# Patient Record
Sex: Female | Born: 1940
Health system: Southern US, Community
[De-identification: ages and names within clinical notes are randomized; demographics above are authoritative.]

## PROBLEM LIST (undated history)

## (undated) DIAGNOSIS — R0602 Shortness of breath: Secondary | ICD-10-CM

## (undated) DIAGNOSIS — I10 Essential (primary) hypertension: Secondary | ICD-10-CM

## (undated) DIAGNOSIS — K5900 Constipation, unspecified: Secondary | ICD-10-CM

## (undated) DIAGNOSIS — M1990 Unspecified osteoarthritis, unspecified site: Secondary | ICD-10-CM

## (undated) DIAGNOSIS — I499 Cardiac arrhythmia, unspecified: Secondary | ICD-10-CM

## (undated) DIAGNOSIS — Z96659 Presence of unspecified artificial knee joint: Secondary | ICD-10-CM

## (undated) HISTORY — PX: EYE SURGERY: SHX253

## (undated) HISTORY — PX: CHOLECYSTECTOMY: SHX55

## (undated) HISTORY — DX: Essential (primary) hypertension: I10

## (undated) HISTORY — PX: APPENDECTOMY: SHX54

## (undated) HISTORY — PX: TONSILLECTOMY: SUR1361

## (undated) HISTORY — PX: COLONOSCOPY: SHX174

## (undated) HISTORY — PX: CARPAL TUNNEL RELEASE: SHX101

## (undated) HISTORY — PX: OTHER SURGICAL HISTORY: SHX169

## (undated) HISTORY — DX: Presence of unspecified artificial knee joint: Z96.659

## (undated) HISTORY — DX: Cardiac arrhythmia, unspecified: I49.9

## (undated) HISTORY — DX: Unspecified osteoarthritis, unspecified site: M19.90

---

## 2011-07-19 DIAGNOSIS — M899 Disorder of bone, unspecified: Secondary | ICD-10-CM | POA: Diagnosis not present

## 2011-07-22 DIAGNOSIS — H43819 Vitreous degeneration, unspecified eye: Secondary | ICD-10-CM | POA: Diagnosis not present

## 2011-07-22 DIAGNOSIS — H2510 Age-related nuclear cataract, unspecified eye: Secondary | ICD-10-CM | POA: Diagnosis not present

## 2011-07-22 DIAGNOSIS — H25019 Cortical age-related cataract, unspecified eye: Secondary | ICD-10-CM | POA: Diagnosis not present

## 2011-07-22 DIAGNOSIS — H1045 Other chronic allergic conjunctivitis: Secondary | ICD-10-CM | POA: Diagnosis not present

## 2011-08-03 DIAGNOSIS — H25019 Cortical age-related cataract, unspecified eye: Secondary | ICD-10-CM | POA: Diagnosis not present

## 2011-08-03 DIAGNOSIS — H2510 Age-related nuclear cataract, unspecified eye: Secondary | ICD-10-CM | POA: Diagnosis not present

## 2011-08-03 DIAGNOSIS — H269 Unspecified cataract: Secondary | ICD-10-CM | POA: Diagnosis not present

## 2011-08-19 DIAGNOSIS — I10 Essential (primary) hypertension: Secondary | ICD-10-CM | POA: Diagnosis not present

## 2011-08-19 DIAGNOSIS — Z0000 Encounter for general adult medical examination without abnormal findings: Secondary | ICD-10-CM | POA: Diagnosis not present

## 2011-08-26 DIAGNOSIS — I10 Essential (primary) hypertension: Secondary | ICD-10-CM | POA: Diagnosis not present

## 2011-09-15 DIAGNOSIS — H524 Presbyopia: Secondary | ICD-10-CM | POA: Diagnosis not present

## 2011-12-10 DIAGNOSIS — Z1231 Encounter for screening mammogram for malignant neoplasm of breast: Secondary | ICD-10-CM | POA: Diagnosis not present

## 2012-01-06 DIAGNOSIS — H6980 Other specified disorders of Eustachian tube, unspecified ear: Secondary | ICD-10-CM | POA: Diagnosis not present

## 2012-01-06 DIAGNOSIS — R002 Palpitations: Secondary | ICD-10-CM | POA: Diagnosis not present

## 2012-01-11 DIAGNOSIS — R002 Palpitations: Secondary | ICD-10-CM | POA: Diagnosis not present

## 2012-01-14 DIAGNOSIS — R002 Palpitations: Secondary | ICD-10-CM | POA: Diagnosis not present

## 2012-01-21 DIAGNOSIS — R002 Palpitations: Secondary | ICD-10-CM | POA: Diagnosis not present

## 2012-01-21 DIAGNOSIS — I10 Essential (primary) hypertension: Secondary | ICD-10-CM | POA: Diagnosis not present

## 2012-02-17 DIAGNOSIS — Z23 Encounter for immunization: Secondary | ICD-10-CM | POA: Diagnosis not present

## 2012-02-17 DIAGNOSIS — R002 Palpitations: Secondary | ICD-10-CM | POA: Diagnosis not present

## 2012-02-17 DIAGNOSIS — I10 Essential (primary) hypertension: Secondary | ICD-10-CM | POA: Diagnosis not present

## 2012-02-29 DIAGNOSIS — M1610 Unilateral primary osteoarthritis, unspecified hip: Secondary | ICD-10-CM | POA: Diagnosis not present

## 2012-02-29 DIAGNOSIS — Z96649 Presence of unspecified artificial hip joint: Secondary | ICD-10-CM | POA: Diagnosis not present

## 2012-03-09 DIAGNOSIS — H2510 Age-related nuclear cataract, unspecified eye: Secondary | ICD-10-CM | POA: Diagnosis not present

## 2012-03-09 DIAGNOSIS — H25019 Cortical age-related cataract, unspecified eye: Secondary | ICD-10-CM | POA: Diagnosis not present

## 2012-03-15 DIAGNOSIS — R002 Palpitations: Secondary | ICD-10-CM | POA: Diagnosis not present

## 2012-03-15 DIAGNOSIS — R0602 Shortness of breath: Secondary | ICD-10-CM | POA: Diagnosis not present

## 2012-03-15 DIAGNOSIS — I4949 Other premature depolarization: Secondary | ICD-10-CM | POA: Diagnosis not present

## 2012-04-06 DIAGNOSIS — R0602 Shortness of breath: Secondary | ICD-10-CM | POA: Diagnosis not present

## 2012-05-06 DIAGNOSIS — M129 Arthropathy, unspecified: Secondary | ICD-10-CM | POA: Diagnosis not present

## 2012-05-06 DIAGNOSIS — R42 Dizziness and giddiness: Secondary | ICD-10-CM | POA: Diagnosis not present

## 2012-05-06 DIAGNOSIS — D649 Anemia, unspecified: Secondary | ICD-10-CM | POA: Diagnosis not present

## 2012-05-06 DIAGNOSIS — I10 Essential (primary) hypertension: Secondary | ICD-10-CM | POA: Diagnosis not present

## 2012-05-06 DIAGNOSIS — Z79899 Other long term (current) drug therapy: Secondary | ICD-10-CM | POA: Diagnosis not present

## 2012-11-23 DIAGNOSIS — I10 Essential (primary) hypertension: Secondary | ICD-10-CM | POA: Diagnosis not present

## 2013-02-27 DIAGNOSIS — Z23 Encounter for immunization: Secondary | ICD-10-CM | POA: Diagnosis not present

## 2013-02-27 DIAGNOSIS — IMO0002 Reserved for concepts with insufficient information to code with codable children: Secondary | ICD-10-CM | POA: Diagnosis not present

## 2013-02-27 DIAGNOSIS — I10 Essential (primary) hypertension: Secondary | ICD-10-CM | POA: Diagnosis not present

## 2013-02-27 DIAGNOSIS — Z0000 Encounter for general adult medical examination without abnormal findings: Secondary | ICD-10-CM | POA: Diagnosis not present

## 2013-02-27 DIAGNOSIS — I4949 Other premature depolarization: Secondary | ICD-10-CM | POA: Diagnosis not present

## 2013-02-27 DIAGNOSIS — M959 Acquired deformity of musculoskeletal system, unspecified: Secondary | ICD-10-CM | POA: Diagnosis not present

## 2013-02-27 DIAGNOSIS — M1710 Unilateral primary osteoarthritis, unspecified knee: Secondary | ICD-10-CM | POA: Diagnosis not present

## 2013-02-27 DIAGNOSIS — Z1239 Encounter for other screening for malignant neoplasm of breast: Secondary | ICD-10-CM | POA: Diagnosis not present

## 2013-03-06 DIAGNOSIS — M1710 Unilateral primary osteoarthritis, unspecified knee: Secondary | ICD-10-CM | POA: Diagnosis not present

## 2013-03-06 DIAGNOSIS — IMO0002 Reserved for concepts with insufficient information to code with codable children: Secondary | ICD-10-CM | POA: Diagnosis not present

## 2013-03-06 DIAGNOSIS — M25569 Pain in unspecified knee: Secondary | ICD-10-CM | POA: Diagnosis not present

## 2013-03-10 ENCOUNTER — Encounter: Payer: Self-pay | Admitting: *Deleted

## 2013-03-10 ENCOUNTER — Encounter: Payer: Self-pay | Admitting: Interventional Cardiology

## 2013-03-13 ENCOUNTER — Ambulatory Visit (INDEPENDENT_AMBULATORY_CARE_PROVIDER_SITE_OTHER): Payer: Medicare Other | Admitting: Interventional Cardiology

## 2013-03-13 ENCOUNTER — Encounter: Payer: Self-pay | Admitting: Interventional Cardiology

## 2013-03-13 VITALS — BP 142/80 | HR 64 | Ht 63.5 in | Wt 155.1 lb

## 2013-03-13 DIAGNOSIS — I10 Essential (primary) hypertension: Secondary | ICD-10-CM | POA: Diagnosis not present

## 2013-03-13 DIAGNOSIS — I493 Ventricular premature depolarization: Secondary | ICD-10-CM | POA: Insufficient documentation

## 2013-03-13 DIAGNOSIS — I498 Other specified cardiac arrhythmias: Secondary | ICD-10-CM

## 2013-03-13 DIAGNOSIS — I4949 Other premature depolarization: Secondary | ICD-10-CM | POA: Diagnosis not present

## 2013-03-13 DIAGNOSIS — Z01810 Encounter for preprocedural cardiovascular examination: Secondary | ICD-10-CM | POA: Diagnosis not present

## 2013-03-13 NOTE — Progress Notes (Signed)
Patient ID: Maria Rowland, female   DOB: 09/17/1940, 72 y.o.   MRN: [id number]    [address] [place], [place]  [address] Phone: [phone] Fax:  [phone]  Date:  03/13/2013   ID:  Maria Rowland, DOB [birth year], MRN [id number]  PCP:  No primary provider on file.      History of Present Illness: Maria Rowland is a 72 y.o. female who has had PVCs over the past many years. SHe has had lightheadedness intermittently. In August 2013, she had an episode while standing in line. She had been drinking a lot of coffee at the time. She has decreased caffeine intake since that time. She has less PVCs as well with less caffeine. She has had some SHOB as well. That comes on with heat and sudden exercise. Better if the weather is cool. No chest pain.  No sx of PVCs anymore , since stopping caffeine.  She walks a little without CP or SHOB.  She walks up stairs without any cardiac sx, but is limited most by joints.      Wt Readings from Last 3 Encounters:  03/13/13 155 lb 1.9 oz (70.362 kg)     Past Medical History  Diagnosis Date  . HTN (hypertension)   . Irregular heart beat   . Osteoarthritis     Current Outpatient Prescriptions  Medication Sig Dispense Refill  . Acetaminophen (TYLENOL ARTHRITIS EXT RELIEF PO) Take by mouth as needed.      . calcium-vitamin D 250-100 MG-UNIT per tablet Take 1 tablet by mouth 2 (two) times daily.      . metoprolol succinate (TOPROL-XL) 50 MG 24 hr tablet       . Multiple Vitamins-Minerals (CENTRUM SILVER ADULT 50+ PO) Take by mouth daily.      . psyllium (METAMUCIL) 58.6 % powder Take 1 packet by mouth 2 (two) times daily.       No current facility-administered medications for this visit.    Allergies:    Allergies  Allergen Reactions  . Mobic [Meloxicam]      transaminitis     Social History:  The patient  reports that she has never smoked. She does not have any smokeless tobacco history on file.   Family History:  The patient's  family history includes Arthritis in her brother; COPD in her brother; Cancer - Prostate in her father; Hypertension in her mother.   ROS:  Please see the history of present illness.  No nausea, vomiting.  No fevers, chills.  No focal weakness.  No dysuria.    All other systems reviewed and negative.   PHYSICAL EXAM: VS:  BP 142/80  Pulse 64  Ht 5' 3.5" (1.613 m)  Wt 155 lb 1.9 oz (70.362 kg)  BMI 27.04 kg/m2 Well nourished, well developed, in no acute distress HEENT: normal Neck: no JVD, no carotid bruits Cardiac:  normal S1, S2; RRR;  Lungs:  clear to auscultation bilaterally, no wheezing, rhonchi or rales Abd: soft, nontender, no hepatomegaly Ext: no edema Skin: warm and dry Neuro:   no focal abnormalities noted  EKG: Normal     ASSESSMENT AND PLAN:  1.  Preop evaluation:  Needs knee replacement.  ECHO from 11/13 was reviewed and was essentially normal.  No sx with walking up the stairs.  Form filled out.  No further cardiac testing needed prior to surgery. 2. PVCs : Reduced with less caffeine. 3.  PAT: documented on monitor.  No sustained sx.  COntinue  metoprolol during the perioperative period.    Signed, Fredric Mare, MD, [facility] 03/13/2013 10:27 AM

## 2013-03-13 NOTE — Patient Instructions (Signed)
Your physician wants you to follow-up in: 1 year follow up with Dr. Varanasi. You will receive a reminder letter in the mail two months in advance. If you don't receive a letter, please call our office to schedule the follow-up appointment.  Your physician recommends that you continue on your current medications as directed. Please refer to the Current Medication list given to you today.  

## 2013-03-22 DIAGNOSIS — H2510 Age-related nuclear cataract, unspecified eye: Secondary | ICD-10-CM | POA: Diagnosis not present

## 2013-03-22 DIAGNOSIS — H25019 Cortical age-related cataract, unspecified eye: Secondary | ICD-10-CM | POA: Diagnosis not present

## 2013-03-22 DIAGNOSIS — H43819 Vitreous degeneration, unspecified eye: Secondary | ICD-10-CM | POA: Diagnosis not present

## 2013-03-26 ENCOUNTER — Other Ambulatory Visit: Payer: Self-pay | Admitting: Orthopedic Surgery

## 2013-03-27 ENCOUNTER — Encounter (HOSPITAL_COMMUNITY): Payer: Self-pay | Admitting: Pharmacy Technician

## 2013-03-30 DIAGNOSIS — M1710 Unilateral primary osteoarthritis, unspecified knee: Secondary | ICD-10-CM | POA: Diagnosis not present

## 2013-03-30 DIAGNOSIS — IMO0002 Reserved for concepts with insufficient information to code with codable children: Secondary | ICD-10-CM | POA: Diagnosis not present

## 2013-04-03 ENCOUNTER — Encounter (HOSPITAL_COMMUNITY)
Admission: RE | Admit: 2013-04-03 | Discharge: 2013-04-03 | Disposition: A | Discharge status: Home / Self Care | Payer: Medicare Other | Source: Ambulatory Visit | Attending: Orthopedic Surgery | Admitting: Orthopedic Surgery

## 2013-04-03 ENCOUNTER — Other Ambulatory Visit (HOSPITAL_COMMUNITY): Payer: Self-pay | Admitting: *Deleted

## 2013-04-03 ENCOUNTER — Encounter (HOSPITAL_COMMUNITY): Payer: Self-pay

## 2013-04-03 DIAGNOSIS — Z01812 Encounter for preprocedural laboratory examination: Secondary | ICD-10-CM | POA: Diagnosis not present

## 2013-04-03 DIAGNOSIS — Z01818 Encounter for other preprocedural examination: Secondary | ICD-10-CM | POA: Diagnosis not present

## 2013-04-03 HISTORY — DX: Shortness of breath: R06.02

## 2013-04-03 HISTORY — DX: Constipation, unspecified: K59.00

## 2013-04-03 LAB — CBC WITH DIFFERENTIAL/PLATELET
Basophils Absolute: 0 K/uL (ref 0.0–0.1)
Basophils Relative: 1 % (ref 0–1)
Eosinophils Absolute: 0.2 K/uL (ref 0.0–0.7)
Eosinophils Relative: 3 % (ref 0–5)
HCT: 36.1 % (ref 36.0–46.0)
Hemoglobin: 12.4 g/dL (ref 12.0–15.0)
Lymphocytes Relative: 39 % (ref 12–46)
Lymphs Abs: 2.9 K/uL (ref 0.7–4.0)
MCH: 31.7 pg (ref 26.0–34.0)
MCHC: 34.3 g/dL (ref 30.0–36.0)
MCV: 92.3 fL (ref 78.0–100.0)
Monocytes Absolute: 0.6 K/uL (ref 0.1–1.0)
Monocytes Relative: 8 % (ref 3–12)
Neutro Abs: 3.7 K/uL (ref 1.7–7.7)
Neutrophils Relative %: 50 % (ref 43–77)
Platelets: 288 K/uL (ref 150–400)
RBC: 3.91 MIL/uL (ref 3.87–5.11)
RDW: 13.5 % (ref 11.5–15.5)
WBC: 7.5 K/uL (ref 4.0–10.5)

## 2013-04-03 LAB — URINALYSIS, ROUTINE W REFLEX MICROSCOPIC
Bilirubin Urine: NEGATIVE
Glucose, UA: NEGATIVE mg/dL
Hgb urine dipstick: NEGATIVE
Ketones, ur: NEGATIVE mg/dL
Leukocytes, UA: NEGATIVE
Nitrite: NEGATIVE
Protein, ur: NEGATIVE mg/dL
Specific Gravity, Urine: 1.006 (ref 1.005–1.030)
Urobilinogen, UA: 0.2 mg/dL (ref 0.0–1.0)
pH: 6 (ref 5.0–8.0)

## 2013-04-03 LAB — COMPREHENSIVE METABOLIC PANEL WITH GFR
ALT: 36 U/L — ABNORMAL HIGH (ref 0–35)
AST: 31 U/L (ref 0–37)
Albumin: 4 g/dL (ref 3.5–5.2)
Alkaline Phosphatase: 83 U/L (ref 39–117)
BUN: 17 mg/dL (ref 6–23)
CO2: 27 meq/L (ref 19–32)
Calcium: 9.5 mg/dL (ref 8.4–10.5)
Chloride: 100 meq/L (ref 96–112)
Creatinine, Ser: 0.8 mg/dL (ref 0.50–1.10)
GFR calc Af Amer: 84 mL/min — ABNORMAL LOW (ref >90)
GFR calc non Af Amer: 72 mL/min — ABNORMAL LOW (ref >90)
Glucose, Bld: 90 mg/dL (ref 70–99)
Potassium: 3.8 meq/L (ref 3.5–5.1)
Sodium: 137 meq/L (ref 135–145)
Total Bilirubin: 0.3 mg/dL (ref 0.3–1.2)
Total Protein: 7.2 g/dL (ref 6.0–8.3)

## 2013-04-03 LAB — TYPE AND SCREEN
ABO/RH(D): O POS
Antibody Screen: NEGATIVE

## 2013-04-03 LAB — PROTIME-INR
INR: 0.97 (ref 0.00–1.49)
Prothrombin Time: 12.7 seconds (ref 11.6–15.2)

## 2013-04-03 LAB — APTT: aPTT: 30 seconds (ref 24–37)

## 2013-04-03 LAB — SURGICAL PCR SCREEN
MRSA, PCR: NEGATIVE
Staphylococcus aureus: NEGATIVE

## 2013-04-03 LAB — ABO/RH: ABO/RH(D): O POS

## 2013-04-03 NOTE — Pre-Procedure Instructions (Signed)
Maria Rowland  04/03/2013   Your procedure is scheduled on:  Monday, April 09, 2013 at 8:45 AM.   Report to [facility] Entrance "A" at 6:45 AM.   Call this number if you have problems the morning of surgery: [phone]   Remember:   Do not eat food or drink liquids after midnight Sunday, 04/08/13.  Take these medicines the morning of surgery with A SIP OF WATER: metoprolol succinate (TOPROL-XL), acetaminophen (TYLENOL)  Stop all Vitamins as of today, 04/03/13.     Do not wear jewelry, make-up or nail polish.  Do not wear lotions, powders, or perfumes. You may wear deodorant.  Do not shave 48 hours prior to surgery.   Do not bring valuables to the hospital.  [facility] is not responsible                  for any belongings or valuables.               Contacts, dentures or bridgework may not be worn into surgery.  Leave suitcase in the car. After surgery it may be brought to your room.  For patients admitted to the hospital, discharge time is determined by your                treatment team.              Special Instructions: Shower using CHG 2 nights before surgery and the night before surgery.  If you shower the day of surgery use CHG.  Use special wash - you have one bottle of CHG for all showers.  You should use approximately 1/3 of the bottle for each shower.   Please read over the following fact sheets that you were given: Pain Booklet, Coughing and Deep Breathing, Blood Transfusion Information, MRSA Information and Surgical Site Infection Prevention

## 2013-04-08 MED ORDER — CEFAZOLIN SODIUM-DEXTROSE 2-3 GM-% IV SOLR
2.0000 g | INTRAVENOUS | Status: AC
  Administered 2013-04-09: 2 g via INTRAVENOUS
  Filled 2013-04-08: qty 50

## 2013-04-09 ENCOUNTER — Inpatient Hospital Stay (HOSPITAL_COMMUNITY)
Admission: RE | Admit: 2013-04-09 | Discharge: 2013-04-12 | DRG: 470 | Disposition: A | Discharge status: Skilled Nursing Facility | Payer: Medicare Other | Source: Ambulatory Visit | Attending: Orthopedic Surgery | Admitting: Orthopedic Surgery

## 2013-04-09 ENCOUNTER — Encounter (HOSPITAL_COMMUNITY)
Admission: RE | Disposition: A | Discharge status: Skilled Nursing Facility | Payer: Self-pay | Source: Ambulatory Visit | Attending: Orthopedic Surgery

## 2013-04-09 ENCOUNTER — Encounter (HOSPITAL_COMMUNITY): Payer: Medicare Other | Admitting: Anesthesiology

## 2013-04-09 ENCOUNTER — Encounter (HOSPITAL_COMMUNITY): Payer: Self-pay | Admitting: Surgery

## 2013-04-09 ENCOUNTER — Inpatient Hospital Stay (HOSPITAL_COMMUNITY): Payer: Medicare Other | Admitting: Anesthesiology

## 2013-04-09 DIAGNOSIS — Z8261 Family history of arthritis: Secondary | ICD-10-CM

## 2013-04-09 DIAGNOSIS — I10 Essential (primary) hypertension: Secondary | ICD-10-CM | POA: Diagnosis not present

## 2013-04-09 DIAGNOSIS — M1990 Unspecified osteoarthritis, unspecified site: Secondary | ICD-10-CM | POA: Diagnosis not present

## 2013-04-09 DIAGNOSIS — M6281 Muscle weakness (generalized): Secondary | ICD-10-CM | POA: Diagnosis not present

## 2013-04-09 DIAGNOSIS — M1710 Unilateral primary osteoarthritis, unspecified knee: Secondary | ICD-10-CM | POA: Diagnosis not present

## 2013-04-09 DIAGNOSIS — Z471 Aftercare following joint replacement surgery: Secondary | ICD-10-CM | POA: Diagnosis not present

## 2013-04-09 DIAGNOSIS — G8918 Other acute postprocedural pain: Secondary | ICD-10-CM | POA: Diagnosis not present

## 2013-04-09 DIAGNOSIS — I4891 Unspecified atrial fibrillation: Secondary | ICD-10-CM | POA: Diagnosis not present

## 2013-04-09 DIAGNOSIS — R269 Unspecified abnormalities of gait and mobility: Secondary | ICD-10-CM | POA: Diagnosis not present

## 2013-04-09 DIAGNOSIS — Z7901 Long term (current) use of anticoagulants: Secondary | ICD-10-CM

## 2013-04-09 DIAGNOSIS — Z96659 Presence of unspecified artificial knee joint: Secondary | ICD-10-CM | POA: Diagnosis not present

## 2013-04-09 DIAGNOSIS — Z8249 Family history of ischemic heart disease and other diseases of the circulatory system: Secondary | ICD-10-CM

## 2013-04-09 DIAGNOSIS — Z96649 Presence of unspecified artificial hip joint: Secondary | ICD-10-CM

## 2013-04-09 DIAGNOSIS — Z9089 Acquired absence of other organs: Secondary | ICD-10-CM

## 2013-04-09 DIAGNOSIS — IMO0002 Reserved for concepts with insufficient information to code with codable children: Secondary | ICD-10-CM | POA: Diagnosis not present

## 2013-04-09 DIAGNOSIS — Z8042 Family history of malignant neoplasm of prostate: Secondary | ICD-10-CM | POA: Diagnosis not present

## 2013-04-09 DIAGNOSIS — D62 Acute posthemorrhagic anemia: Secondary | ICD-10-CM | POA: Diagnosis not present

## 2013-04-09 DIAGNOSIS — Z79899 Other long term (current) drug therapy: Secondary | ICD-10-CM

## 2013-04-09 DIAGNOSIS — Z836 Family history of other diseases of the respiratory system: Secondary | ICD-10-CM | POA: Diagnosis not present

## 2013-04-09 DIAGNOSIS — Z96652 Presence of left artificial knee joint: Secondary | ICD-10-CM

## 2013-04-09 DIAGNOSIS — Z5189 Encounter for other specified aftercare: Secondary | ICD-10-CM | POA: Diagnosis not present

## 2013-04-09 DIAGNOSIS — M25569 Pain in unspecified knee: Secondary | ICD-10-CM | POA: Diagnosis not present

## 2013-04-09 HISTORY — PX: TOTAL KNEE ARTHROPLASTY: SHX125

## 2013-04-09 LAB — CBC
HCT: 30.3 % — ABNORMAL LOW (ref 36.0–46.0)
Hemoglobin: 10.9 g/dL — ABNORMAL LOW (ref 12.0–15.0)
MCH: 32.2 pg (ref 26.0–34.0)
MCHC: 36 g/dL (ref 30.0–36.0)
MCV: 89.6 fL (ref 78.0–100.0)
Platelets: 236 10*3/uL (ref 150–400)
RBC: 3.38 MIL/uL — ABNORMAL LOW (ref 3.87–5.11)
RDW: 13.2 % (ref 11.5–15.5)
WBC: 8.5 10*3/uL (ref 4.0–10.5)

## 2013-04-09 LAB — CREATININE, SERUM
Creatinine, Ser: 0.63 mg/dL (ref 0.50–1.10)
GFR calc Af Amer: >90 mL/min (ref >90)
GFR calc non Af Amer: 88 mL/min — ABNORMAL LOW (ref >90)

## 2013-04-09 SURGERY — ARTHROPLASTY, KNEE, TOTAL
Anesthesia: General | Site: Knee | Laterality: Right | Wound class: Clean

## 2013-04-09 MED ORDER — TRANEXAMIC ACID 100 MG/ML IV SOLN
1000.0000 mg | INTRAVENOUS | Status: AC
  Administered 2013-04-09: 1000 mg via INTRAVENOUS
  Filled 2013-04-09: qty 10

## 2013-04-09 MED ORDER — DOCUSATE SODIUM 100 MG PO CAPS
100.0000 mg | ORAL_CAPSULE | Freq: Two times a day (BID) | ORAL | Status: DC
  Administered 2013-04-09 – 2013-04-12 (×7): 100 mg via ORAL
  Filled 2013-04-09 (×8): qty 1

## 2013-04-09 MED ORDER — ALUM & MAG HYDROXIDE-SIMETH 200-200-20 MG/5ML PO SUSP: 30.0000 mL | ORAL | Status: DC | PRN

## 2013-04-09 MED ORDER — OXYCODONE HCL 5 MG PO TABS
ORAL_TABLET | ORAL | Status: AC
  Filled 2013-04-09: qty 1

## 2013-04-09 MED ORDER — METHOCARBAMOL 100 MG/ML IJ SOLN
500.0000 mg | Freq: Four times a day (QID) | INTRAVENOUS | Status: DC | PRN
  Filled 2013-04-09: qty 5

## 2013-04-09 MED ORDER — OXYCODONE HCL 5 MG PO TABS
5.0000 mg | ORAL_TABLET | ORAL | Status: DC | PRN
  Administered 2013-04-09 – 2013-04-12 (×14): 10 mg via ORAL
  Filled 2013-04-09 (×14): qty 2

## 2013-04-09 MED ORDER — DIPHENHYDRAMINE HCL 12.5 MG/5ML PO ELIX: 12.5000 mg | ORAL_SOLUTION | ORAL | Status: DC | PRN

## 2013-04-09 MED ORDER — METOPROLOL SUCCINATE ER 50 MG PO TB24
50.0000 mg | ORAL_TABLET | Freq: Every day | ORAL | Status: DC
  Administered 2013-04-10 – 2013-04-12 (×3): 50 mg via ORAL
  Filled 2013-04-09 (×3): qty 1

## 2013-04-09 MED ORDER — SODIUM CHLORIDE 0.9 % IV SOLN
INTRAVENOUS | Status: DC
  Administered 2013-04-09: 1 mL via INTRAVENOUS
  Administered 2013-04-10 – 2013-04-11 (×2): via INTRAVENOUS

## 2013-04-09 MED ORDER — ENOXAPARIN SODIUM 30 MG/0.3ML ~~LOC~~ SOLN
30.0000 mg | Freq: Two times a day (BID) | SUBCUTANEOUS | Status: DC
  Administered 2013-04-10 – 2013-04-12 (×5): 30 mg via SUBCUTANEOUS
  Filled 2013-04-09 (×7): qty 0.3

## 2013-04-09 MED ORDER — HYDROMORPHONE HCL PF 1 MG/ML IJ SOLN
1.0000 mg | INTRAMUSCULAR | Status: DC | PRN
  Administered 2013-04-10: 1 mg via INTRAVENOUS
  Filled 2013-04-09: qty 1

## 2013-04-09 MED ORDER — SENNOSIDES-DOCUSATE SODIUM 8.6-50 MG PO TABS: 1.0000 | ORAL_TABLET | Freq: Every evening | ORAL | Status: DC | PRN

## 2013-04-09 MED ORDER — MIDAZOLAM HCL 2 MG/2ML IJ SOLN: 2.0000 mg | Freq: Once | INTRAMUSCULAR | Status: DC

## 2013-04-09 MED ORDER — ACETAMINOPHEN 650 MG RE SUPP: 650.0000 mg | Freq: Four times a day (QID) | RECTAL | Status: DC | PRN

## 2013-04-09 MED ORDER — FENTANYL CITRATE 0.05 MG/ML IJ SOLN
50.0000 ug | Freq: Once | INTRAMUSCULAR | Status: AC
  Administered 2013-04-09: 50 ug via INTRAVENOUS

## 2013-04-09 MED ORDER — ACETAMINOPHEN 325 MG PO TABS
650.0000 mg | ORAL_TABLET | Freq: Four times a day (QID) | ORAL | Status: DC | PRN
  Administered 2013-04-11 – 2013-04-12 (×3): 650 mg via ORAL
  Filled 2013-04-09 (×3): qty 2

## 2013-04-09 MED ORDER — PROPOFOL 10 MG/ML IV BOLUS
INTRAVENOUS | Status: DC | PRN
  Administered 2013-04-09: 170 mg via INTRAVENOUS

## 2013-04-09 MED ORDER — CHLORHEXIDINE GLUCONATE 4 % EX LIQD: 60.0000 mL | Freq: Once | CUTANEOUS | Status: DC

## 2013-04-09 MED ORDER — MIDAZOLAM HCL 2 MG/2ML IJ SOLN
INTRAMUSCULAR | Status: AC
  Administered 2013-04-09: 2 mg
  Filled 2013-04-09: qty 2

## 2013-04-09 MED ORDER — ACETAMINOPHEN 500 MG PO TABS
1000.0000 mg | ORAL_TABLET | Freq: Four times a day (QID) | ORAL | Status: AC
  Administered 2013-04-09 – 2013-04-10 (×4): 1000 mg via ORAL
  Filled 2013-04-09 (×4): qty 2

## 2013-04-09 MED ORDER — PHENOL 1.4 % MT LIQD: 1.0000 | OROMUCOSAL | Status: DC | PRN

## 2013-04-09 MED ORDER — BISACODYL 5 MG PO TBEC: 5.0000 mg | DELAYED_RELEASE_TABLET | Freq: Every day | ORAL | Status: DC | PRN

## 2013-04-09 MED ORDER — FENTANYL CITRATE 0.05 MG/ML IJ SOLN
INTRAMUSCULAR | Status: AC
  Administered 2013-04-09: 50 ug via INTRAVENOUS
  Filled 2013-04-09: qty 2

## 2013-04-09 MED ORDER — BUPIVACAINE-EPINEPHRINE PF 0.5-1:200000 % IJ SOLN
INTRAMUSCULAR | Status: DC | PRN
  Administered 2013-04-09: 30 mL

## 2013-04-09 MED ORDER — BUPIVACAINE LIPOSOME 1.3 % IJ SUSP
20.0000 mL | Freq: Once | INTRAMUSCULAR | Status: DC
  Filled 2013-04-09: qty 20

## 2013-04-09 MED ORDER — LIDOCAINE HCL (CARDIAC) 20 MG/ML IV SOLN
INTRAVENOUS | Status: DC | PRN
  Administered 2013-04-09: 40 mg via INTRAVENOUS

## 2013-04-09 MED ORDER — ONDANSETRON HCL 4 MG PO TABS: 4.0000 mg | ORAL_TABLET | Freq: Four times a day (QID) | ORAL | Status: DC | PRN

## 2013-04-09 MED ORDER — SODIUM CHLORIDE 0.9 % IV SOLN: INTRAVENOUS | Status: DC

## 2013-04-09 MED ORDER — ONDANSETRON HCL 4 MG/2ML IJ SOLN
4.0000 mg | Freq: Four times a day (QID) | INTRAMUSCULAR | Status: DC | PRN
  Administered 2013-04-09 – 2013-04-10 (×2): 4 mg via INTRAVENOUS
  Filled 2013-04-09 (×2): qty 2

## 2013-04-09 MED ORDER — OXYCODONE HCL 5 MG PO TABS
5.0000 mg | ORAL_TABLET | Freq: Once | ORAL | Status: AC | PRN
  Administered 2013-04-09: 5 mg via ORAL

## 2013-04-09 MED ORDER — ZOLPIDEM TARTRATE 5 MG PO TABS: 5.0000 mg | ORAL_TABLET | Freq: Every evening | ORAL | Status: DC | PRN

## 2013-04-09 MED ORDER — OXYCODONE HCL ER 10 MG PO T12A
10.0000 mg | EXTENDED_RELEASE_TABLET | Freq: Two times a day (BID) | ORAL | Status: DC
  Administered 2013-04-09 – 2013-04-11 (×5): 10 mg via ORAL
  Filled 2013-04-09 (×5): qty 1

## 2013-04-09 MED ORDER — METOCLOPRAMIDE HCL 10 MG PO TABS: 5.0000 mg | ORAL_TABLET | Freq: Three times a day (TID) | ORAL | Status: DC | PRN

## 2013-04-09 MED ORDER — FLEET ENEMA 7-19 GM/118ML RE ENEM: 1.0000 | ENEMA | Freq: Once | RECTAL | Status: AC | PRN

## 2013-04-09 MED ORDER — MIDAZOLAM HCL 5 MG/5ML IJ SOLN
INTRAMUSCULAR | Status: DC | PRN
  Administered 2013-04-09: 1 mg via INTRAVENOUS

## 2013-04-09 MED ORDER — MENTHOL 3 MG MT LOZG: 1.0000 | LOZENGE | OROMUCOSAL | Status: DC | PRN

## 2013-04-09 MED ORDER — PHENYLEPHRINE HCL 10 MG/ML IJ SOLN
INTRAMUSCULAR | Status: DC | PRN
  Administered 2013-04-09: 80 ug via INTRAVENOUS
  Administered 2013-04-09 (×2): 40 ug via INTRAVENOUS
  Administered 2013-04-09 (×3): 80 ug via INTRAVENOUS

## 2013-04-09 MED ORDER — BUPIVACAINE LIPOSOME 1.3 % IJ SUSP
INTRAMUSCULAR | Status: DC | PRN
  Administered 2013-04-09: 20 mL

## 2013-04-09 MED ORDER — OXYCODONE HCL 5 MG/5ML PO SOLN
5.0000 mg | Freq: Once | ORAL | Status: AC | PRN
Start: 2013-04-09 — End: 2013-04-09

## 2013-04-09 MED ORDER — HYDROMORPHONE HCL PF 1 MG/ML IJ SOLN
INTRAMUSCULAR | Status: AC
  Filled 2013-04-09: qty 1

## 2013-04-09 MED ORDER — CEFAZOLIN SODIUM 1-5 GM-% IV SOLN
1.0000 g | Freq: Four times a day (QID) | INTRAVENOUS | Status: AC
  Administered 2013-04-09 (×2): 1 g via INTRAVENOUS
  Filled 2013-04-09 (×3): qty 50

## 2013-04-09 MED ORDER — FENTANYL CITRATE 0.05 MG/ML IJ SOLN
INTRAMUSCULAR | Status: DC | PRN
  Administered 2013-04-09: 50 ug via INTRAVENOUS
  Administered 2013-04-09: 100 ug via INTRAVENOUS

## 2013-04-09 MED ORDER — METHOCARBAMOL 500 MG PO TABS
500.0000 mg | ORAL_TABLET | Freq: Four times a day (QID) | ORAL | Status: DC | PRN
  Administered 2013-04-09 – 2013-04-11 (×5): 500 mg via ORAL
  Filled 2013-04-09 (×5): qty 1

## 2013-04-09 MED ORDER — ONDANSETRON HCL 4 MG/2ML IJ SOLN: 4.0000 mg | Freq: Once | INTRAMUSCULAR | Status: DC | PRN

## 2013-04-09 MED ORDER — MEPERIDINE HCL 25 MG/ML IJ SOLN
INTRAMUSCULAR | Status: AC
  Filled 2013-04-09: qty 1

## 2013-04-09 MED ORDER — LACTATED RINGERS IV SOLN
INTRAVENOUS | Status: DC
  Administered 2013-04-09 (×2): via INTRAVENOUS

## 2013-04-09 MED ORDER — HYDROMORPHONE HCL PF 1 MG/ML IJ SOLN
0.2500 mg | INTRAMUSCULAR | Status: DC | PRN
  Administered 2013-04-09 (×4): 0.5 mg via INTRAVENOUS

## 2013-04-09 MED ORDER — SODIUM CHLORIDE 0.9 % IR SOLN
Status: DC | PRN
  Administered 2013-04-09: 3000 mL

## 2013-04-09 MED ORDER — BUPIVACAINE-EPINEPHRINE (PF) 0.5% -1:200000 IJ SOLN
INTRAMUSCULAR | Status: AC
  Filled 2013-04-09: qty 10

## 2013-04-09 MED ORDER — ONDANSETRON HCL 4 MG/2ML IJ SOLN
INTRAMUSCULAR | Status: DC | PRN
  Administered 2013-04-09: 4 mg via INTRAVENOUS

## 2013-04-09 MED ORDER — METOCLOPRAMIDE HCL 5 MG/ML IJ SOLN
5.0000 mg | Freq: Three times a day (TID) | INTRAMUSCULAR | Status: DC | PRN
  Administered 2013-04-10: 10 mg via INTRAVENOUS
  Filled 2013-04-09: qty 2

## 2013-04-09 MED ORDER — MEPERIDINE HCL 25 MG/ML IJ SOLN
6.2500 mg | INTRAMUSCULAR | Status: DC | PRN
  Administered 2013-04-09 (×2): 6.25 mg via INTRAVENOUS

## 2013-04-09 SURGICAL SUPPLY — 54 items
BANDAGE ESMARK 6X9 LF (GAUZE/BANDAGES/DRESSINGS) ×1 IMPLANT
BLADE SAGITTAL 13X1.27X60 (BLADE) ×2 IMPLANT
BLADE SAW SGTL 83.5X18.5 (BLADE) ×2 IMPLANT
BNDG ESMARK 6X9 LF (GAUZE/BANDAGES/DRESSINGS) ×2
BOWL SMART MIX CTS (DISPOSABLE) ×2 IMPLANT
CAP POR TM CP VIT E LN CER HD ×2 IMPLANT
CEMENT BONE SIMPLEX SPEEDSET (Cement) ×4 IMPLANT
CLOTH BEACON ORANGE TIMEOUT ST (SAFETY) ×2 IMPLANT
COVER SURGICAL LIGHT HANDLE (MISCELLANEOUS) ×2 IMPLANT
CUFF TOURNIQUET SINGLE 34IN LL (TOURNIQUET CUFF) ×2 IMPLANT
DRAPE EXTREMITY T 121X128X90 (DRAPE) ×2 IMPLANT
DRAPE INCISE IOBAN 66X45 STRL (DRAPES) ×4 IMPLANT
DRAPE PROXIMA HALF (DRAPES) ×2 IMPLANT
DRAPE U-SHAPE 47X51 STRL (DRAPES) ×2 IMPLANT
DRSG ADAPTIC 3X8 NADH LF (GAUZE/BANDAGES/DRESSINGS) ×2 IMPLANT
DRSG PAD ABDOMINAL 8X10 ST (GAUZE/BANDAGES/DRESSINGS) ×2 IMPLANT
DURAPREP 26ML APPLICATOR (WOUND CARE) ×4 IMPLANT
ELECT REM PT RETURN 9FT ADLT (ELECTROSURGICAL) ×2
ELECTRODE REM PT RTRN 9FT ADLT (ELECTROSURGICAL) ×1 IMPLANT
EVACUATOR 1/8 PVC DRAIN (DRAIN) ×2 IMPLANT
GLOVE BIOGEL M 7.0 STRL (GLOVE) IMPLANT
GLOVE BIOGEL PI IND STRL 7.5 (GLOVE) IMPLANT
GLOVE BIOGEL PI IND STRL 8.5 (GLOVE) ×2 IMPLANT
GLOVE BIOGEL PI INDICATOR 7.5 (GLOVE)
GLOVE BIOGEL PI INDICATOR 8.5 (GLOVE) ×2
GLOVE SURG ORTHO 8.0 STRL STRW (GLOVE) ×4 IMPLANT
GOWN PREVENTION PLUS XLARGE (GOWN DISPOSABLE) ×4 IMPLANT
GOWN STRL NON-REIN LRG LVL3 (GOWN DISPOSABLE) ×4 IMPLANT
HANDPIECE INTERPULSE COAX TIP (DISPOSABLE) ×1
HOOD PEEL AWAY FACE SHEILD DIS (HOOD) ×8 IMPLANT
KIT BASIN OR (CUSTOM PROCEDURE TRAY) ×2 IMPLANT
KIT ROOM TURNOVER OR (KITS) ×2 IMPLANT
MANIFOLD NEPTUNE II (INSTRUMENTS) ×2 IMPLANT
NEEDLE 22X1 1/2 (OR ONLY) (NEEDLE) ×2 IMPLANT
NEEDLE HYPO 21X1.5 SAFETY (NEEDLE) ×2 IMPLANT
NS IRRIG 1000ML POUR BTL (IV SOLUTION) ×2 IMPLANT
PACK TOTAL JOINT (CUSTOM PROCEDURE TRAY) ×2 IMPLANT
PAD ARMBOARD 7.5X6 YLW CONV (MISCELLANEOUS) ×4 IMPLANT
PADDING CAST COTTON 6X4 STRL (CAST SUPPLIES) ×2 IMPLANT
SET HNDPC FAN SPRY TIP SCT (DISPOSABLE) ×1 IMPLANT
SPONGE GAUZE 4X4 12PLY (GAUZE/BANDAGES/DRESSINGS) ×2 IMPLANT
STAPLER VISISTAT 35W (STAPLE) ×2 IMPLANT
SUCTION FRAZIER TIP 10 FR DISP (SUCTIONS) ×2 IMPLANT
SUT BONE WAX W31G (SUTURE) ×2 IMPLANT
SUT VIC AB 0 CTB1 27 (SUTURE) ×4 IMPLANT
SUT VIC AB 1 CT1 27 (SUTURE) ×2
SUT VIC AB 1 CT1 27XBRD ANBCTR (SUTURE) ×2 IMPLANT
SUT VIC AB 2-0 CT1 27 (SUTURE) ×2
SUT VIC AB 2-0 CT1 TAPERPNT 27 (SUTURE) ×2 IMPLANT
SYR CONTROL 10ML LL (SYRINGE) ×2 IMPLANT
TOWEL OR 17X24 6PK STRL BLUE (TOWEL DISPOSABLE) ×2 IMPLANT
TOWEL OR 17X26 10 PK STRL BLUE (TOWEL DISPOSABLE) ×2 IMPLANT
TRAY FOLEY CATH 16FRSI W/METER (SET/KITS/TRAYS/PACK) ×2 IMPLANT
WATER STERILE IRR 1000ML POUR (IV SOLUTION) ×4 IMPLANT

## 2013-04-09 NOTE — Progress Notes (Signed)
Orthopedic Tech Progress Note Patient Details:  Maria Rowland February 04, 1941 [id number] CPM applied to Right LE with appropriate settings. OHF applied to bed. Footsie roll provided. CPM Right Knee CPM Right Knee: On Right Knee Flexion (Degrees): 90 Right Knee Extension (Degrees): 0   Asia R Thompson 04/09/2013, 11:55 AM

## 2013-04-09 NOTE — Anesthesia Postprocedure Evaluation (Signed)
  Anesthesia Post-op Note  Patient: Anthony Sar  Procedure(s) Performed: Procedure(s): TOTAL KNEE ARTHROPLASTY (Right)  Patient Location: PACU  Anesthesia Type:General and GA combined with regional for post-op pain  Level of Consciousness: awake, alert  and oriented  Airway and Oxygen Therapy: Patient Spontanous Breathing and Patient connected to nasal cannula oxygen  Post-op Pain: mild  Post-op Assessment: Post-op Vital signs reviewed, Patient's Cardiovascular Status Stable, Respiratory Function Stable, Patent Airway and Pain level controlled  Post-op Vital Signs: stable  Complications: No apparent anesthesia complications

## 2013-04-09 NOTE — H&P (Signed)
Maria Rowland MRN:  [id number] DOB/SEX:  1940-10-16/female  CHIEF COMPLAINT:  Painful right Knee  HISTORY: Patient is a 72 y.o. female presented with a history of pain in the right knee. Onset of symptoms was gradual starting several years ago with gradually worsening course since that time. Prior procedures on the knee include none. Patient has been treated conservatively with over-the-counter NSAIDs and activity modification. Patient currently rates pain in the knee at 9 out of 10 with activity. There is pain at night.  PAST MEDICAL HISTORY: Patient Active Problem List   Diagnosis Date Noted  . PVC (premature ventricular contraction) 03/13/2013  . Other specified cardiac dysrhythmias(427.89) 03/13/2013   Past Medical History  Diagnosis Date  . HTN (hypertension)   . Irregular heart beat   . Osteoarthritis   . Shortness of breath     when she has palpitations  . Constipation    Past Surgical History  Procedure Laterality Date  . Cholecystectomy    . Carpal tunnel release    . Hip replacement 2011    . Surgical repair of left hand    . Appendectomy    . Tonsillectomy    . Colonoscopy    . Eye surgery Right     cataract with lens implant     MEDICATIONS:   Prescriptions prior to admission  Medication Sig Dispense Refill  . acetaminophen (TYLENOL) 650 MG CR tablet Take 650 mg by mouth 2 (two) times daily.      . metoprolol succinate (TOPROL-XL) 50 MG 24 hr tablet Take 50 mg by mouth daily.       . Multiple Minerals-Vitamins (CALCIUM & VIT D3 BONE HEALTH PO) Take 1 tablet by mouth 2 (two) times daily.      . Multiple Vitamins-Minerals (CENTRUM SILVER ADULT 50+ PO) Take by mouth daily.      . Psyllium (METAMUCIL PO) Take 1 application by mouth 2 (two) times daily.        ALLERGIES:   Allergies  Allergen Reactions  . Mobic [Meloxicam] Other (See Comments)    Transaminitis   . Caffeine Palpitations    Rapid heartrate    REVIEW OF SYSTEMS:  Pertinent items are noted  in HPI.   FAMILY HISTORY:   Family History  Problem Relation Age of Onset  . Cancer - Prostate Father   . Hypertension Mother   . Arthritis Brother   . COPD Brother     SOCIAL HISTORY:   History  Substance Use Topics  . Smoking status: Never Smoker   . Smokeless tobacco: Never Used  . Alcohol Use: No     EXAMINATION:  Vital signs in last 24 hours:    General appearance: alert, cooperative and no distress Lungs: clear to auscultation bilaterally Heart: regular rate and rhythm, S1, S2 normal, no murmur, click, rub or gallop Abdomen: soft, non-tender; bowel sounds normal; no masses,  no organomegaly Extremities: extremities normal, atraumatic, no cyanosis or edema and Homans sign is negative, no sign of DVT Pulses: 2+ and symmetric Skin: Skin color, texture, turgor normal. No rashes or lesions Neurologic: Alert and oriented X 3, normal strength and tone. Normal symmetric reflexes. Normal coordination and gait  Musculoskeletal:  ROM 0-115, Ligaments intact,  Imaging Review Plain radiographs demonstrate severe degenerative joint disease of the right knee. The overall alignment is mild valgus. The bone quality appears to be good for age and reported activity level.  Assessment/Plan: End stage arthritis, right knee   The patient history, physical examination  and imaging studies are consistent with advanced degenerative joint disease of the right knee. The patient has failed conservative treatment.  The clearance notes were reviewed.  After discussion with the patient it was felt that Total Knee Replacement was indicated. The procedure,  risks, and benefits of total knee arthroplasty were presented and reviewed. The risks including but not limited to aseptic loosening, infection, blood clots, vascular injury, stiffness, patella tracking problems complications among others were discussed. The patient acknowledged the explanation, agreed to proceed with the  plan.  Maria Rowland 04/09/2013, 6:56 AM

## 2013-04-09 NOTE — Progress Notes (Signed)
UR COMPLETED  

## 2013-04-09 NOTE — Anesthesia Procedure Notes (Addendum)
Anesthesia Regional Block:  Adductor canal block  Pre-Anesthetic Checklist: ,, timeout performed, Correct Patient, Correct Site, Correct Laterality, Correct Procedure, Correct Position, site marked, Risks and benefits discussed,  Surgical consent,  Pre-op evaluation,  At surgeon's request and post-op pain management  Laterality: Right  Prep: chloraprep       Needles:   Needle Type: Echogenic Stimulator Needle      Needle Gauge: 22 and 22 G    Additional Needles:  Procedures: ultrasound guided (picture in chart) Adductor canal block Narrative:  Start time: 04/09/2013 7:30 AM End time: 04/09/2013 7:50 AM Injection made incrementally with aspirations every 5 mL.  Performed by: Personally   Additional Notes: 25 cc 0.5% marcaine 1:200 Epi  Adductor canal block Procedure Name: LMA Insertion Date/Time: 04/09/2013 9:12 AM Performed by: Marena Chancy Pre-anesthesia Checklist: Patient identified, Patient being monitored, Emergency Drugs available, Timeout performed and Suction available Patient Re-evaluated:Patient Re-evaluated prior to inductionOxygen Delivery Method: Circle system utilized Preoxygenation: Pre-oxygenation with 100% oxygen Intubation Type: IV induction LMA: LMA inserted LMA Size: 4.0 Number of attempts: 1 Placement Confirmation: breath sounds checked- equal and bilateral and positive ETCO2 Tube secured with: Tape Dental Injury: Teeth and Oropharynx as per pre-operative assessment

## 2013-04-09 NOTE — Transfer of Care (Signed)
Immediate Anesthesia Transfer of Care Note  Patient: Maria Rowland  Procedure(s) Performed: Procedure(s): TOTAL KNEE ARTHROPLASTY (Right)  Patient Location: PACU  Anesthesia Type:General  Level of Consciousness: awake, alert  and oriented  Airway & Oxygen Therapy: Patient Spontanous Breathing and Patient connected to nasal cannula oxygen  Post-op Assessment: Report given to PACU RN and Post -op Vital signs reviewed and stable  Post vital signs: Reviewed and stable  Complications: No apparent anesthesia complications

## 2013-04-09 NOTE — Anesthesia Preprocedure Evaluation (Signed)
Anesthesia Evaluation  Patient identified by MRN, date of birth, ID band Patient awake    Reviewed: Allergy & Precautions, H&P , NPO status , Patient's Chart, lab work & pertinent test results, reviewed documented beta blocker date and time   Airway Mallampati: II TM Distance: >3 FB Neck ROM: Full    Dental  (+) Teeth Intact and Dental Advisory Given   Pulmonary  breath sounds clear to auscultation        Cardiovascular hypertension, Rhythm:Regular Rate:Normal     Neuro/Psych    GI/Hepatic   Endo/Other    Renal/GU      Musculoskeletal   Abdominal   Peds  Hematology   Anesthesia Other Findings   Reproductive/Obstetrics                           Anesthesia Physical Anesthesia Plan  ASA: II  Anesthesia Plan: General   Post-op Pain Management:    Induction: Intravenous  Airway Management Planned: LMA  Additional Equipment:   Intra-op Plan:   Post-operative Plan: Extubation in OR  Informed Consent: I have reviewed the patients History and Physical, chart, labs and discussed the procedure including the risks, benefits and alternatives for the proposed anesthesia with the patient or authorized representative who has indicated his/her understanding and acceptance.   Dental advisory given  Plan Discussed with: CRNA and Anesthesiologist  Anesthesia Plan Comments: (DJD R. Knee Htn H/O PVCs normal ECHO good exercise tolerance  Plan GA with oral ETT  Kipp Brood, MD)        Anesthesia Quick Evaluation

## 2013-04-09 NOTE — Preoperative (Signed)
Beta Blockers   Reason not to administer Beta Blockers:received toprol today 

## 2013-04-09 NOTE — Evaluation (Addendum)
Physical Therapy Evaluation Patient Details Name: Maria Rowland MRN: [id number] DOB: Aug 25, 1940 Today's Date: 04/09/2013 Time: 4098-1191 PT Time Calculation (min): 48 min  PT Assessment / Plan / Recommendation History of Present Illness  s/p RTKA  Clinical Impression  Pt is s/p TKA resulting in the deficits listed below (see PT Problem List).  Pt will benefit from skilled PT to increase their independence and safety with mobility to allow discharge to the venue listed below.   Lengthy discussion with pt's daughter, Maria Rowland re: what to expect with acute PT, and projected recovery; While we were unable to get OOB today due to dizziness, I expect good progress, and dc tomorrow afternoon is not unreasonable.     PT Assessment  Patient needs continued PT services    Follow Up Recommendations  Home health PT;Supervision/Assistance - 24 hour    Does the patient have the potential to tolerate intense rehabilitation      Barriers to Discharge        Equipment Recommendations  Rolling walker with 5" wheels (?pt's RW may be quite old -- daughter is bringing it in)    Recommendations for Other Services     Frequency 7X/week    Precautions / Restrictions Precautions Precautions: Knee Restrictions Weight Bearing Restrictions: Yes RLE Weight Bearing: Weight bearing as tolerated   Pertinent Vitals/Pain 6/10 R knee; patient repositioned for comfort in CPM      Mobility  Bed Mobility Bed Mobility: Supine to Sit;Sitting - Scoot to [place] of Bed;Sit to Supine Supine to Sit: 4: Min assist;With rails Sitting - Scoot to [place] of Bed: 4: Min assist;With rail Sit to Supine: 4: Min assist;With rail Details for Bed Mobility Assistance: Cues for technique; Assist for RLE, but pt did show good knee control Transfers Transfers: Not assessed (reported incr dizziness sitting EOB)    Exercises Total Joint Exercises Quad Sets: AROM;Right;5 reps Straight Leg Raises: AROM;Right;5 reps   PT Diagnosis:  Difficulty walking;Acute pain  PT Problem List: Decreased strength;Decreased range of motion;Decreased activity tolerance;Decreased mobility;Decreased knowledge of use of DME;Decreased knowledge of precautions;Pain PT Treatment Interventions: DME instruction;Gait training;Stair training;Functional mobility training;Therapeutic activities;Therapeutic exercise;Patient/family education     PT Goals(Current goals can be found in the care plan section) Acute Rehab PT Goals Patient Stated Goal: walk PT Goal Formulation: With patient Time For Goal Achievement: 04/16/13 Potential to Achieve Goals: Good  Visit Information  Last PT Received On: 04/09/13 Assistance Needed: +1 History of Present Illness: s/p RTKA       Prior Functioning  Home Living Family/patient expects to be discharged to:: Private residence Living Arrangements: Children Available Help at Discharge: Family;Available 24 hours/day (for first 2 days) Type of Home: House Home Access: Stairs to enter [employer] of Steps: 2 Entrance Stairs-Rails: None Home Layout: Multi-level;Able to live on main level with bedroom/bathroom Alternate Level Stairs-Number of Steps: flight Home Equipment: Walker - 2 wheels;Bedside commode (RW may be quite old) Prior Function Level of Independence: Independent Communication Communication: No difficulties    Cognition  Cognition Arousal/Alertness: Awake/alert Behavior During Therapy: WFL for tasks assessed/performed Overall Cognitive Status: Within Functional Limits for tasks assessed    Extremity/Trunk Assessment Upper Extremity Assessment Upper Extremity Assessment: Overall WFL for tasks assessed Lower Extremity Assessment Lower Extremity Assessment: RLE deficits/detail RLE Deficits / Details: Decr AROM and strength, limited by pain postop; good quad activation; Able to perform straight leg raise actively, with some, though minimal quad lag   Balance Balance Balance  Assessed: Yes Static Sitting Balance Static  Sitting - Balance Support: Right upper extremity supported;Left upper extremity supported;Feet supported Static Sitting - Level of Assistance: 5: Stand by assistance Static Sitting - Comment/# of Minutes: Sat EOB at least 10 minutes, then reported dizziness which worsened; Assisted pt back to supine  End of Session PT - End of Session Activity Tolerance: Other (comment) (Limited by dizziness with upright sitting) Patient left: in bed;in CPM;with call bell/phone within reach;with family/visitor present;with nursing/sitter in room Nurse Communication: Mobility status CPM Right Knee CPM Right Knee: On Right Knee Flexion (Degrees): 90 Right Knee Extension (Degrees): 0  GP     Van Clines [place], [place] [phone]  04/09/2013, 4:01 PM

## 2013-04-10 LAB — BASIC METABOLIC PANEL
BUN: 7 mg/dL (ref 6–23)
CO2: 26 mEq/L (ref 19–32)
Calcium: 8.3 mg/dL — ABNORMAL LOW (ref 8.4–10.5)
Chloride: 103 mEq/L (ref 96–112)
Creatinine, Ser: 0.62 mg/dL (ref 0.50–1.10)
GFR calc Af Amer: >90 mL/min (ref >90)
GFR calc non Af Amer: 89 mL/min — ABNORMAL LOW (ref >90)
Glucose, Bld: 106 mg/dL — ABNORMAL HIGH (ref 70–99)
Potassium: 3.7 mEq/L (ref 3.5–5.1)
Sodium: 136 mEq/L (ref 135–145)

## 2013-04-10 LAB — CBC
HCT: 28.2 % — ABNORMAL LOW (ref 36.0–46.0)
Hemoglobin: 10 g/dL — ABNORMAL LOW (ref 12.0–15.0)
MCH: 32.5 pg (ref 26.0–34.0)
MCHC: 35.5 g/dL (ref 30.0–36.0)
MCV: 91.6 fL (ref 78.0–100.0)
Platelets: 203 10*3/uL (ref 150–400)
RBC: 3.08 MIL/uL — ABNORMAL LOW (ref 3.87–5.11)
RDW: 13.4 % (ref 11.5–15.5)
WBC: 6.5 10*3/uL (ref 4.0–10.5)

## 2013-04-10 MED ORDER — MORPHINE SULFATE 2 MG/ML IJ SOLN: 2.0000 mg | INTRAMUSCULAR | Status: DC | PRN

## 2013-04-10 NOTE — Progress Notes (Signed)
SPORTS MEDICINE AND JOINT REPLACEMENT  Georgena Spurling, MD   Altamese Cabal, PA-C [address] [place], [place], [place]  [address]                             [phone]   PROGRESS NOTE  Subjective:  negative for Chest Pain  negative for Shortness of Breath  negative for Nausea/Vomiting   negative for Calf Pain  negative for Bowel Movement   Tolerating Diet: yes         Patient reports pain as 6 on 0-10 scale.    Objective: Vital signs in last 24 hours:   Patient Vitals for the past 24 hrs:  BP Temp Temp src Pulse Resp SpO2 Height Weight  04/10/13 0600 134/59 mmHg 97.3 F (36.3 C) - 69 18 100 % - -  04/10/13 0200 116/55 mmHg 98.6 F (37 C) - 66 16 97 % - -  04/09/13 2042 147/56 mmHg 98.4 F (36.9 C) - 70 16 96 % - -  04/09/13 1812 138/55 mmHg 98.9 F (37.2 C) - 56 16 99 % - -  04/09/13 1556 - - - - 15 99 % 5' 3.5" (1.613 m) 71.215 kg (157 lb)  04/09/13 1400 147/53 mmHg 99.1 F (37.3 C) Oral 62 - 99 % - -  04/09/13 1344 133/63 mmHg 98.6 F (37 C) - 59 15 97 % - -  04/09/13 1330 152/61 mmHg - - 28 11 98 % - -  04/09/13 1315 140/60 mmHg - - 72 10 100 % - -  04/09/13 1307 140/60 mmHg - - - - - - -  04/09/13 1300 - - - 71 13 100 % - -  04/09/13 1252 148/74 mmHg - - - - - - -  04/09/13 1245 - - - 124 11 100 % - -  04/09/13 1237 148/59 mmHg - - - - - - -  04/09/13 1230 - - - 64 11 100 % - -  04/09/13 1222 127/89 mmHg - - - - - - -  04/09/13 1215 - - - 70 10 100 % - -  04/09/13 1208 150/80 mmHg - - - - - - -  04/09/13 1200 153/80 mmHg - - 70 9 100 % - -  04/09/13 1152 153/80 mmHg - - - - - - -  04/09/13 1145 162/83 mmHg - - - - - - -  04/09/13 1115 188/171 mmHg - - - - - - -  04/09/13 1106 159/103 mmHg 97.7 F (36.5 C) - 78 20 100 % - -  04/09/13 0809 147/69 mmHg - - 75 25 100 % - -  04/09/13 0805 157/65 mmHg - - 81 20 100 % - -  04/09/13 0800 160/51 mmHg - - 80 19 100 % - -  04/09/13 0754 150/103 mmHg - - 88 20 100 % - -  04/09/13 0750 - - - 81 19 100 % - -   04/09/13 0749 160/70 mmHg - - 89 20 100 % - -  04/09/13 0746 - - - 84 20 93 % - -  04/09/13 0745 123/68 mmHg - - 145 15 97 % - -  04/09/13 0740 158/87 mmHg - - 66 10 100 % - -    @flow {1959:LAST@   Intake/Output from previous day:   11/17 0701 - 11/18 0700 In: 2517.5 [P.O.:240; I.V.:2277.5] Out: 3225 [Urine:3000; Drains:225]   Intake/Output this shift:       Intake/Output  11/17 0701 - 11/18 0700 11/18 0701 - 11/19 0700   P.O. 240    I.V. (mL/kg) 2277.5 (32)    Total Intake(mL/kg) 2517.5 (35.4)    Urine (mL/kg/hr) 3000 (1.8)    Drains 225 (0.1)    Total Output 3225     Net -707.5             LABORATORY DATA:  Recent Labs  04/03/13 1442 04/09/13 1515  WBC 7.5 8.5  HGB 12.4 10.9*  HCT 36.1 30.3*  PLT 288 236    Recent Labs  04/03/13 1442 04/09/13 1515  NA 137  --   K 3.8  --   CL 100  --   CO2 27  --   BUN 17  --   CREATININE 0.80 0.63  GLUCOSE 90  --   CALCIUM 9.5  --    Lab Results  Component Value Date   INR 0.97 04/03/2013    Examination:  General appearance: alert, cooperative and no distress Extremities: Homans sign is negative, no sign of DVT  Wound Exam: clean, dry, intact   Drainage:  None: wound tissue dry  Motor Exam: EHL and FHL Intact  Sensory Exam: Deep Peroneal normal   Assessment:    1 Day Post-Op  Procedure(s) (LRB): TOTAL KNEE ARTHROPLASTY (Right)  ADDITIONAL DIAGNOSIS:  Active Problems:   * No active hospital problems. *  Acute Blood Loss Anemia   Plan: Physical Therapy as ordered Weight Bearing as Tolerated (WBAT)  DVT Prophylaxis:  Lovenox  DISCHARGE PLAN: Home  DISCHARGE NEEDS: HHPT, CPM, Walker and 3-in-1 comode seat         Mireille Lacombe 04/10/2013, 7:30 AM

## 2013-04-10 NOTE — Progress Notes (Signed)
04/10/13 Set up with HHPT with [facility] by MD office. [employer] providing CPM, rolling walker and 3N1. Patient having  a lot of pain, groggy, did not work well with PT/OT today. Will follow for d/c needs. Jacquelynn Cree RN, BSN, CCM

## 2013-04-10 NOTE — Progress Notes (Signed)
Physical Therapy Treatment Patient Details Name: Maria Rowland MRN: [id number] DOB: April 14, 1941 Today's Date: 04/10/2013 Time: 4098-1191 PT Time Calculation (min): 25 min  PT Assessment / Plan / Recommendation  History of Present Illness s/p RTKA   PT Comments   Slower progress than expected, with pt limited by pain and nausea; Not sure that pt will be able to dc safely today -- she'll need to make a lot of progress with activity tolerance; will continue to assess for readiness for dc; if unable to dc today, will likely be able to dc tomorrow   Follow Up Recommendations  Home health PT;Supervision/Assistance - 24 hour     Does the patient have the potential to tolerate intense rehabilitation     Barriers to Discharge        Equipment Recommendations  Rolling walker with 5" wheels    Recommendations for Other Services    Frequency 7X/week   Progress towards PT Goals Progress towards PT goals: Progressing toward goals  Plan Current plan remains appropriate    Precautions / Restrictions Precautions Precautions: Knee Restrictions Weight Bearing Restrictions: Yes RLE Weight Bearing: Weight bearing as tolerated   Pertinent Vitals/Pain 9/10 R knee especially with knee flexion; RN notified, and pt had been given pain meds prior    Mobility  Bed Mobility Bed Mobility: Not assessed (Pt up in chair) Supine to Sit: 4: Min assist;With rails Sitting - Scoot to Edge of Bed: 4: Min assist;With rail Details for Bed Mobility Assistance: Cues for technique; Assist for RLE, but pt did show good knee control Transfers Transfers: Sit to Stand;Stand to Sit Sit to Stand: 4: Min assist;With upper extremity assist;With armrests;From chair/3-in-1 Stand to Sit: 4: Min assist;To chair/3-in-1;Without upper extremity assist;With armrests Details for Transfer Assistance: VC's for safety, sequencing w/ RW and hand placement. Pt c/o groggy & pain, requires increased time for all tasks. Continue to  assess. Ambulation/Gait Ambulation/Gait Assistance: 4: Min assist Ambulation Distance (Feet): 3 Feet Assistive device: Rolling walker Ambulation/Gait Assistance Details: Pivot steps bed to chair with R knee blocked; R Knee buckle present, but minimal; Amb distance limited by nausea    Exercises Total Joint Exercises Quad Sets: AROM;Right;10 reps Short Arc QuadBarbaraann Boys;Right;10 reps Heel Slides: AAROM;Right;5 reps Straight Leg Raises: AAROM;Right;5 reps   PT Diagnosis:    PT Problem List:   PT Treatment Interventions:     PT Goals (current goals can now be found in the care plan section) Acute Rehab PT Goals Patient Stated Goal: walk PT Goal Formulation: With patient Time For Goal Achievement: 04/16/13 Potential to Achieve Goals: Good  Visit Information  Last PT Received On: 04/10/13 Assistance Needed: +1 History of Present Illness: s/p RTKA    Subjective Data  Subjective: Nauseated Patient Stated Goal: walk   Cognition  Cognition Arousal/Alertness: Awake/alert Behavior During Therapy: WFL for tasks assessed/performed;Flat affect Overall Cognitive Status: Within Functional Limits for tasks assessed    Balance     End of Session PT - End of Session Equipment Utilized During Treatment: Gait belt Activity Tolerance: Other (comment) (Limited by nausea) Patient left: in chair;with call bell/phone within reach;with nursing/sitter in room Nurse Communication: Other (comment) (requesting nausea meds)   GP     [name], [name] [phone]  04/10/2013, 12:43 PM

## 2013-04-10 NOTE — Progress Notes (Signed)
Physical Therapy Note  Informed pt's daughter, [name], that it may be necessary for Ms. Ostrosky to have further Rehab at [facility] prior to dc home; she voiced understanding;   Will continue to update DC plan in accordance with pt progress;  Thanks,  [place],  [phone]

## 2013-04-10 NOTE — Progress Notes (Signed)
Physical Therapy Treatment Patient Details Name: Maria Rowland MRN: [id number] DOB: 07-02-1940 Today's Date: 04/10/2013 Time: 4098-1191 PT Time Calculation (min): 20 min  PT Assessment / Plan / Recommendation  History of Present Illness s/p RTKA   PT Comments   Making slow progress with walking; still requires mod assist for RW advancement, and max encouragement to increase amb distance; Definitely recommend pt stay in hospital tonight  Re: dc planning, hopefully pt will progress well next session -- still, discussed pt with OT, and with the knowledge that her son cannot provide physical assist once pt is home, I agree that it is worth considering SNF Rehab to maximize independence and safety prior to dc'ing home, especially if she does not turn a corner and progress better within 24 hours; SW briefed on situation  I intended to discussed the possibility of SNF with pt, but she was dozing almost immediately upon sitting back down in recliner   Follow Up Recommendations  Home health PT;Supervision/Assistance - 24 hour (SNF may be necessary unless pt makes considerable progress next session)     Does the patient have the potential to tolerate intense rehabilitation     Barriers to Discharge        Equipment Recommendations  Rolling walker with 5" wheels    Recommendations for Other Services    Frequency 7X/week   Progress towards PT Goals Progress towards PT goals: Progressing toward goals  Plan Current plan remains appropriate    Precautions / Restrictions Precautions Precautions: Knee Restrictions Weight Bearing Restrictions: Yes RLE Weight Bearing: Weight bearing as tolerated   Pertinent Vitals/Pain Grimace with motion, but pt did not rate pain    Mobility  Bed Mobility Bed Mobility: Not assessed Transfers Transfers: Sit to Stand;Stand to Sit Sit to Stand: 4: Min assist;With upper extremity assist;With armrests;From chair/3-in-1 Stand to Sit: 4: Min assist;To  chair/3-in-1;Without upper extremity assist;With armrests Details for Transfer Assistance: VC's for safety, sequencing w/ RW and hand placement. Pt c/o groggy & pain, requires increased time for all tasks. Continue to assess. Ambulation/Gait Ambulation/Gait Assistance: 4: Min assist;3: Mod assist Ambulation Distance (Feet): 60 Feet Assistive device: Rolling walker Ambulation/Gait Assistance Details: Step-by-step cues for gait sequence; chair pushed behind for safety; required RW to be managed for her, but once the RW was advanced, she stepped pretty well; no R knee buckling noted; Required max encouragement to progressivley walk Gait Pattern: Step-to pattern    Exercises     PT Diagnosis:    PT Problem List:   PT Treatment Interventions:     PT Goals (current goals can now be found in the care plan section) Acute Rehab PT Goals Patient Stated Goal: Decrease pain, feeling sick  Visit Information  Last PT Received On: 04/10/13 Assistance Needed: +1 History of Present Illness: s/p RTKA    Subjective Data  Subjective: "I'm out in Left field" Patient Stated Goal: Decrease pain, feeling sick   Cognition  Cognition Arousal/Alertness: Lethargic;Suspect due to medications Behavior During Therapy: [facility] for tasks assessed/performed Overall Cognitive Status: Impaired/Different from baseline Area of Impairment: Attention Current Attention Level: Sustained General Comments: Noted some difficulty following commads related to gait sequencing; also at one point pt asked, "where are we?"; with cues she remembered we are at [facility] and that she had a knee replacement    Balance     End of Session PT - End of Session Equipment Utilized During Treatment: Gait belt Activity Tolerance: Patient limited by lethargy Patient left: in chair;with call bell/phone  within reach   GP     Van Clines [facility] [place], [place] [phone]  04/10/2013, 3:13 PM

## 2013-04-10 NOTE — Evaluation (Signed)
Occupational Therapy Evaluation Patient Details Name: Maria Rowland MRN: [id number] DOB: 09-20-1940 Today's Date: 04/10/2013 Time: 0272-5366 OT Time Calculation (min): 28 min  OT Assessment / Plan / Recommendation History of present illness s/p RTKA   Clinical Impression   Pt admitted w/ dx as above currently impacting her ability to perform ADL's and self care tasks (see OT problem list below). Pt w/ decreased ability to participate in acute OT initially secondary to c/o knee pain (RN notified/aware/pt w/ pain meds 1 hour prior) & also "feeling groggy & too loopy from all this pain medicine they keep giving me" to participate. Pt will need 24/assist @ d/c & if not, need to consider SNF Rehab.    OT Assessment  Patient needs continued OT Services    Follow Up Recommendations  SNF;Other (comment) (SNF vs 24 hr assist. Pt/daughter report she does not have 24/assist) Pt states adult son w/ MR lives w/ her & is not able to assist other than "to fetch something for me sometimes."    Barriers to Discharge      Equipment Recommendations  None recommended by OT    Recommendations for Other Services    Frequency  Min 2X/week    Precautions / Restrictions Precautions Precautions: Knee Restrictions Weight Bearing Restrictions: Yes RLE Weight Bearing: Weight bearing as tolerated   Pertinent Vitals/Pain 8/10 R Knee surgical pain, RN made aware. Pt then stated she was "Too groggy & loopy from all the pain medicine that they keep giving me." RN made aware of this. Pt repositioned in chair, ice applied, rest after activity.    ADL  Eating/Feeding: Performed;Independent Where Assessed - Eating/Feeding: Chair Grooming: Performed;Wash/dry hands;Wash/dry face;Set up Upper Body Bathing: Simulated;Set up Where Assessed - Upper Body Bathing: Supported sitting;Unsupported sitting Lower Body Bathing: Simulated;Moderate assistance Where Assessed - Lower Body Bathing: Supported sit to stand Upper  Body Dressing: Simulated;Set up Where Assessed - Upper Body Dressing: Unsupported sitting;Supported sitting Lower Body Dressing: Performed;Moderate assistance Where Assessed - Lower Body Dressing: Supported sit to stand Toilet Transfer: Simulated;Minimal assistance (Pt stood from chair, ambulated 4-5 steps & back. Declined into bathroom 2* pain) Toilet Transfer Method: Sit to stand Toilet Transfer Equipment: Bedside commode Toileting - Clothing Manipulation and Hygiene: Simulated;Moderate assistance Where Assessed - Toileting Clothing Manipulation and Hygiene: Standing;Sit to stand from 3-in-1 or toilet Tub/Shower Transfer Method: Not assessed Equipment Used: Gait belt;Rolling walker Transfers/Ambulation Related to ADLs: Pt very groggy & naseous despite medication. Pt also reporting 8/10 pain R knee, RN made aware, then pt states "They're giving me this pain medication & I'm out of it" When transferring, pt is Min A w/ significant amount of time for tasks, after completing, then states "I can't do this" secondary to pain. ADL Comments: Pt & pt's daughter werer educated in role of OT. Pt currently requires Min-mod assist for LB ADL's, Min A w/ increased time for transfers. Extensive discussion w/ pt's daughter Re: goals of OT, DME & recommendations as daughter is very concerned about pt pain level (see "Transfers/Ambulation" notes above) & ability to care for herself. Pt's daughter reports that her adult brother w/ MR lives w/ her mother and that he is not able to assist her other than "fetch something for her". Pt may need SNF vs 24/assist at d/c.    OT Diagnosis: Generalized weakness;Acute pain  OT Problem List: Decreased activity tolerance;Decreased knowledge of precautions;Decreased knowledge of use of DME or AE;Pain;Decreased strength OT Treatment Interventions: Self-care/ADL training;DME and/or AE instruction;Patient/family education;Therapeutic activities;Therapeutic exercise  OT  Goals(Current goals can be found in the care plan section) Acute Rehab OT Goals Patient Stated Goal: Decrease pain, feeling sick OT Goal Formulation: Patient unable to participate in goal setting Time For Goal Achievement: 04/24/13 Potential to Achieve Goals: Good  Visit Information  Last OT Received On: 04/10/13 Assistance Needed: +1 History of Present Illness: s/p RTKA       Prior Functioning     Home Living Family/patient expects to be discharged to:: Private residence Living Arrangements: Other (Comment) (Pt's adult son w/ MR is at home, not able to assist per pt/daughter) Available Help at Discharge: Family;Available PRN/intermittently Type of Home: House Home Access: Stairs to enter [employer] of Steps: 2 Entrance Stairs-Rails: None Home Layout: Multi-level;Able to live on main level with bedroom/bathroom Alternate Level Stairs-Number of Steps: flight Home Equipment: Walker - 2 wheels;Bedside commode;Tub bench Prior Function Level of Independence: Independent Communication Communication: No difficulties Dominant Hand: Right    Vision/Perception Vision - History Baseline Vision: Wears glasses all the time Patient Visual Report: No change from baseline   Cognition  Cognition Arousal/Alertness: Awake/alert Behavior During Therapy: WFL for tasks assessed/performed;Flat affect Overall Cognitive Status: Within Functional Limits for tasks assessed    Extremity/Trunk Assessment Upper Extremity Assessment Upper Extremity Assessment: Overall WFL for tasks assessed Lower Extremity Assessment Lower Extremity Assessment: Defer to PT evaluation    Mobility Bed Mobility Bed Mobility: Not assessed (Pt up in chair) Supine to Sit: 4: Min assist;With rails Sitting - Scoot to Edge of Bed: 4: Min assist;With rail Details for Bed Mobility Assistance: Cues for technique; Assist for RLE, but pt did show good knee control Transfers Transfers: Sit to Stand;Stand to  Sit Sit to Stand: 4: Min assist;With upper extremity assist;With armrests;From chair/3-in-1 Stand to Sit: 4: Min assist;To chair/3-in-1;Without upper extremity assist;With armrests Details for Transfer Assistance: VC's for safety, sequencing w/ RW and hand placement. Pt c/o groggy & pain, requires increased time for all tasks. Continue to assess.          End of Session OT - End of Session Equipment Utilized During Treatment: Gait belt;Rolling walker Activity Tolerance: Patient limited by pain Patient left: in chair;with call bell/phone within reach;with family/visitor present;with nursing/sitter in room Nurse Communication: Mobility status;Patient requests pain meds;Other (comment) (Pt reports of groggy & loopy from pain meds & asking for pain meds)  GO     Alm Bustard 04/10/2013, 12:45 PM

## 2013-04-10 NOTE — Op Note (Signed)
TOTAL KNEE REPLACEMENT OPERATIVE NOTE:  04/09/2013  12:34 PM  PATIENT:  Maria Rowland  72 y.o. female  PRE-OPERATIVE DIAGNOSIS:  osteoarthritis right knee  POST-OPERATIVE DIAGNOSIS:  osteoarthritis right knee  PROCEDURE:  Procedure(s): TOTAL KNEE ARTHROPLASTY  SURGEON:  Surgeon(s): Dannielle Huh, MD  PHYSICIAN ASSISTANT: Altamese Cabal, [facility]  ANESTHESIA:   general  DRAINS: Hemovac  SPECIMEN: None  COUNTS:  Correct  TOURNIQUET:   Total Tourniquet Time Documented: Thigh (Right) - 46 minutes Total: Thigh (Right) - 46 minutes   DICTATION:  Indication for procedure:    The patient is a 72 y.o. female who has failed conservative treatment for osteoarthritis right knee.  Informed consent was obtained prior to anesthesia. The risks versus benefits of the operation were explain and in a way the patient can, and did, understand.   On the implant demand matching protocol, this patient scored 10.  Therefore, this patient was not receive a polyethylene insert with vitamin E which is a high demand implant.  Description of procedure:     The patient was taken to the operating room and placed under anesthesia.  The patient was positioned in the usual fashion taking care that all body parts were adequately padded and/or protected.  I foley catheter was placed.  A tourniquet was applied and the leg prepped and draped in the usual sterile fashion.  The extremity was exsanguinated with the esmarch and tourniquet inflated to 350 mmHg.  Pre-operative range of motion was normal.  The knee was in 5 degree of mild varus.  A midline incision approximately 6-7 inches long was made with a #10 blade.  A new blade was used to make a parapatellar arthrotomy going 2-3 cm into the quadriceps tendon, over the patella, and alongside the medial aspect of the patellar tendon.  A synovectomy was then performed with the #10 blade and forceps. I then elevated the deep MCL off the medial tibial metaphysis  subperiosteally around to the semimembranosus attachment.    I everted the patella and used calipers to measure patellar thickness.  I used the reamer to ream down to appropriate thickness to recreate the native thickness.  I then removed excess bone with the rongeur and sagittal saw.  I used the appropriately sized template and drilled the three lug holes.  I then put the trial in place and measured the thickness with the calipers to ensure recreation of the native thickness.  The trial was then removed and the patella subluxed and the knee brought into flexion.  A homan retractor was place to retract and protect the patella and lateral structures.  A Z-retractor was place medially to protect the medial structures.  The extra-medullary alignment system was used to make cut the tibial articular surface perpendicular to the anamotic axis of the tibia and in 3 degrees of posterior slope.  The cut surface and alignment jig was removed.  I then used the intramedullary alignment guide to make a 4 valgus cut on the distal femur.  I then marked out the epicondylar axis on the distal femur.  The posterior condylar axis measured 3 degrees.  I then used the anterior referencing sizer and measured the femur to be a size 4.  The 4-In-1 cutting block was screwed into place in external rotation matching the posterior condylar angle, making our cuts perpendicular to the epicondylar axis.  Anterior, posterior and chamfer cuts were made with the sagittal saw.  The cutting block and cut pieces were removed.  A lamina spreader  was placed in 90 degrees of flexion.  The ACL, PCL, menisci, and posterior condylar osteophytes were removed.  A 12 mm spacer blocked was found to offer good flexion and extension gap balance after minimal in degree releasing.   The scoop retractor was then placed and the femoral finishing block was pinned in place.  The small sagittal saw was used as well as the lug drill to finish the femur.  The block  and cut surfaces were removed and the medullary canal hole filled with autograft bone from the cut pieces.  The tibia was delivered forward in deep flexion and external rotation.  A size C tray was selected and pinned into place centered on the medial 1/3 of the tibial tubercle.  The reamer and keel was used to prepare the tibia through the tray.    I then trialed with the size 4 femur, size C tibia, a 12 mm insert and the 32 patella.  I had excellent flexion/extension gap balance, excellent patella tracking.  Flexion was full and beyond 120 degrees; extension was zero.  These components were chosen and the staff opened them to me on the back table while the knee was lavaged copiously and the cement mixed.  The soft tissue was infiltrated with 60cc of exparel 1.3% through a 21 gauge needle.  I cemented in the components and removed all excess cement.  The polyethylene tibial component was snapped into place and the knee placed in extension while cement was hardening.  The capsule was infilltrated with 30cc of .25% Marcaine with epinephrine.  A hemovac was place in the joint exiting superolaterally.  A pain pump was place superomedially superficial to the arthrotomy.  Once the cement was hard, the tourniquet was let down.  Hemostasis was obtained.  The arthrotomy was closed with figure-8 #1 vicryl sutures.  The deep soft tissues were closed with #0 vicryls and the subcuticular layer closed with a running #2-0 vicryl.  The skin was reapproximated and closed with skin staples.  The wound was dressed with xeroform, 4 x4's, 2 ABD sponges, a single layer of webril and a TED stocking.   The patient was then awakened, extubated, and taken to the recovery room in stable condition.  BLOOD LOSS:  300cc DRAINS: 1 hemovac, 1 pain catheter COMPLICATIONS:  None.  PLAN OF CARE: Admit to inpatient   PATIENT DISPOSITION:  PACU - hemodynamically stable.   Delay start of Pharmacological VTE agent (>24hrs) due to  surgical blood loss or risk of bleeding:  not applicable  Please fax a copy of this op note to my office at [phone] (please only include page 1 and 2 of the Case Information op note)

## 2013-04-11 ENCOUNTER — Encounter (HOSPITAL_COMMUNITY): Payer: Self-pay | Admitting: Orthopedic Surgery

## 2013-04-11 LAB — CBC
HCT: 30.1 % — ABNORMAL LOW (ref 36.0–46.0)
Hemoglobin: 10.3 g/dL — ABNORMAL LOW (ref 12.0–15.0)
MCH: 31.4 pg (ref 26.0–34.0)
MCHC: 34.2 g/dL (ref 30.0–36.0)
MCV: 91.8 fL (ref 78.0–100.0)
Platelets: 220 10*3/uL (ref 150–400)
RBC: 3.28 MIL/uL — ABNORMAL LOW (ref 3.87–5.11)
RDW: 13.4 % (ref 11.5–15.5)
WBC: 8.8 10*3/uL (ref 4.0–10.5)

## 2013-04-11 LAB — BASIC METABOLIC PANEL
BUN: 5 mg/dL — ABNORMAL LOW (ref 6–23)
CO2: 25 mEq/L (ref 19–32)
Calcium: 8.6 mg/dL (ref 8.4–10.5)
Chloride: 103 mEq/L (ref 96–112)
Creatinine, Ser: 0.59 mg/dL (ref 0.50–1.10)
GFR calc Af Amer: >90 mL/min (ref >90)
GFR calc non Af Amer: >90 mL/min (ref >90)
Glucose, Bld: 97 mg/dL (ref 70–99)
Potassium: 4 mEq/L (ref 3.5–5.1)
Sodium: 135 mEq/L (ref 135–145)

## 2013-04-11 MED ORDER — ENOXAPARIN SODIUM 40 MG/0.4ML ~~LOC~~ SOLN: 40.0000 mg | SUBCUTANEOUS | Status: DC

## 2013-04-11 MED ORDER — METHOCARBAMOL 500 MG PO TABS: 500.0000 mg | ORAL_TABLET | Freq: Four times a day (QID) | ORAL | Status: DC | PRN

## 2013-04-11 MED ORDER — OXYCODONE HCL 10 MG PO TABS: 10.0000 mg | ORAL_TABLET | ORAL | Status: DC | PRN

## 2013-04-11 NOTE — Progress Notes (Signed)
Physical Therapy Treatment Patient Details Name: Maria Rowland MRN: [id number] DOB: 1941/05/07 Today's Date: 04/11/2013 Time: 8295-6213 PT Time Calculation (min): 34 min  PT Assessment / Plan / Recommendation  History of Present Illness s/p RTKA    PT Comments   Making gains in mobility and distance ambulating, however still quite slow to move, and requiring cues and assist; Updated dc plan to SNF, pt and daughter in agreement  Follow Up Recommendations  Supervision/Assistance - 24 hour;SNF      Does the patient have the potential to tolerate intense rehabilitation     Barriers to Discharge        Equipment Recommendations  Rolling walker with 5" wheels    Recommendations for Other Services    Frequency 7X/week   Progress towards PT Goals Progress towards PT goals: Progressing toward goals (though slowly)  Plan Discharge plan needs to be updated    Precautions / Restrictions Precautions Precautions: Knee Restrictions Weight Bearing Restrictions: Yes RLE Weight Bearing: Weight bearing as tolerated   Pertinent Vitals/Pain Did not rate, but definite grimace with knee flexion patient repositioned for comfort and optimal knee extension     Mobility  Bed Mobility Bed Mobility: Not assessed Supine to Sit: 4: Min assist;With rails;HOB elevated Sitting - Scoot to Edge of Bed: 4: Min assist;With rail Details for Bed Mobility Assistance: Cues for technique; Assist for RLE, but pt did show good knee control Transfers Transfers: Sit to Stand;Stand to Sit Sit to Stand: 4: Min assist;With upper extremity assist;With armrests;From chair/3-in-1 Stand to Sit: 4: Min assist;To chair/3-in-1;Without upper extremity assist;With armrests Details for Transfer Assistance: VC's for safety, sequencing w/ RW and hand placement. Pt c/o groggy & pain, requires increased time for all tasks. Continue to assess. Ambulation/Gait Ambulation/Gait Assistance: 4: Min assist Ambulation Distance (Feet):  80 Feet Assistive device: Rolling walker Ambulation/Gait Assistance Details: Cues for gait sequence and to activate quad for stance stability Gait Pattern: Step-to pattern;Decreased stance time - right Gait velocity: decr    Exercises Total Joint Exercises Ankle Circles/Pumps: AROM;Both;10 reps Quad Sets: AROM;Right;10 reps Heel Slides: AAROM;Right;10 reps Straight Leg Raises: AAROM;Right;10 reps   PT Diagnosis:    PT Problem List:   PT Treatment Interventions:     PT Goals (current goals can now be found in the care plan section) Acute Rehab PT Goals Patient Stated Goal: Walk  Visit Information  Last PT Received On: 04/11/13 Assistance Needed: +1 History of Present Illness: s/p RTKA     Subjective Data  Subjective: agreeable to snf Patient Stated Goal: Walk   Cognition  Cognition Arousal/Alertness: Lethargic;Suspect due to medications Behavior During Therapy: [facility] for tasks assessed/performed Overall Cognitive Status: Impaired/Different from baseline Area of Impairment: Attention Current Attention Level: Sustained Following Commands: Follows one step commands inconsistently Problem Solving: Slow processing;Decreased initiation;Difficulty sequencing;Requires verbal cues;Requires tactile cues General Comments: Noted some difficulty following commads related to gait sequencing    Balance     End of Session PT - End of Session Equipment Utilized During Treatment: Gait belt Activity Tolerance: Patient limited by lethargy;Patient limited by pain Patient left: in chair;with call bell/phone within reach;with family/visitor present CPM Right Knee CPM Right Knee: On Right Knee Flexion (Degrees): 60 Right Knee Extension (Degrees): 0   GP     Van Clines Benjamin Perez, [place] [phone]  04/11/2013, 1:55 PM

## 2013-04-11 NOTE — Clinical Social Work Placement (Addendum)
Clinical Social Work Department  CLINICAL SOCIAL WORK PLACEMENT NOTE  Patient: Maria Rowland Account Number: [id number]  Admit date: 04/09/13  Clinical Social Worker: Sabino Niemann LCSWA Date/time: 04/11/2013 11:30 AM  Clinical Social Work is seeking post-discharge placement for this patient at the following level of care: SKILLED NURSING (*CSW will update this form in Epic as items are completed)  04/11/2013 Patient/family provided with [facility] Department of Clinical Social Work's list of facilities offering this level of care within the geographic area requested by the patient (or if unable, by the patient's family).  04/11/2013 Patient/family informed of their freedom to choose among providers that offer the needed level of care, that participate in Medicare, Medicaid or managed care program needed by the patient, have an available bed and are willing to accept the patient.  04/11/2013 Patient/family informed of [facility]' ownership interest in [facility], as well as of the fact that they are under no obligation to receive care at this facility.  PASARR submitted to EDS on 04/12/2013 PASARR number received from EDS on 04/12/2013  FL2 transmitted to all facilities in geographic area requested by pt/family on 04/11/2013  FL2 transmitted to all facilities within larger geographic area on  Patient informed that his/her managed care company has contracts with or will negotiate with certain facilities, including the following:  Patient/family informed of bed offers received: 04/12/2013 Patient chooses bed at [facility] Physician recommends and patient chooses bed at  Patient to be transferred to on 04/12/2013 Patient to be transferred to facility by Private Vehicle The following physician request were entered in Epic:  Additional Comments:

## 2013-04-11 NOTE — Progress Notes (Signed)
Physical Therapy Treatment Note  Gait is stable, without loss of balance; continues to be slow; Pt seems clearer mentally, did not indicate she felt "out of it", but still quite slow to answer questions and make transitions  Will continue to assess for safest dc plan with respect to pt progress  Pain with R knee flexion in particular; did not rate patient repositioned for comfort and optimal knee ext   04/11/13 1600  PT Visit Information  Last PT Received On 04/11/13  Assistance Needed +1  History of Present Illness s/p RTKA   PT Time Calculation  PT Start Time 1558  PT Stop Time 1626  PT Time Calculation (min) 28 min  Subjective Data  Subjective agreeable to amb; needing to get to the restroom  Patient Stated Goal Walk  Precautions  Precautions Knee  Restrictions  RLE Weight Bearing WBAT  Cognition  Arousal/Alertness Suspect due to medications (Slow to answer questions)  Behavior During Therapy [facility] for tasks assessed/performed  Overall Cognitive Status Within Functional Limits for tasks assessed (for simple mobility tasks)  General Comments Noted some difficulty following commads related to gait sequencing  Bed Mobility  Bed Mobility Supine to Sit  Supine to Sit 4: Min assist;With rails;HOB elevated  Sitting - Scoot to Edge of Bed 4: Min assist;With rail  Details for Bed Mobility Assistance Cues for technique; Assist for RLE, but pt did show good knee control  Transfers  Transfers Sit to Stand;Stand to Sit  Sit to Stand 4: Min assist;With upper extremity assist;With armrests;From chair/3-in-1  Stand to Sit 4: Min assist;To chair/3-in-1;Without upper extremity assist;With armrests  Details for Transfer Assistance VC's for safety, sequencing w/ RW and hand placement. Pt c/o groggy & pain, requires increased time for all tasks. Continue to assess.  Ambulation/Gait  Ambulation/Gait Assistance 4: Min assist  Ambulation Distance (Feet) 80 Feet  Assistive device Rolling walker   Ambulation/Gait Assistance Details Cues to incr R knee flexion in swing, and to incr step length  Gait Pattern Step-to pattern;Decreased stance time - right;Step-through pattern  Gait velocity decr  General Gait Details emerging step-through pattern  PT - End of Session  Activity Tolerance Patient limited by lethargy;Patient limited by pain;Patient tolerated treatment well  Patient left in chair;with call bell/phone within reach;with family/visitor present  PT - Assessment/Plan  PT Plan Current plan remains appropriate  PT Frequency 7X/week  Follow Up Recommendations Supervision/Assistance - 24 hour;SNF  PT equipment Rolling walker with 5" wheels  PT Goal Progression  Progress towards PT goals Progressing toward goals  PT General Charges  $$ ACUTE PT VISIT 1 Procedure  PT Treatments  $Gait Training 8-22 mins  $Therapeutic Activity 8-22 mins   Maria Rowland,  [phone]

## 2013-04-11 NOTE — Progress Notes (Signed)
SPORTS MEDICINE AND JOINT REPLACEMENT  Georgena Spurling, MD   Altamese Cabal, PA-C [address] [place], [place], [place]  [address]                             [phone]   PROGRESS NOTE  Subjective:  negative for Chest Pain  negative for Shortness of Breath  negative for Nausea/Vomiting   negative for Calf Pain  negative for Bowel Movement   Tolerating Diet: yes         Patient reports pain as 6 on 0-10 scale.    Objective: Vital signs in last 24 hours:   Patient Vitals for the past 24 hrs:  BP Temp Temp src Pulse Resp SpO2  04/11/13 1343 - 99.9 F (37.7 C) Oral - - -  04/11/13 0554 166/62 mmHg 99.5 F (37.5 C) - 84 16 100 %  04/11/13 0400 - - - - 16 -  04/11/13 0000 - - - - 16 -  04/10/13 2106 157/65 mmHg 98.2 F (36.8 C) - 75 16 98 %  04/10/13 2000 - - - - 16 -  04/10/13 1600 154/84 mmHg 98 F (36.7 C) - 79 16 98 %    @flow {1959:LAST@   Intake/Output from previous day:   11/18 0701 - 11/19 0700 In: 480 [P.O.:480] Out: 450 [Urine:200; Drains:250]   Intake/Output this shift:   11/19 0701 - 11/19 1900 In: 80 [I.V.:80] Out: -    Intake/Output     11/18 0701 - 11/19 0700 11/19 0701 - 11/20 0700   P.O. 480    I.V. (mL/kg)  80 (1.1)   Total Intake(mL/kg) 480 (6.7) 80 (1.1)   Urine (mL/kg/hr) 200 (0.1)    Drains 250 (0.1)    Total Output 450     Net +30 +80        Urine Occurrence 3 x 3 x      LABORATORY DATA:  Recent Labs  04/09/13 1515 04/10/13 0720 04/11/13 0524  WBC 8.5 6.5 8.8  HGB 10.9* 10.0* 10.3*  HCT 30.3* 28.2* 30.1*  PLT 236 203 220    Recent Labs  04/09/13 1515 04/10/13 0720 04/11/13 0524  NA  --  136 135  K  --  3.7 4.0  CL  --  103 103  CO2  --  26 25  BUN  --  7 5*  CREATININE 0.63 0.62 0.59  GLUCOSE  --  106* 97  CALCIUM  --  8.3* 8.6   Lab Results  Component Value Date   INR 0.97 04/03/2013    Examination:  General appearance: alert, cooperative and no distress Extremities: Homans sign is negative, no sign of  DVT  Wound Exam: clean, dry, intact   Drainage:  None: wound tissue dry  Motor Exam: EHL and FHL Intact  Sensory Exam: Deep Peroneal normal   Assessment:    2 Days Post-Op  Procedure(s) (LRB): TOTAL KNEE ARTHROPLASTY (Right)  ADDITIONAL DIAGNOSIS:  Active Problems:   * No active hospital problems. *  Acute Blood Loss Anemia   Plan: Physical Therapy as ordered Weight Bearing as Tolerated (WBAT)  DVT Prophylaxis:  Lovenox  DISCHARGE PLAN: Home vs SNF  DISCHARGE NEEDS: HHPT, CPM, Walker and 3-in-1 comode seat         Maria Rowland 04/11/2013, 2:03 PM

## 2013-04-11 NOTE — Progress Notes (Signed)
Occupational Therapy Treatment Patient Details Name: Maria Rowland MRN: [id number] DOB: 01/11/1941 Today's Date: 04/11/2013 Time: 4098-1191 OT Time Calculation (min): 29 min  OT Assessment / Plan / Recommendation  History of present illness s/p RTKA    OT comments  Pt continues to demonstrate significant deficits in ability to perform functional transfers and ADL's & will need increased assist than is available to her at home. Updated d/c plan to SNF, discussed w/ pt/daughter. Daughter is in agreement w/ this (pt eyes closed, did not respond). Cont w/ plan of care/goals as stated at eval.  Follow Up Recommendations  SNF    Barriers to Discharge       Equipment Recommendations  Other (comment) (Defer to next venue)    Recommendations for Other Services    Frequency Min 2X/week   Progress towards OT Goals Progress towards OT goals: Progressing toward goals  Plan Discharge plan needs to be updated    Precautions / Restrictions Precautions Precautions: Knee Restrictions Weight Bearing Restrictions: Yes RLE Weight Bearing: Weight bearing as tolerated   Pertinent Vitals/Pain See pain in chart 11:36am. R knee pain    ADL  Eating/Feeding: Performed;Independent Where Assessed - Eating/Feeding: Chair Grooming: Performed;Wash/dry hands;Min guard Where Assessed - Grooming: Supported standing (Pt noted to lean on elbows at sink, encouraged upright standing) Lower Body Dressing: Performed;Moderate assistance Where Assessed - Lower Body Dressing: Supported sit to Pharmacist, hospital: Performed;Minimal assistance (Ambulated into bathroom, 3:1 over toilet.) Toilet Transfer Method: Sit to stand Toilet Transfer Equipment: Raised toilet seat with arms (or 3-in-1 over toilet) Toileting - Clothing Manipulation and Hygiene: Performed;Minimal assistance Where Assessed - Engineer, mining and Hygiene: Standing Tub/Shower Transfer Method: Not assessed Equipment Used: Gait  belt;Rolling walker Transfers/Ambulation Related to ADLs: Pt contiunes to be limited in functional mobility. Requires increased verbal and tactile cues (maximal) for RW safety, sequencing during functional mobillty. Tends to keep R knee extended during functional mobilty despite maximal cues for normal gait w/ knee flexion during step through. ADL Comments: Pt participated in functional mobility for ADL transfers today. Pt continues to be limited in her ability to perform functional tasks related to ADL's and self care s/p R TKA, ie. difficulty motor planning w/ RW use; standing after using commode and "forgetting" to perform peri care "What do you want me to do now?", standing at sink to wash hands & stating "What am I supposed to do here?" after using toilet. Pt often needs maximal verbal & tactile cues for ADL tasks. Discussed SNF rehab w/ pt/daughter secondary to not having 24/assist at home. Pt's daughter is in agreement, pt closed eyes, did not respond.    OT Diagnosis:    OT Problem List:   OT Treatment Interventions:     OT Goals(current goals can now be found in the care plan section) Acute Rehab OT Goals Patient Stated Goal: Decrease pain R LE OT Goal Formulation: Patient unable to participate in goal setting Time For Goal Achievement: 04/24/13 Potential to Achieve Goals: Good  Visit Information  Last OT Received On: 04/11/13 Assistance Needed: +1 History of Present Illness: s/p RTKA     Subjective Data      Prior Functioning       Cognition  Cognition Arousal/Alertness: Lethargic;Suspect due to medications Behavior During Therapy: [facility] for tasks assessed/performed Overall Cognitive Status: Impaired/Different from baseline Area of Impairment: Attention;Following commands;Safety/judgement;Problem solving Current Attention Level: Sustained Following Commands: Follows one step commands inconsistently Problem Solving: Slow processing;Decreased initiation;Difficulty  sequencing;Requires verbal cues;Requires tactile  cues General Comments: Noted some difficulty following commads related to gait sequencing as well as initiation and follow through w/ ADL tasks.    Mobility  Bed Mobility Bed Mobility: Supine to Sit;Sitting - Scoot to Edge of Bed Supine to Sit: 4: Min assist;With rails;HOB elevated Sitting - Scoot to [place] of Bed: 4: Min assist;With rail Details for Bed Mobility Assistance: Cues for technique; Assist for RLE, but pt did show good knee control Transfers Transfers: Sit to Stand;Stand to Sit Sit to Stand: 4: Min assist;With upper extremity assist;With armrests;From chair/3-in-1;From bed Stand to Sit: 4: Min assist;To chair/3-in-1;With armrests;Without upper extremity assist Details for Transfer Assistance: VC's for safety, sequencing w/ RW and hand placement. Pt c/o groggy & pain, requires increased time for all tasks, difficulty following one step commands.             End of Session OT - End of Session Equipment Utilized During Treatment: Gait belt;Rolling walker Activity Tolerance: Patient limited by lethargy;Patient limited by pain Patient left: in chair;with call bell/phone within reach;with family/visitor present Nurse Communication: Other (comment) (Update d/c plan, recommend SNF)  GO     Alm Bustard 04/11/2013, 12:20 PM

## 2013-04-11 NOTE — Clinical Social Work Psychosocial (Signed)
Clinical Social Work Department  BRIEF PSYCHOSOCIAL ASSESSMENT  Patient: Maria Rowland  Account [id number]  Admit date: 04/09/13 Clinical Social Worker Sabino Niemann, MSW Date/Time: 04/11/2013 Referred by: Physician Date Referred: 04/11/2013 Referred for   SNF Placement   Other Referral:  Interview type: Patient  Other interview type: PSYCHOSOCIAL DATA  Living Status:Family Admitted from facility:  Level of care:  Primary support name: Pennell,Janet  Primary support relationship to patient: Daughter Degree of support available:  Strong and vested  CURRENT CONCERNS  Current Concerns   Post-Acute Placement   Other Concerns:  SOCIAL WORK ASSESSMENT / PLAN  CSW met with pt re: PT recommendation for SNF.   Pt lives with family  CSW explained placement process and answered questions.   Pt reports [facility]  as her preference    CSW completed FL2 and initiated SNF search.     Assessment/plan status: Information/Referral to [facility]  Other assessment/ plan:  Information/referral to community resources:  SNF   PTAR  PATIENT'S/FAMILY'S RESPONSE TO PLAN OF CARE:  Pt  reports she is agreeable to ST SNF in order to increase strength and independence with mobility prior to returning home  Pt verbalized understanding of placement process and appreciation for CSW assist.   Sabino Niemann, MSW, LCSWA [phone]

## 2013-04-12 DIAGNOSIS — I10 Essential (primary) hypertension: Secondary | ICD-10-CM | POA: Diagnosis not present

## 2013-04-12 DIAGNOSIS — R269 Unspecified abnormalities of gait and mobility: Secondary | ICD-10-CM | POA: Diagnosis not present

## 2013-04-12 DIAGNOSIS — M6281 Muscle weakness (generalized): Secondary | ICD-10-CM | POA: Diagnosis not present

## 2013-04-12 DIAGNOSIS — M1990 Unspecified osteoarthritis, unspecified site: Secondary | ICD-10-CM | POA: Diagnosis not present

## 2013-04-12 DIAGNOSIS — Z96659 Presence of unspecified artificial knee joint: Secondary | ICD-10-CM | POA: Diagnosis not present

## 2013-04-12 DIAGNOSIS — Z5189 Encounter for other specified aftercare: Secondary | ICD-10-CM | POA: Diagnosis not present

## 2013-04-12 DIAGNOSIS — I4891 Unspecified atrial fibrillation: Secondary | ICD-10-CM | POA: Diagnosis not present

## 2013-04-12 DIAGNOSIS — Z471 Aftercare following joint replacement surgery: Secondary | ICD-10-CM | POA: Diagnosis not present

## 2013-04-12 LAB — BASIC METABOLIC PANEL
BUN: 6 mg/dL (ref 6–23)
CO2: 27 mEq/L (ref 19–32)
Calcium: 8.3 mg/dL — ABNORMAL LOW (ref 8.4–10.5)
Chloride: 102 mEq/L (ref 96–112)
Creatinine, Ser: 0.52 mg/dL (ref 0.50–1.10)
GFR calc Af Amer: >90 mL/min (ref >90)
GFR calc non Af Amer: >90 mL/min (ref >90)
Glucose, Bld: 105 mg/dL — ABNORMAL HIGH (ref 70–99)
Potassium: 4 mEq/L (ref 3.5–5.1)
Sodium: 135 mEq/L (ref 135–145)

## 2013-04-12 LAB — CBC
HCT: 28.2 % — ABNORMAL LOW (ref 36.0–46.0)
Hemoglobin: 9.7 g/dL — ABNORMAL LOW (ref 12.0–15.0)
MCH: 31.5 pg (ref 26.0–34.0)
MCHC: 34.4 g/dL (ref 30.0–36.0)
MCV: 91.6 fL (ref 78.0–100.0)
Platelets: 218 10*3/uL (ref 150–400)
RBC: 3.08 MIL/uL — ABNORMAL LOW (ref 3.87–5.11)
RDW: 13.4 % (ref 11.5–15.5)
WBC: 7.5 10*3/uL (ref 4.0–10.5)

## 2013-04-12 NOTE — Progress Notes (Signed)
Physical Therapy Treatment Patient Details Name: Maria Rowland MRN: [id number] DOB: [birth year] Today's Date: 04/12/2013 Time: 3086-5784 PT Time Calculation (min): 26 min  PT Assessment / Plan / Recommendation  History of Present Illness s/p RTKA    PT Comments   Pt. With improving mobility but still with difficulty with her processing and with decreased safety.  Pt. Clearly needs 24 hour care and min of 5x per week therapies.  Follow Up Recommendations  Supervision/Assistance - 24 hour;SNF     Does the patient have the potential to tolerate intense rehabilitation     Barriers to Discharge        Equipment Recommendations  Rolling walker with 5" wheels    Recommendations for Other Services    Frequency 7X/week   Progress towards PT Goals Progress towards PT goals: Progressing toward goals  Plan Current plan remains appropriate    Precautions / Restrictions Precautions Precautions: Knee Restrictions Weight Bearing Restrictions: Yes RLE Weight Bearing: Weight bearing as tolerated     See vitals tab    Mobility  Bed Mobility Bed Mobility: Supine to Sit Supine to Sit: 4: Min guard;With rails;HOB elevated Sitting - Scoot to Edge of Bed: 4: Min guard;With rail Details for Bed Mobility Assistance: able to manage own R LE today , used L foot as hook to help support R LE Transfers Transfers: Sit to Stand;Stand to Sit Sit to Stand: 4: Min assist;From bed;With upper extremity assist;From chair/3-in-1;With armrests Stand to Sit: 4: Min assist;With upper extremity assist;With armrests;To chair/3-in-1 Details for Transfer Assistance: vc's for safe technique Ambulation/Gait Ambulation/Gait Assistance: 4: Min assist Ambulation Distance (Feet): 90 Feet Assistive device: Rolling walker Ambulation/Gait Assistance Details: cues for increasing right knee flexion during swing phase Gait Pattern: Step-to pattern;Decreased stance time - right Gait velocity: decr Stairs: Yes Stairs  Assistance: 4: Min assist Stairs Assistance Details (indicate cue type and reason): cues for technqiue and swquencing, min assist for safety and stability Stair Management Technique: No rails;Backwards;With walker Number of Stairs: 1    Exercises     PT Diagnosis:    PT Problem List:   PT Treatment Interventions:     PT Goals (current goals can now be found in the care plan section)    Visit Information  Last PT Received On: 04/12/13 Assistance Needed: +1 History of Present Illness: s/p RTKA     Subjective Data  Subjective: Pt. asking to go to the restroom   Cognition  Cognition Arousal/Alertness: Awake/alert (but slow to process information and requests) Behavior During Therapy: [facility] for tasks assessed/performed Overall Cognitive Status: Within Functional Limits for tasks assessed (for simple tasks) Memory: Decreased short-term memory (difficulty remembering how to into her home (# of steps)) Following Commands: Follows one step commands consistently Problem Solving: Slow processing;Requires verbal cues General Comments: Pt. unable to recqll how to get into her home ( how steps were laid out,etc)  Even with increased time, she could not recall    Balance     End of Session PT - End of Session Equipment Utilized During Treatment: Gait belt Activity Tolerance: Patient tolerated treatment well Patient left: in chair;with call bell/phone within reach Nurse Communication: Mobility status;Other (comment) (difficulty with processing information) CPM Right Knee CPM Right Knee: On Right Knee Flexion (Degrees): 55 Right Knee Extension (Degrees): 0   GP     Ferman Hamming 04/12/2013, 1:33 PM Weldon Picking PT Acute Rehab Services [phone] Bee[place] [phone]

## 2013-04-12 NOTE — Progress Notes (Signed)
Clinical social worker assisted with patient discharge to skilled nursing facility,[facility].  CSW addressed all family questions and concerns. CSW copied chart and added all important documents.. Clinical Social Worker will sign off for now as social work intervention is no longer needed.   Sabino Niemann, MSW, [employer] [phone]

## 2013-04-12 NOTE — Discharge Summary (Signed)
SPORTS MEDICINE & JOINT REPLACEMENT   Georgena Spurling, MD   Altamese Cabal, PA-C [address] [place], [place], [place]  [address]                             [phone]  PATIENT ID: Maria Rowland        MRN:  [id number]          DOB/AGE: 07-02-1940 / 72 y.o.    DISCHARGE SUMMARY  ADMISSION DATE:    04/09/2013 DISCHARGE DATE:   04/12/2013   ADMISSION DIAGNOSIS: osteoarthritis right knee    DISCHARGE DIAGNOSIS:  osteoarthritis right knee    ADDITIONAL DIAGNOSIS: Active Problems:   * No active hospital problems. *  Past Medical History  Diagnosis Date  . HTN (hypertension)   . Irregular heart beat   . Osteoarthritis   . Shortness of breath     when she has palpitations  . Constipation     PROCEDURE: Procedure(s): TOTAL KNEE ARTHROPLASTY on 04/09/2013  CONSULTS:     HISTORY:  See H&P in chart  HOSPITAL COURSE:  Dori Devino is a 72 y.o. admitted on 04/09/2013 and found to have a diagnosis of osteoarthritis right knee.  After appropriate laboratory studies were obtained  they were taken to the operating room on 04/09/2013 and underwent Procedure(s): TOTAL KNEE ARTHROPLASTY.   They were given perioperative antibiotics:  Anti-infectives   Start     Dose/Rate Route Frequency Ordered Stop   04/09/13 1415  ceFAZolin (ANCEF) IVPB 1 g/50 mL premix     1 g 100 mL/hr over 30 Minutes Intravenous Every 6 hours 04/09/13 1405 04/09/13 2007   04/09/13 0600  ceFAZolin (ANCEF) IVPB 2 g/50 mL premix     2 g 100 mL/hr over 30 Minutes Intravenous On call to O.R. 04/08/13 1319 04/09/13 0854     POD# 1: Vital signs were stable.  Patient denied Chest pain, shortness of breath, or calf pain. Tolerated the procedure well.  Placed with a foley intraoperatively.   Patient was started on Lovenox 30 mg subcutaneously twice daily at 8am.  Consults to PT, OT, and care management were made.  The patient was weight bearing as tolerated.  CPM was placed on the operative leg 0-90 degrees for 6-8 hours  a day.  Incentive spirometry was taught.  Dressing was changed.  Hemovac were discontinued.      POD #2, Continued  PT for ambulation and exercise program.  IV saline locked.  O2 discontinued.  POD #3  Continued to struggle with verbal cues   The remainder of the hospital course was dedicated to ambulation and strengthening.   The patient was discharged on 3 Days Post-Op in  Good condition.  Blood products given:none  DIAGNOSTIC STUDIES: Recent vital signs: Patient Vitals for the past 24 hrs:  BP Temp Temp src Pulse Resp SpO2  04/12/13 1210 - - - - 18 98 %  04/12/13 0935 125/59 mmHg - - 89 - -  04/12/13 0830 - - - - 16 98 %  04/12/13 0524 133/57 mmHg 99.1 F (37.3 C) Oral 85 16 94 %  04/11/13 2210 134/54 mmHg 99 F (37.2 C) Oral 84 16 95 %  04/11/13 1818 136/54 mmHg 98.5 F (36.9 C) Oral 100 - 98 %  04/11/13 1400 147/60 mmHg 99.4 F (37.4 C) - 82 15 95 %  04/11/13 1343 - 99.9 F (37.7 C) Oral - - -  Recent laboratory studies:  Recent Labs  04/09/13 1515 04/10/13 0720 04/11/13 0524 04/12/13 0434  WBC 8.5 6.5 8.8 7.5  HGB 10.9* 10.0* 10.3* 9.7*  HCT 30.3* 28.2* 30.1* 28.2*  PLT 236 203 220 218    Recent Labs  04/09/13 1515 04/10/13 0720 04/11/13 0524 04/12/13 0434  NA  --  136 135 135  K  --  3.7 4.0 4.0  CL  --  103 103 102  CO2  --  26 25 27   BUN  --  7 5* 6  CREATININE 0.63 0.62 0.59 0.52  GLUCOSE  --  106* 97 105*  CALCIUM  --  8.3* 8.6 8.3*   Lab Results  Component Value Date   INR 0.97 04/03/2013     Recent Radiographic Studies :  Dg Chest 2 View  04/03/2013   CLINICAL DATA:  Preop knee replacement surgery  EXAM: CHEST - 2 VIEW  COMPARISON:  None available  FINDINGS: The heart size and mediastinal contours are within normal limits. Both lungs are clear. The visualized skeletal structures are unremarkable. No effusion. Surgical clips in the right mid abdomen.  IMPRESSION: No acute cardiopulmonary disease.   Electronically Signed   By: Oley Balm M.D.   On: 04/03/2013 16:36    DISCHARGE INSTRUCTIONS: Discharge Orders   Future Orders Complete By Expires   Call MD / Call 911  As directed    Comments:     If you experience chest pain or shortness of breath, CALL 911 and be transported to the hospital emergency room.  If you develope a fever above 101 F, pus (white drainage) or increased drainage or redness at the wound, or calf pain, call your surgeon's office.   Change dressing  As directed    Comments:     Change dressing on friday, then change the dressing daily with sterile 4 x 4 inch gauze dressing and apply TED hose.   Constipation Prevention  As directed    Comments:     Drink plenty of fluids.  Prune juice may be helpful.  You may use a stool softener, such as Colace (over the counter) 100 mg twice a day.  Use MiraLax (over the counter) for constipation as needed.   CPM  As directed    Comments:     Continuous passive motion machine (CPM):      Use the CPM from 0 to 90 for 6-8 hours per day.      You may increase by 10 per day.  You may break it up into 2 or 3 sessions per day.      Use CPM for 2 weeks or until you are told to stop.   Diet - low sodium heart healthy  As directed    Do not put a pillow under the knee. Place it under the heel.  As directed    Driving restrictions  As directed    Comments:     No driving for 6 weeks   Increase activity slowly as tolerated  As directed    Lifting restrictions  As directed    Comments:     No lifting for 6 weeks   TED hose  As directed    Comments:     Use stockings (TED hose) for 3 weeks on both leg(s).  You may remove them at night for sleeping.      DISCHARGE MEDICATIONS:     Medication List         acetaminophen  650 MG CR tablet  Commonly known as:  TYLENOL  Take 650 mg by mouth 2 (two) times daily.     CALCIUM & VIT D3 BONE HEALTH PO  Take 1 tablet by mouth 2 (two) times daily.     CENTRUM SILVER ADULT 50+ PO  Take by mouth daily.     enoxaparin  40 MG/0.4ML injection  Commonly known as:  LOVENOX  Inject 0.4 mLs (40 mg total) into the skin daily.     METAMUCIL PO  Take 1 application by mouth 2 (two) times daily.     methocarbamol 500 MG tablet  Commonly known as:  ROBAXIN  Take 1-2 tablets (500-1,000 mg total) by mouth every 6 (six) hours as needed for muscle spasms.     metoprolol succinate 50 MG 24 hr tablet  Commonly known as:  TOPROL-XL  Take 50 mg by mouth daily.     Oxycodone HCl 10 MG Tabs  Take 1 tablet (10 mg total) by mouth every 4 (four) hours as needed.        FOLLOW UP VISIT:       Follow-up Information   Follow up with Raymon Mutton, MD. Call on 04/24/2013.   Specialty:  Orthopedic Surgery   Contact information:   [address] [place] [address] [phone]       DISPOSITION: SNF  CONDITION:  Good   Taggart Prasad 04/12/2013, 12:54 PM

## 2013-04-12 NOTE — Progress Notes (Signed)
Report given to RN at [facility] at this time

## 2013-04-12 NOTE — Progress Notes (Signed)
SPORTS MEDICINE AND JOINT REPLACEMENT  Georgena Spurling, MD   Altamese Cabal, PA-C [address] [place], [place], [place]  [address]                             [phone]   PROGRESS NOTE  Subjective:  negative for Chest Pain  negative for Shortness of Breath  negative for Nausea/Vomiting   negative for Calf Pain  negative for Bowel Movement   Tolerating Diet: yes         Patient reports pain as 5 on 0-10 scale.    Objective: Vital signs in last 24 hours:   Patient Vitals for the past 24 hrs:  BP Temp Temp src Pulse Resp SpO2  04/12/13 1210 - - - - 18 98 %  04/12/13 0935 125/59 mmHg - - 89 - -  04/12/13 0830 - - - - 16 98 %  04/12/13 0524 133/57 mmHg 99.1 F (37.3 C) Oral 85 16 94 %  04/11/13 2210 134/54 mmHg 99 F (37.2 C) Oral 84 16 95 %  04/11/13 1818 136/54 mmHg 98.5 F (36.9 C) Oral 100 - 98 %  04/11/13 1400 147/60 mmHg 99.4 F (37.4 C) - 82 15 95 %  04/11/13 1343 - 99.9 F (37.7 C) Oral - - -    @flow {1959:LAST@   Intake/Output from previous day:   11/19 0701 - 11/20 0700 In: 320 [P.O.:240; I.V.:80] Out: 850 [Urine:850]   Intake/Output this shift:   11/20 0701 - 11/20 1900 In: 240 [P.O.:240] Out: 100 [Urine:100]   Intake/Output     11/19 0701 - 11/20 0700 11/20 0701 - 11/21 0700   P.O. 240 240   I.V. (mL/kg) 80 (1.1)    Total Intake(mL/kg) 320 (4.5) 240 (3.4)   Urine (mL/kg/hr) 850 (0.5) 100 (0.2)   Drains     Total Output 850 100   Net -530 +140        Urine Occurrence 7 x 1 x      LABORATORY DATA:  Recent Labs  04/09/13 1515 04/10/13 0720 04/11/13 0524 04/12/13 0434  WBC 8.5 6.5 8.8 7.5  HGB 10.9* 10.0* 10.3* 9.7*  HCT 30.3* 28.2* 30.1* 28.2*  PLT 236 203 220 218    Recent Labs  04/09/13 1515 04/10/13 0720 04/11/13 0524 04/12/13 0434  NA  --  136 135 135  K  --  3.7 4.0 4.0  CL  --  103 103 102  CO2  --  26 25 27   BUN  --  7 5* 6  CREATININE 0.63 0.62 0.59 0.52  GLUCOSE  --  106* 97 105*  CALCIUM  --  8.3* 8.6 8.3*   Lab  Results  Component Value Date   INR 0.97 04/03/2013    Examination:  General appearance: alert, cooperative and no distress Extremities: extremities normal, atraumatic, no cyanosis or edema and Homans sign is negative, no sign of DVT  Wound Exam: clean, dry, intact   Drainage:  None: wound tissue dry  Motor Exam: EHL and FHL Intact  Sensory Exam: Deep Peroneal normal   Assessment:    3 Days Post-Op  Procedure(s) (LRB): TOTAL KNEE ARTHROPLASTY (Right)  ADDITIONAL DIAGNOSIS:  Active Problems:   * No active hospital problems. *  Acute Blood Loss Anemia   Plan: Physical Therapy as ordered Weight Bearing as Tolerated (WBAT)  DVT Prophylaxis:  Lovenox  DISCHARGE PLAN: Skilled Nursing Facility/Rehab  DISCHARGE NEEDS: HHPT, CPM, Walker and  3-in-1 comode seat         Kayron Hicklin 04/12/2013, 12:50 PM

## 2013-04-16 ENCOUNTER — Encounter: Payer: Self-pay | Admitting: Internal Medicine

## 2013-04-16 ENCOUNTER — Non-Acute Institutional Stay (SKILLED_NURSING_FACILITY): Payer: Medicare Other | Admitting: Internal Medicine

## 2013-04-16 ENCOUNTER — Other Ambulatory Visit: Payer: Self-pay

## 2013-04-16 DIAGNOSIS — Z96659 Presence of unspecified artificial knee joint: Secondary | ICD-10-CM | POA: Diagnosis not present

## 2013-04-16 DIAGNOSIS — Z96651 Presence of right artificial knee joint: Secondary | ICD-10-CM

## 2013-04-16 DIAGNOSIS — I10 Essential (primary) hypertension: Secondary | ICD-10-CM | POA: Insufficient documentation

## 2013-04-16 DIAGNOSIS — M1990 Unspecified osteoarthritis, unspecified site: Secondary | ICD-10-CM | POA: Diagnosis not present

## 2013-04-16 MED ORDER — OXYCODONE HCL 5 MG PO CAPS: 5.0000 mg | ORAL_CAPSULE | ORAL | Status: DC | PRN

## 2013-04-16 NOTE — Assessment & Plan Note (Signed)
Surgery went well on 11/17;pt on lovenox for prophylaxis

## 2013-04-16 NOTE — Progress Notes (Signed)
MRN: [id number] Name: Maria Rowland  Sex: female Age: 72 y.o. DOB: May 29, 1940  PSC #: [facility] Facility/Room:210 Level Of Care: SNF Provider: Merrilee Seashore D Emergency Contacts: Extended Emergency Contact Information Primary Emergency Contact: Evelina Dun [place] Home Phone: [phone] Relation: Daughter  Code Status: FULL  Allergies: Mobic and Caffeine  Chief Complaint  Patient presents with  . nursoing home admission    HPI: Patient is 72 y.o. female who is admitted for OT/PT after total R knee arthroplasty.  Past Medical History  Diagnosis Date  . Irregular heart beat   . Shortness of breath     when she has palpitations  . Constipation   . S/P total knee arthroplasty   . HTN (hypertension)   . Osteoarthritis     Past Surgical History  Procedure Laterality Date  . Cholecystectomy    . Carpal tunnel release    . Hip replacement 2011    . Surgical repair of left hand    . Appendectomy    . Tonsillectomy    . Colonoscopy    . Eye surgery Right     cataract with lens implant  . Total knee arthroplasty Right 04/09/2013    Procedure: TOTAL KNEE ARTHROPLASTY;  Surgeon: Dannielle Huh, MD;  Location: [facility] OR;  Service: Orthopedics;  Laterality: Right;      Medication List       This list is accurate as of: 04/16/13 11:15 AM.  Always use your most recent med list.               acetaminophen 650 MG CR tablet  Commonly known as:  TYLENOL  Take 650 mg by mouth 2 (two) times daily.     CALCIUM & VIT D3 BONE HEALTH PO  Take 1 tablet by mouth 2 (two) times daily.     CENTRUM SILVER ADULT 50+ PO  Take by mouth daily.     enoxaparin 40 MG/0.4ML injection  Commonly known as:  LOVENOX  Inject 0.4 mLs (40 mg total) into the skin daily.     METAMUCIL PO  Take 1 application by mouth 2 (two) times daily.     methocarbamol 500 MG tablet  Commonly known as:  ROBAXIN  Take 1-2 tablets (500-1,000 mg total) by mouth every 6 (six) hours as  needed for muscle spasms.     metoprolol succinate 50 MG 24 hr tablet  Commonly known as:  TOPROL-XL  Take 50 mg by mouth daily.     Oxycodone HCl 10 MG Tabs  Take 1 tablet (10 mg total) by mouth every 4 (four) hours as needed.        No orders of the defined types were placed in this encounter.     There is no immunization history on file for this patient.  History  Substance Use Topics  . Smoking status: Never Smoker   . Smokeless tobacco: Never Used  . Alcohol Use: No    Family history is noncontributory    Review of Systems  DATA OBTAINED: from patient GENERAL: Feels well no fevers, fatigue, appetite changes SKIN: No itching, rash or wounds EYES: No eye pain, redness, discharge EARS: No earache, tinnitus, change in hearing NOSE: No congestion, drainage or bleeding  MOUTH/THROAT: No mouth or tooth pain, No sore throat, No difficulty chewing or swallowing  RESPIRATORY: No cough, wheezing, SOB CARDIAC: No chest pain, palpitations, lower extremity edema  GI: No abdominal pain, No N/V/D or constipation, No heartburn or reflux  GU:  No dysuria, frequency or urgency, or incontinence  MUSCULOSKELETAL: No unrelieved bone/joint pain NEUROLOGIC: No headache, dizziness or focal weakness PSYCHIATRIC: No overt anxiety or sadness. Sleeps well. No behavior issue.   Filed Vitals:   04/16/13 1037  BP: 104/67  Pulse: 72  Temp: 97 F (36.1 C)  Resp: 18    Physical Exam  GENERAL APPEARANCE: Alert, conversant. Appropriately groomed. No acute distress. SPOKE WITH PATIENT ABOUT USING OXYCODONE 5 MG INSTEAD OF 10 MG AND SHE LIKES THE IDEA;SHE WANTS TO GET OFF IT SKIN: No diaphoresis rash, or wounds HEAD: Normocephalic, atraumatic  EYES: Conjunctiva/lids clear. Pupils round, reactive. EOMs intact.  EARS: External exam WNL, canals clear. Hearing grossly normal.  NOSE: No deformity or discharge.  MOUTH/THROAT: Lips w/o lesions  RESPIRATORY: Breathing is even, unlabored. Lung  sounds are clear   CARDIOVASCULAR: Heart RRR no murmurs, rubs or gallops.+ pvc's; No peripheral edema.  ARTERIAL: radial pulse 2+, DP pulse 1+  VENOUS: No varicosities. No venous stasis skin changes  GASTROINTESTINAL: Abdomen is soft, non-tender, not distended w/ normal bowel sounds GENITOURINARY: Bladder non tender, not distended  MUSCULOSKELETAL: No abnormal joints or musculature; pt has on an appliance that passively flexes and extends her knee NEUROLOGIC: Oriented X3. Cranial nerves 2-12 grossly intact. Moves all extremities no tremor. PSYCHIATRIC: Mood and affect appropriate to situation, no behavioral issues  Patient Active Problem List   Diagnosis Date Noted  . S/P total knee arthroplasty   . HTN (hypertension)   . Osteoarthritis   . PVC (premature ventricular contraction) 03/13/2013  . Other specified cardiac dysrhythmias(427.89) 03/13/2013    CBC    Component Value Date/Time   WBC 7.5 04/12/2013 0434   RBC 3.08* 04/12/2013 0434   HGB 9.7* 04/12/2013 0434   HCT 28.2* 04/12/2013 0434   PLT 218 04/12/2013 0434   MCV 91.6 04/12/2013 0434   LYMPHSABS 2.9 04/03/2013 1442   MONOABS 0.6 04/03/2013 1442   EOSABS 0.2 04/03/2013 1442   BASOSABS 0.0 04/03/2013 1442    CMP     Component Value Date/Time   NA 135 04/12/2013 0434   K 4.0 04/12/2013 0434   CL 102 04/12/2013 0434   CO2 27 04/12/2013 0434   GLUCOSE 105* 04/12/2013 0434   BUN 6 04/12/2013 0434   CREATININE 0.52 04/12/2013 0434   CALCIUM 8.3* 04/12/2013 0434   PROT 7.2 04/03/2013 1442   ALBUMIN 4.0 04/03/2013 1442   AST 31 04/03/2013 1442   ALT 36* 04/03/2013 1442   ALKPHOS 83 04/03/2013 1442   BILITOT 0.3 04/03/2013 1442   GFRNONAA >90 04/12/2013 0434   GFRAA >90 04/12/2013 0434    Assessment and Plan  S/P total knee arthroplasty Surgery went well on 11/17;pt on lovenox for prophylaxis  HTN (hypertension) Controlled on metoprolol 50 mg XL  Osteoarthritis Pt can't take NSAID so is on Robaxin AND  TYLENOL     Margit Hanks, MD

## 2013-04-16 NOTE — Assessment & Plan Note (Signed)
Controlled on metoprolol 50 mg XL

## 2013-04-16 NOTE — Assessment & Plan Note (Signed)
Pt can't take NSAID so is on Robaxin AND TYLENOL

## 2013-04-17 DIAGNOSIS — I10 Essential (primary) hypertension: Secondary | ICD-10-CM | POA: Diagnosis not present

## 2013-04-17 DIAGNOSIS — Z5189 Encounter for other specified aftercare: Secondary | ICD-10-CM | POA: Diagnosis not present

## 2013-04-17 DIAGNOSIS — Z96659 Presence of unspecified artificial knee joint: Secondary | ICD-10-CM | POA: Diagnosis not present

## 2013-04-17 DIAGNOSIS — Z7901 Long term (current) use of anticoagulants: Secondary | ICD-10-CM | POA: Diagnosis not present

## 2013-04-17 DIAGNOSIS — Z471 Aftercare following joint replacement surgery: Secondary | ICD-10-CM | POA: Diagnosis not present

## 2013-04-18 DIAGNOSIS — Z471 Aftercare following joint replacement surgery: Secondary | ICD-10-CM | POA: Diagnosis not present

## 2013-04-18 DIAGNOSIS — Z7901 Long term (current) use of anticoagulants: Secondary | ICD-10-CM | POA: Diagnosis not present

## 2013-04-18 DIAGNOSIS — Z96659 Presence of unspecified artificial knee joint: Secondary | ICD-10-CM | POA: Diagnosis not present

## 2013-04-18 DIAGNOSIS — I10 Essential (primary) hypertension: Secondary | ICD-10-CM | POA: Diagnosis not present

## 2013-04-18 DIAGNOSIS — Z5189 Encounter for other specified aftercare: Secondary | ICD-10-CM | POA: Diagnosis not present

## 2013-04-20 DIAGNOSIS — Z5189 Encounter for other specified aftercare: Secondary | ICD-10-CM | POA: Diagnosis not present

## 2013-04-20 DIAGNOSIS — Z7901 Long term (current) use of anticoagulants: Secondary | ICD-10-CM | POA: Diagnosis not present

## 2013-04-20 DIAGNOSIS — Z96659 Presence of unspecified artificial knee joint: Secondary | ICD-10-CM | POA: Diagnosis not present

## 2013-04-20 DIAGNOSIS — Z471 Aftercare following joint replacement surgery: Secondary | ICD-10-CM | POA: Diagnosis not present

## 2013-04-20 DIAGNOSIS — I10 Essential (primary) hypertension: Secondary | ICD-10-CM | POA: Diagnosis not present

## 2013-04-23 DIAGNOSIS — Z5189 Encounter for other specified aftercare: Secondary | ICD-10-CM | POA: Diagnosis not present

## 2013-04-23 DIAGNOSIS — Z471 Aftercare following joint replacement surgery: Secondary | ICD-10-CM | POA: Diagnosis not present

## 2013-04-23 DIAGNOSIS — Z96659 Presence of unspecified artificial knee joint: Secondary | ICD-10-CM | POA: Diagnosis not present

## 2013-04-23 DIAGNOSIS — I10 Essential (primary) hypertension: Secondary | ICD-10-CM | POA: Diagnosis not present

## 2013-04-23 DIAGNOSIS — Z7901 Long term (current) use of anticoagulants: Secondary | ICD-10-CM | POA: Diagnosis not present

## 2013-04-24 DIAGNOSIS — Z96659 Presence of unspecified artificial knee joint: Secondary | ICD-10-CM | POA: Diagnosis not present

## 2013-04-24 DIAGNOSIS — Z471 Aftercare following joint replacement surgery: Secondary | ICD-10-CM | POA: Diagnosis not present

## 2013-04-25 DIAGNOSIS — I10 Essential (primary) hypertension: Secondary | ICD-10-CM | POA: Diagnosis not present

## 2013-04-25 DIAGNOSIS — Z5189 Encounter for other specified aftercare: Secondary | ICD-10-CM | POA: Diagnosis not present

## 2013-04-25 DIAGNOSIS — Z7901 Long term (current) use of anticoagulants: Secondary | ICD-10-CM | POA: Diagnosis not present

## 2013-04-25 DIAGNOSIS — Z471 Aftercare following joint replacement surgery: Secondary | ICD-10-CM | POA: Diagnosis not present

## 2013-04-25 DIAGNOSIS — Z96659 Presence of unspecified artificial knee joint: Secondary | ICD-10-CM | POA: Diagnosis not present

## 2013-04-27 DIAGNOSIS — Z5189 Encounter for other specified aftercare: Secondary | ICD-10-CM | POA: Diagnosis not present

## 2013-04-27 DIAGNOSIS — I10 Essential (primary) hypertension: Secondary | ICD-10-CM | POA: Diagnosis not present

## 2013-04-27 DIAGNOSIS — Z7901 Long term (current) use of anticoagulants: Secondary | ICD-10-CM | POA: Diagnosis not present

## 2013-04-27 DIAGNOSIS — Z471 Aftercare following joint replacement surgery: Secondary | ICD-10-CM | POA: Diagnosis not present

## 2013-04-27 DIAGNOSIS — Z96659 Presence of unspecified artificial knee joint: Secondary | ICD-10-CM | POA: Diagnosis not present

## 2013-04-30 DIAGNOSIS — Z96659 Presence of unspecified artificial knee joint: Secondary | ICD-10-CM | POA: Diagnosis not present

## 2013-04-30 DIAGNOSIS — I10 Essential (primary) hypertension: Secondary | ICD-10-CM | POA: Diagnosis not present

## 2013-04-30 DIAGNOSIS — Z5189 Encounter for other specified aftercare: Secondary | ICD-10-CM | POA: Diagnosis not present

## 2013-04-30 DIAGNOSIS — Z471 Aftercare following joint replacement surgery: Secondary | ICD-10-CM | POA: Diagnosis not present

## 2013-04-30 DIAGNOSIS — Z7901 Long term (current) use of anticoagulants: Secondary | ICD-10-CM | POA: Diagnosis not present

## 2013-05-02 DIAGNOSIS — Z471 Aftercare following joint replacement surgery: Secondary | ICD-10-CM | POA: Diagnosis not present

## 2013-05-02 DIAGNOSIS — Z7901 Long term (current) use of anticoagulants: Secondary | ICD-10-CM | POA: Diagnosis not present

## 2013-05-02 DIAGNOSIS — Z96659 Presence of unspecified artificial knee joint: Secondary | ICD-10-CM | POA: Diagnosis not present

## 2013-05-02 DIAGNOSIS — Z5189 Encounter for other specified aftercare: Secondary | ICD-10-CM | POA: Diagnosis not present

## 2013-05-02 DIAGNOSIS — I10 Essential (primary) hypertension: Secondary | ICD-10-CM | POA: Diagnosis not present

## 2013-05-04 DIAGNOSIS — Z5189 Encounter for other specified aftercare: Secondary | ICD-10-CM | POA: Diagnosis not present

## 2013-05-04 DIAGNOSIS — Z471 Aftercare following joint replacement surgery: Secondary | ICD-10-CM | POA: Diagnosis not present

## 2013-05-04 DIAGNOSIS — Z96659 Presence of unspecified artificial knee joint: Secondary | ICD-10-CM | POA: Diagnosis not present

## 2013-05-04 DIAGNOSIS — Z7901 Long term (current) use of anticoagulants: Secondary | ICD-10-CM | POA: Diagnosis not present

## 2013-05-04 DIAGNOSIS — I10 Essential (primary) hypertension: Secondary | ICD-10-CM | POA: Diagnosis not present

## 2013-05-07 DIAGNOSIS — Z471 Aftercare following joint replacement surgery: Secondary | ICD-10-CM | POA: Diagnosis not present

## 2013-05-07 DIAGNOSIS — I10 Essential (primary) hypertension: Secondary | ICD-10-CM | POA: Diagnosis not present

## 2013-05-07 DIAGNOSIS — Z5189 Encounter for other specified aftercare: Secondary | ICD-10-CM | POA: Diagnosis not present

## 2013-05-07 DIAGNOSIS — Z7901 Long term (current) use of anticoagulants: Secondary | ICD-10-CM | POA: Diagnosis not present

## 2013-05-07 DIAGNOSIS — Z96659 Presence of unspecified artificial knee joint: Secondary | ICD-10-CM | POA: Diagnosis not present

## 2013-05-09 DIAGNOSIS — Z7901 Long term (current) use of anticoagulants: Secondary | ICD-10-CM | POA: Diagnosis not present

## 2013-05-09 DIAGNOSIS — I10 Essential (primary) hypertension: Secondary | ICD-10-CM | POA: Diagnosis not present

## 2013-05-09 DIAGNOSIS — Z5189 Encounter for other specified aftercare: Secondary | ICD-10-CM | POA: Diagnosis not present

## 2013-05-09 DIAGNOSIS — Z471 Aftercare following joint replacement surgery: Secondary | ICD-10-CM | POA: Diagnosis not present

## 2013-05-09 DIAGNOSIS — Z96659 Presence of unspecified artificial knee joint: Secondary | ICD-10-CM | POA: Diagnosis not present

## 2013-05-11 DIAGNOSIS — Z7901 Long term (current) use of anticoagulants: Secondary | ICD-10-CM | POA: Diagnosis not present

## 2013-05-11 DIAGNOSIS — Z5189 Encounter for other specified aftercare: Secondary | ICD-10-CM | POA: Diagnosis not present

## 2013-05-11 DIAGNOSIS — Z96659 Presence of unspecified artificial knee joint: Secondary | ICD-10-CM | POA: Diagnosis not present

## 2013-05-11 DIAGNOSIS — Z471 Aftercare following joint replacement surgery: Secondary | ICD-10-CM | POA: Diagnosis not present

## 2013-05-11 DIAGNOSIS — I10 Essential (primary) hypertension: Secondary | ICD-10-CM | POA: Diagnosis not present

## 2013-05-14 ENCOUNTER — Ambulatory Visit: Payer: Medicare Other | Attending: Orthopedic Surgery | Admitting: Physical Therapy

## 2013-05-14 DIAGNOSIS — R609 Edema, unspecified: Secondary | ICD-10-CM | POA: Insufficient documentation

## 2013-05-14 DIAGNOSIS — M25569 Pain in unspecified knee: Secondary | ICD-10-CM | POA: Insufficient documentation

## 2013-05-14 DIAGNOSIS — R262 Difficulty in walking, not elsewhere classified: Secondary | ICD-10-CM | POA: Insufficient documentation

## 2013-05-14 DIAGNOSIS — M25669 Stiffness of unspecified knee, not elsewhere classified: Secondary | ICD-10-CM | POA: Diagnosis not present

## 2013-05-14 DIAGNOSIS — IMO0001 Reserved for inherently not codable concepts without codable children: Secondary | ICD-10-CM | POA: Diagnosis not present

## 2013-05-22 ENCOUNTER — Ambulatory Visit: Payer: Medicare Other | Admitting: Physical Therapy

## 2013-05-22 DIAGNOSIS — M25569 Pain in unspecified knee: Secondary | ICD-10-CM | POA: Diagnosis not present

## 2013-05-22 DIAGNOSIS — IMO0001 Reserved for inherently not codable concepts without codable children: Secondary | ICD-10-CM | POA: Diagnosis not present

## 2013-05-22 DIAGNOSIS — R262 Difficulty in walking, not elsewhere classified: Secondary | ICD-10-CM | POA: Diagnosis not present

## 2013-05-22 DIAGNOSIS — R609 Edema, unspecified: Secondary | ICD-10-CM | POA: Diagnosis not present

## 2013-05-22 DIAGNOSIS — M25669 Stiffness of unspecified knee, not elsewhere classified: Secondary | ICD-10-CM | POA: Diagnosis not present

## 2013-05-28 ENCOUNTER — Ambulatory Visit: Payer: Medicare Other | Attending: Orthopedic Surgery | Admitting: Physical Therapy

## 2013-05-28 DIAGNOSIS — R262 Difficulty in walking, not elsewhere classified: Secondary | ICD-10-CM | POA: Insufficient documentation

## 2013-05-28 DIAGNOSIS — R609 Edema, unspecified: Secondary | ICD-10-CM | POA: Insufficient documentation

## 2013-05-28 DIAGNOSIS — M25569 Pain in unspecified knee: Secondary | ICD-10-CM | POA: Diagnosis not present

## 2013-05-28 DIAGNOSIS — IMO0001 Reserved for inherently not codable concepts without codable children: Secondary | ICD-10-CM | POA: Insufficient documentation

## 2013-05-28 DIAGNOSIS — M25669 Stiffness of unspecified knee, not elsewhere classified: Secondary | ICD-10-CM | POA: Diagnosis not present

## 2013-05-31 ENCOUNTER — Ambulatory Visit: Payer: Medicare Other | Admitting: Rehabilitation

## 2013-06-04 ENCOUNTER — Ambulatory Visit: Payer: Medicare Other | Admitting: Physical Therapy

## 2013-06-07 ENCOUNTER — Ambulatory Visit: Payer: Medicare Other | Admitting: Physical Therapy

## 2013-06-11 ENCOUNTER — Ambulatory Visit: Payer: Medicare Other | Admitting: Physical Therapy

## 2013-06-14 ENCOUNTER — Ambulatory Visit: Payer: Medicare Other | Admitting: Rehabilitation

## 2013-06-15 ENCOUNTER — Ambulatory Visit: Payer: Medicare Other | Admitting: Physical Therapy

## 2013-06-18 ENCOUNTER — Ambulatory Visit: Payer: Medicare Other | Admitting: Physical Therapy

## 2013-06-21 ENCOUNTER — Ambulatory Visit: Payer: Medicare Other | Admitting: Physical Therapy

## 2013-06-24 ENCOUNTER — Other Ambulatory Visit: Payer: Self-pay | Admitting: Internal Medicine

## 2013-06-25 ENCOUNTER — Ambulatory Visit: Payer: Medicare Other | Attending: Orthopedic Surgery | Admitting: Physical Therapy

## 2013-06-25 DIAGNOSIS — R262 Difficulty in walking, not elsewhere classified: Secondary | ICD-10-CM | POA: Diagnosis not present

## 2013-06-25 DIAGNOSIS — M25569 Pain in unspecified knee: Secondary | ICD-10-CM | POA: Insufficient documentation

## 2013-06-25 DIAGNOSIS — IMO0001 Reserved for inherently not codable concepts without codable children: Secondary | ICD-10-CM | POA: Insufficient documentation

## 2013-06-25 DIAGNOSIS — R609 Edema, unspecified: Secondary | ICD-10-CM | POA: Diagnosis not present

## 2013-06-25 DIAGNOSIS — M25669 Stiffness of unspecified knee, not elsewhere classified: Secondary | ICD-10-CM | POA: Diagnosis not present

## 2013-06-28 ENCOUNTER — Ambulatory Visit: Payer: Medicare Other | Admitting: Rehabilitation

## 2013-07-02 ENCOUNTER — Ambulatory Visit: Payer: Medicare Other | Admitting: Rehabilitation

## 2013-07-03 ENCOUNTER — Other Ambulatory Visit: Payer: Self-pay | Admitting: Internal Medicine

## 2013-07-05 ENCOUNTER — Ambulatory Visit: Payer: Medicare Other | Admitting: Physical Therapy

## 2013-07-09 ENCOUNTER — Ambulatory Visit: Payer: Medicare Other | Admitting: Rehabilitation

## 2013-07-12 ENCOUNTER — Ambulatory Visit: Payer: Medicare Other | Admitting: Rehabilitation

## 2013-08-30 DIAGNOSIS — I10 Essential (primary) hypertension: Secondary | ICD-10-CM | POA: Diagnosis not present

## 2013-08-30 DIAGNOSIS — Z23 Encounter for immunization: Secondary | ICD-10-CM | POA: Diagnosis not present

## 2013-09-21 DIAGNOSIS — M25569 Pain in unspecified knee: Secondary | ICD-10-CM | POA: Diagnosis not present

## 2014-02-28 DIAGNOSIS — Z1389 Encounter for screening for other disorder: Secondary | ICD-10-CM | POA: Diagnosis not present

## 2014-02-28 DIAGNOSIS — Z23 Encounter for immunization: Secondary | ICD-10-CM | POA: Diagnosis not present

## 2014-02-28 DIAGNOSIS — Z0000 Encounter for general adult medical examination without abnormal findings: Secondary | ICD-10-CM | POA: Diagnosis not present

## 2014-02-28 DIAGNOSIS — I493 Ventricular premature depolarization: Secondary | ICD-10-CM | POA: Diagnosis not present

## 2014-02-28 DIAGNOSIS — I10 Essential (primary) hypertension: Secondary | ICD-10-CM | POA: Diagnosis not present

## 2014-02-28 DIAGNOSIS — R634 Abnormal weight loss: Secondary | ICD-10-CM | POA: Diagnosis not present

## 2014-03-22 DIAGNOSIS — M25561 Pain in right knee: Secondary | ICD-10-CM | POA: Diagnosis not present

## 2014-03-22 DIAGNOSIS — Z96651 Presence of right artificial knee joint: Secondary | ICD-10-CM | POA: Diagnosis not present

## 2014-03-26 DIAGNOSIS — H43813 Vitreous degeneration, bilateral: Secondary | ICD-10-CM | POA: Diagnosis not present

## 2014-03-26 DIAGNOSIS — H524 Presbyopia: Secondary | ICD-10-CM | POA: Diagnosis not present

## 2014-03-26 DIAGNOSIS — H25012 Cortical age-related cataract, left eye: Secondary | ICD-10-CM | POA: Diagnosis not present

## 2014-03-26 DIAGNOSIS — H2512 Age-related nuclear cataract, left eye: Secondary | ICD-10-CM | POA: Diagnosis not present

## 2014-04-23 IMAGING — CR DG CHEST 2V
2 series · 2 of 2 positions shown · non-contrast
Comparison: None available

CLINICAL DATA: Preop knee replacement surgery

EXAM:
CHEST - 2 VIEW

[w chest pa]
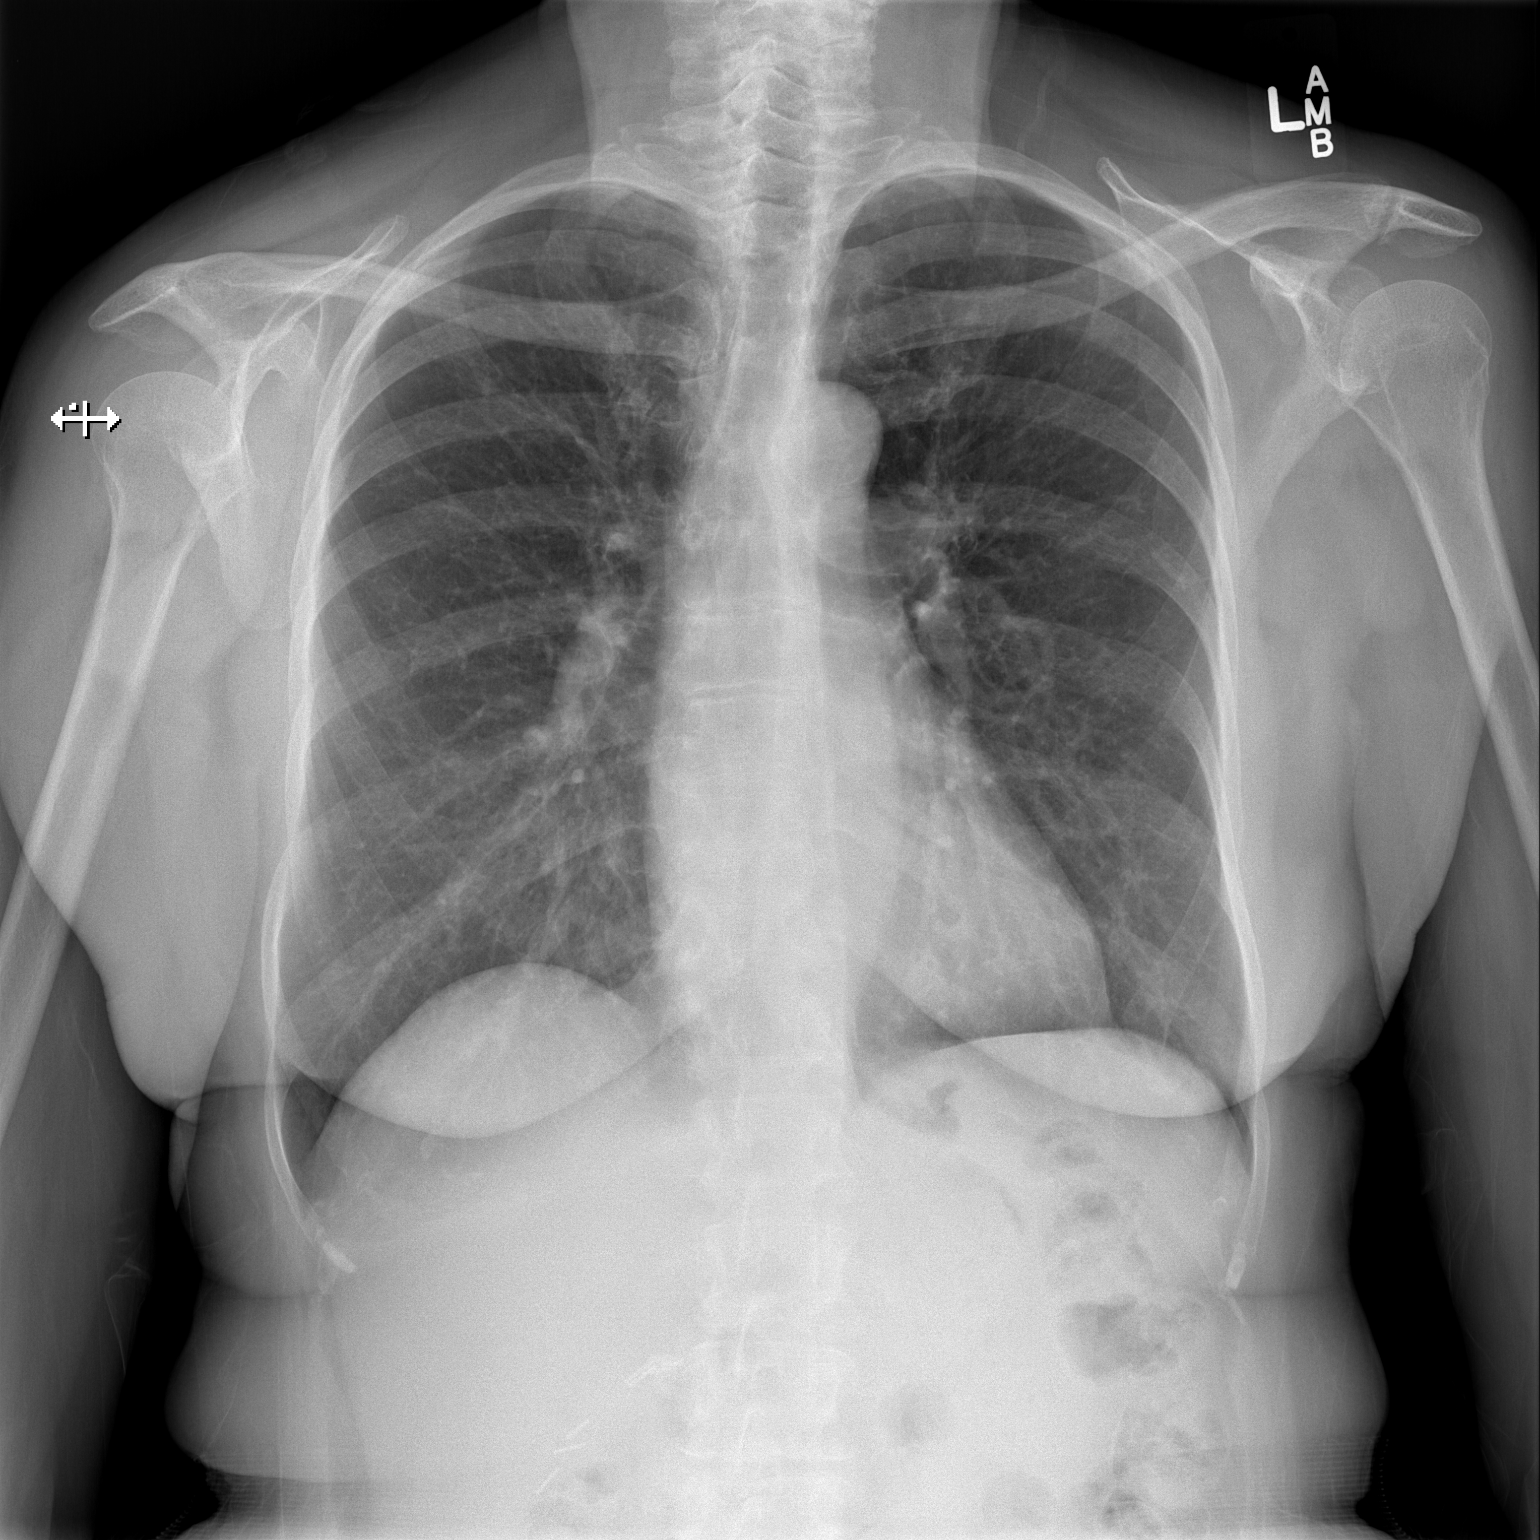

[w chest lat]
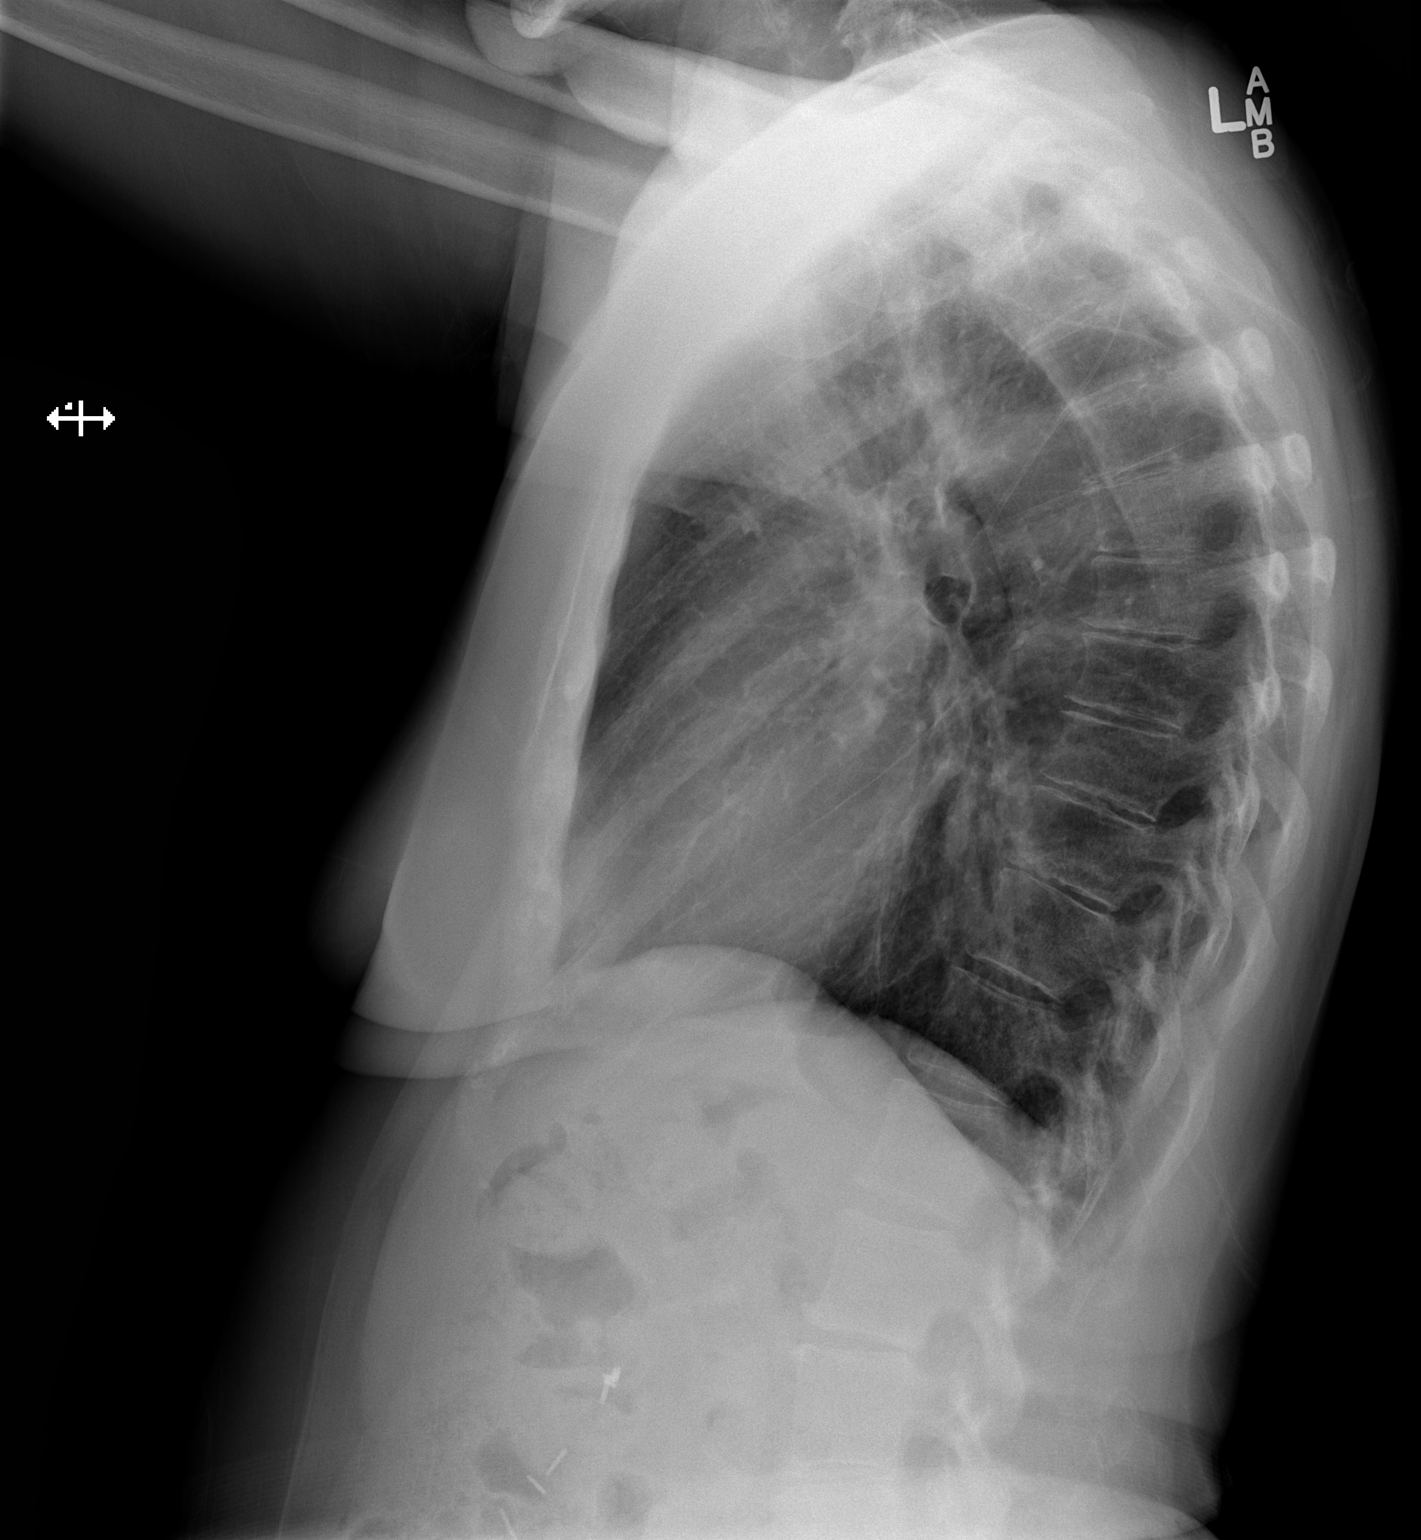

[2 of 2 positions shown; findings below may reference images not displayed]

FINDINGS: The heart size and mediastinal contours are within normal limits.
Both lungs are clear. The visualized skeletal structures are
unremarkable. No effusion. Surgical clips in the right mid abdomen.
IMPRESSION: No acute cardiopulmonary disease.

## 2014-05-20 DIAGNOSIS — H25012 Cortical age-related cataract, left eye: Secondary | ICD-10-CM | POA: Diagnosis not present

## 2014-05-20 DIAGNOSIS — H2512 Age-related nuclear cataract, left eye: Secondary | ICD-10-CM | POA: Diagnosis not present

## 2014-05-28 ENCOUNTER — Ambulatory Visit (INDEPENDENT_AMBULATORY_CARE_PROVIDER_SITE_OTHER): Payer: Medicare Other | Admitting: Interventional Cardiology

## 2014-05-28 ENCOUNTER — Encounter: Payer: Self-pay | Admitting: Interventional Cardiology

## 2014-05-28 VITALS — BP 158/84 | HR 63 | Ht 63.0 in | Wt 149.0 lb

## 2014-05-28 DIAGNOSIS — I10 Essential (primary) hypertension: Secondary | ICD-10-CM

## 2014-05-28 DIAGNOSIS — I493 Ventricular premature depolarization: Secondary | ICD-10-CM | POA: Diagnosis not present

## 2014-05-28 NOTE — Patient Instructions (Signed)
Your physician recommends that you schedule a follow-up appointment as needed with Dr. Varanasi.  

## 2014-05-28 NOTE — Progress Notes (Signed)
Patient ID: Maria Rowland, female   DOB: September 15, 1940, 74 y.o.   MRN: [id number] Patient ID: Maria Rowland, female   DOB: March 29, 1941, 74 y.o.   MRN: [id number]    [place], [place], [place]  [address] Phone: [phone] Fax:  [phone]  Date:  05/28/2014   ID:  Maria Rowland, DOB [birth year], MRN [id number]  PCP:  Cloyd Stagers, MD      History of Present Illness: Jazzma Neidhardt is a 74 y.o. female who has had PVCs over the past many years. SHe has had lightheadedness intermittently. In August 2013, she had an episode while standing in line. She had been drinking a lot of coffee at the time. She has decreased caffeine intake since that time. She has less PVCs as well with less caffeine. She has had some SHOB as well. That comes on with heat and sudden exercise. Better if the weather is cool. No chest pain.  No sx of PVCs anymore , since stopping caffeine.  She walks a little without CP or SHOB.  She walks up stairs without any cardiac sx.  Knee is better after TKR in 03/2013.  She is more mobile.      Wt Readings from Last 3 Encounters:  05/28/14 149 lb (67.586 kg)  04/09/13 157 lb (71.215 kg)  04/03/13 157 lb 4.8 oz (71.351 kg)     Past Medical History  Diagnosis Date  . Irregular heart beat   . Shortness of breath     when she has palpitations  . Constipation   . S/P total knee arthroplasty   . HTN (hypertension)   . Osteoarthritis     Current Outpatient Prescriptions  Medication Sig Dispense Refill  . metoprolol succinate (TOPROL-XL) 50 MG 24 hr tablet Take 50 mg by mouth daily.     . Multiple Minerals-Vitamins (CALCIUM & VIT D3 BONE HEALTH PO) Take 1 tablet by mouth 2 (two) times daily.    . Multiple Vitamins-Minerals (CENTRUM SILVER ADULT 50+ PO) Take by mouth daily.    . naproxen sodium (ANAPROX) 220 MG tablet Take 220 mg by mouth 2 (two) times daily with a meal.    . Psyllium (METAMUCIL PO) Take 1 application by mouth 2 (two) times daily.      No current facility-administered medications for this visit.    Allergies:    Allergies  Allergen Reactions  . Mobic [Meloxicam] Other (See Comments)    Transaminitis   . Caffeine Palpitations    Rapid heartrate    Social History:  The patient  reports that she has never smoked. She has never used smokeless tobacco. She reports that she does not drink alcohol or use illicit drugs.   Family History:  The patient's family history includes Arthritis in her brother; COPD in her brother and brother; Cancer - Prostate in her father; Dementia in her mother; Diabetes in her brother; Hypertension in her mother.   ROS:  Please see the history of present illness.  No nausea, vomiting.  No fevers, chills.  No focal weakness.  No dysuria.    All other systems reviewed and negative.   PHYSICAL EXAM: VS:  BP 158/84 mmHg  Pulse 63  Ht 5\' 3"  (1.6 m)  Wt 149 lb (67.586 kg)  BMI 26.40 kg/m2 Well nourished, well developed, in no acute distress HEENT: normal Neck: no JVD, no carotid bruits Cardiac:  normal S1, S2; RRR;  Lungs:  clear to auscultation bilaterally, no wheezing, rhonchi or rales  Abd: soft, nontender, no hepatomegaly Ext: no edema Skin: warm and dry Neuro:   no focal abnormalities noted Psych: normal affect  EKG: Normal     ASSESSMENT AND PLAN:  1.  HTN: High reading today.  She does not remember her home readings.  She will check and let us know.  Continue Toprol. Recheck 144/78. COntinue to monitor.   Could add more medicine if consistently increased. 2. PVCs : Reduced with less caffeine. 3.  PAT: documented on monitor.  No sustained sx.  Avoid caffeine    Signed, Mina Marble, MD, [facility] 05/28/2014 3:46 PM

## 2014-11-19 DIAGNOSIS — Z1231 Encounter for screening mammogram for malignant neoplasm of breast: Secondary | ICD-10-CM | POA: Diagnosis not present

## 2015-03-06 DIAGNOSIS — I10 Essential (primary) hypertension: Secondary | ICD-10-CM | POA: Diagnosis not present

## 2015-03-06 DIAGNOSIS — Z1389 Encounter for screening for other disorder: Secondary | ICD-10-CM | POA: Diagnosis not present

## 2015-03-06 DIAGNOSIS — Z0000 Encounter for general adult medical examination without abnormal findings: Secondary | ICD-10-CM | POA: Diagnosis not present

## 2015-03-06 DIAGNOSIS — Z23 Encounter for immunization: Secondary | ICD-10-CM | POA: Diagnosis not present

## 2015-03-06 DIAGNOSIS — I493 Ventricular premature depolarization: Secondary | ICD-10-CM | POA: Diagnosis not present

## 2015-08-14 DIAGNOSIS — H43813 Vitreous degeneration, bilateral: Secondary | ICD-10-CM | POA: Diagnosis not present

## 2015-08-14 DIAGNOSIS — Z83511 Family history of glaucoma: Secondary | ICD-10-CM | POA: Diagnosis not present

## 2015-08-14 DIAGNOSIS — Z961 Presence of intraocular lens: Secondary | ICD-10-CM | POA: Diagnosis not present

## 2015-08-14 DIAGNOSIS — H52203 Unspecified astigmatism, bilateral: Secondary | ICD-10-CM | POA: Diagnosis not present

## 2016-02-17 DIAGNOSIS — I10 Essential (primary) hypertension: Secondary | ICD-10-CM | POA: Diagnosis not present

## 2016-02-17 DIAGNOSIS — I493 Ventricular premature depolarization: Secondary | ICD-10-CM | POA: Diagnosis not present

## 2016-02-17 DIAGNOSIS — R202 Paresthesia of skin: Secondary | ICD-10-CM | POA: Diagnosis not present

## 2016-02-17 DIAGNOSIS — Z23 Encounter for immunization: Secondary | ICD-10-CM | POA: Diagnosis not present

## 2016-02-17 DIAGNOSIS — M179 Osteoarthritis of knee, unspecified: Secondary | ICD-10-CM | POA: Diagnosis not present

## 2016-03-08 DIAGNOSIS — Z0000 Encounter for general adult medical examination without abnormal findings: Secondary | ICD-10-CM | POA: Diagnosis not present

## 2016-03-08 DIAGNOSIS — I10 Essential (primary) hypertension: Secondary | ICD-10-CM | POA: Diagnosis not present

## 2016-03-08 DIAGNOSIS — M859 Disorder of bone density and structure, unspecified: Secondary | ICD-10-CM | POA: Diagnosis not present

## 2016-03-08 DIAGNOSIS — I493 Ventricular premature depolarization: Secondary | ICD-10-CM | POA: Diagnosis not present

## 2016-03-08 DIAGNOSIS — M179 Osteoarthritis of knee, unspecified: Secondary | ICD-10-CM | POA: Diagnosis not present

## 2016-03-08 DIAGNOSIS — Z1231 Encounter for screening mammogram for malignant neoplasm of breast: Secondary | ICD-10-CM | POA: Diagnosis not present

## 2016-03-08 DIAGNOSIS — Z13220 Encounter for screening for lipoid disorders: Secondary | ICD-10-CM | POA: Diagnosis not present

## 2016-03-08 DIAGNOSIS — Z1211 Encounter for screening for malignant neoplasm of colon: Secondary | ICD-10-CM | POA: Diagnosis not present

## 2016-03-11 DIAGNOSIS — D3709 Neoplasm of uncertain behavior of other specified sites of the oral cavity: Secondary | ICD-10-CM | POA: Diagnosis not present

## 2016-03-26 DIAGNOSIS — Z1231 Encounter for screening mammogram for malignant neoplasm of breast: Secondary | ICD-10-CM | POA: Diagnosis not present

## 2016-06-18 DIAGNOSIS — I10 Essential (primary) hypertension: Secondary | ICD-10-CM | POA: Diagnosis not present

## 2016-06-18 DIAGNOSIS — J101 Influenza due to other identified influenza virus with other respiratory manifestations: Secondary | ICD-10-CM | POA: Diagnosis not present

## 2016-06-18 DIAGNOSIS — B349 Viral infection, unspecified: Secondary | ICD-10-CM | POA: Diagnosis not present

## 2016-06-18 DIAGNOSIS — I493 Ventricular premature depolarization: Secondary | ICD-10-CM | POA: Diagnosis not present

## 2016-07-02 DIAGNOSIS — J101 Influenza due to other identified influenza virus with other respiratory manifestations: Secondary | ICD-10-CM | POA: Diagnosis not present

## 2016-07-02 DIAGNOSIS — R05 Cough: Secondary | ICD-10-CM | POA: Diagnosis not present

## 2016-08-17 DIAGNOSIS — H43813 Vitreous degeneration, bilateral: Secondary | ICD-10-CM | POA: Diagnosis not present

## 2016-08-17 DIAGNOSIS — Z961 Presence of intraocular lens: Secondary | ICD-10-CM | POA: Diagnosis not present

## 2016-08-17 DIAGNOSIS — H524 Presbyopia: Secondary | ICD-10-CM | POA: Diagnosis not present

## 2016-08-17 DIAGNOSIS — H52203 Unspecified astigmatism, bilateral: Secondary | ICD-10-CM | POA: Diagnosis not present

## 2017-02-09 DIAGNOSIS — Z23 Encounter for immunization: Secondary | ICD-10-CM | POA: Diagnosis not present

## 2017-03-17 DIAGNOSIS — Z1211 Encounter for screening for malignant neoplasm of colon: Secondary | ICD-10-CM | POA: Diagnosis not present

## 2017-03-17 DIAGNOSIS — Z1331 Encounter for screening for depression: Secondary | ICD-10-CM | POA: Diagnosis not present

## 2017-03-17 DIAGNOSIS — Z1231 Encounter for screening mammogram for malignant neoplasm of breast: Secondary | ICD-10-CM | POA: Diagnosis not present

## 2017-03-17 DIAGNOSIS — Z0000 Encounter for general adult medical examination without abnormal findings: Secondary | ICD-10-CM | POA: Diagnosis not present

## 2017-03-17 DIAGNOSIS — Z6829 Body mass index (BMI) 29.0-29.9, adult: Secondary | ICD-10-CM | POA: Diagnosis not present

## 2017-03-17 DIAGNOSIS — Z780 Asymptomatic menopausal state: Secondary | ICD-10-CM | POA: Diagnosis not present

## 2017-03-17 DIAGNOSIS — I10 Essential (primary) hypertension: Secondary | ICD-10-CM | POA: Diagnosis not present

## 2017-03-17 DIAGNOSIS — I493 Ventricular premature depolarization: Secondary | ICD-10-CM | POA: Diagnosis not present

## 2017-03-17 DIAGNOSIS — R202 Paresthesia of skin: Secondary | ICD-10-CM | POA: Diagnosis not present

## 2017-03-17 DIAGNOSIS — M179 Osteoarthritis of knee, unspecified: Secondary | ICD-10-CM | POA: Diagnosis not present

## 2017-03-29 DIAGNOSIS — Z1211 Encounter for screening for malignant neoplasm of colon: Secondary | ICD-10-CM | POA: Diagnosis not present

## 2017-05-29 ENCOUNTER — Encounter (HOSPITAL_COMMUNITY): Payer: Self-pay | Admitting: *Deleted

## 2017-05-29 ENCOUNTER — Other Ambulatory Visit: Payer: Self-pay

## 2017-05-29 ENCOUNTER — Emergency Department (HOSPITAL_COMMUNITY)
Admission: EM | Admit: 2017-05-29 | Discharge: 2017-05-29 | Disposition: A | Discharge status: Home / Self Care | Payer: Medicare Other | Attending: Emergency Medicine | Admitting: Emergency Medicine

## 2017-05-29 ENCOUNTER — Emergency Department (HOSPITAL_COMMUNITY): Payer: Medicare Other

## 2017-05-29 DIAGNOSIS — Z79899 Other long term (current) drug therapy: Secondary | ICD-10-CM | POA: Diagnosis not present

## 2017-05-29 DIAGNOSIS — R030 Elevated blood-pressure reading, without diagnosis of hypertension: Secondary | ICD-10-CM | POA: Diagnosis not present

## 2017-05-29 DIAGNOSIS — I491 Atrial premature depolarization: Secondary | ICD-10-CM | POA: Diagnosis not present

## 2017-05-29 DIAGNOSIS — R002 Palpitations: Secondary | ICD-10-CM | POA: Diagnosis not present

## 2017-05-29 DIAGNOSIS — I10 Essential (primary) hypertension: Secondary | ICD-10-CM | POA: Insufficient documentation

## 2017-05-29 DIAGNOSIS — R55 Syncope and collapse: Secondary | ICD-10-CM | POA: Diagnosis present

## 2017-05-29 LAB — CBC
HCT: 37.3 % (ref 36.0–46.0)
Hemoglobin: 12.5 g/dL (ref 12.0–15.0)
MCH: 30.7 pg (ref 26.0–34.0)
MCHC: 33.5 g/dL (ref 30.0–36.0)
MCV: 91.6 fL (ref 78.0–100.0)
Platelets: 275 10*3/uL (ref 150–400)
RBC: 4.07 MIL/uL (ref 3.87–5.11)
RDW: 12.8 % (ref 11.5–15.5)
WBC: 7.1 10*3/uL (ref 4.0–10.5)

## 2017-05-29 LAB — BASIC METABOLIC PANEL
Anion gap: 8 (ref 5–15)
BUN: 13 mg/dL (ref 6–20)
CO2: 26 mmol/L (ref 22–32)
Calcium: 10 mg/dL (ref 8.9–10.3)
Chloride: 101 mmol/L (ref 101–111)
Creatinine, Ser: 0.66 mg/dL (ref 0.44–1.00)
GFR calc Af Amer: >60 mL/min (ref >60)
GFR calc non Af Amer: >60 mL/min (ref >60)
Glucose, Bld: 96 mg/dL (ref 65–99)
Potassium: 4 mmol/L (ref 3.5–5.1)
Sodium: 135 mmol/L (ref 135–145)

## 2017-05-29 LAB — URINALYSIS, ROUTINE W REFLEX MICROSCOPIC
Bilirubin Urine: NEGATIVE
Glucose, UA: NEGATIVE mg/dL
Hgb urine dipstick: NEGATIVE
Ketones, ur: 5 mg/dL — AB
Leukocytes, UA: NEGATIVE
Nitrite: NEGATIVE
Protein, ur: NEGATIVE mg/dL
Specific Gravity, Urine: 1.006 (ref 1.005–1.030)
pH: 8 (ref 5.0–8.0)

## 2017-05-29 LAB — I-STAT TROPONIN, ED
Troponin i, poc: 0 ng/mL (ref 0.00–0.08)
Troponin i, poc: 0.01 ng/mL (ref 0.00–0.08)

## 2017-05-29 LAB — CBG MONITORING, ED
Glucose-Capillary: 65 mg/dL (ref 65–99)
Glucose-Capillary: 82 mg/dL (ref 65–99)

## 2017-05-29 NOTE — ED Notes (Signed)
Pt states she is feeling much better. Ambulated to restroom without difficulty.

## 2017-05-29 NOTE — ED Triage Notes (Signed)
Pt arrived by [facility]. Pt was driving and began feeling lightheaded and had palpitations, near syncopal. Hx of irregular HR.

## 2017-05-29 NOTE — ED Notes (Signed)
Patient transported to X-ray 

## 2017-05-29 NOTE — ED Provider Notes (Signed)
[facility] EMERGENCY DEPARTMENT Provider Note   CSN: [id number] Arrival date & time: 05/29/17  1014     History   Chief Complaint Chief Complaint  Patient presents with  . Near Syncope    HPI Maria Rowland is a 77 y.o. female.  HPI  Maria Rowland is a 77 year old female with a history of PVCs, hypertension, osteoarthritis who presents to the emergency department for evaluation following a presyncopal episode earlier today.  Patient states that she was driving to church early this morning when she suddenly felt lightheaded, short of breath and diaphoretic.  She is unsure of if she felt palpitations at the time.  Denies associated chest pain, diaphoresis, nausea/vomiting.  She states that she had to pull her car over because she was worried she was going to crash. She didn't have a cell phone with her, so she decided to drive to church where she could get help.  Was then transported to the ED via EMS.  States that her symptoms lasted a total of approximately 30 minutes, and resolved during transport.  She denies loss of consciousness or fall.  Reports that she had a similar episode last year, but denies coming to the hospital for it.  States that she occasionally feels palpitations in her chest, but this is a weekly occurrence.  She denies fever, chills, cough, congestion, headache, numbness, weakness, dysarthria, dysphagia, dysuria, hematuria, diarrhea, abdominal pain.  Patient states that she was seen by cardiologist Dr. Irish Lack for irregular heartbeat in the past.  Has not seen him for several years now and states that she takes metoprolol for her heartbeat.  Denies history of MI. Denies exertional chest pain or syncope. Denies tobacco use.  Past Medical History:  Diagnosis Date  . Constipation   . HTN (hypertension)   . Irregular heart beat   . Osteoarthritis   . S/P total knee arthroplasty   . Shortness of breath    when she has palpitations    Patient Active  Problem List   Diagnosis Date Noted  . S/P total knee arthroplasty   . HTN (hypertension)   . Osteoarthritis   . PVC (premature ventricular contraction) 03/13/2013  . Other specified cardiac dysrhythmias(427.89) 03/13/2013    Past Surgical History:  Procedure Laterality Date  . APPENDECTOMY    . CARPAL TUNNEL RELEASE    . CHOLECYSTECTOMY    . COLONOSCOPY    . EYE SURGERY Right    cataract with lens implant  . hip replacement 2011    . surgical repair of left hand    . TONSILLECTOMY    . TOTAL KNEE ARTHROPLASTY Right 04/09/2013   Procedure: TOTAL KNEE ARTHROPLASTY;  Surgeon: Vickey Huger, MD;  Location: [facility];  Service: Orthopedics;  Laterality: Right;    OB History    No data available       Home Medications    Prior to Admission medications   Medication Sig Start Date End Date Taking? Authorizing Provider  metoprolol succinate (TOPROL-XL) 50 MG 24 hr tablet Take 50 mg by mouth daily.  02/23/13   [provider]  Multiple Minerals-Vitamins (CALCIUM & VIT D3 BONE HEALTH PO) Take 1 tablet by mouth 2 (two) times daily.    [provider]  Multiple Vitamins-Minerals (CENTRUM SILVER ADULT 50+ PO) Take by mouth daily.    [provider]  naproxen sodium (ANAPROX) 220 MG tablet Take 220 mg by mouth 2 (two) times daily with a meal.    [provider]  Psyllium (METAMUCIL PO) Take 1 application by mouth 2 (two) times daily.    [provider]    Family History Family History  Problem Relation Age of Onset  . Cancer - Prostate Father   . Hypertension Mother   . Dementia Mother   . Arthritis Brother   . COPD Brother   . Diabetes Brother   . COPD Brother     Social History Social History   Tobacco Use  . Smoking status: Never Smoker  . Smokeless tobacco: Never Used  Substance Use Topics  . Alcohol use: No  . Drug use: No     Allergies   Mobic [meloxicam] and Caffeine   Review of Systems Review of Systems    Constitutional: Negative for chills, fatigue and fever.  HENT: Negative for congestion, rhinorrhea and trouble swallowing.   Respiratory: Positive for shortness of breath.   Cardiovascular: Negative for chest pain, palpitations and leg swelling.  Gastrointestinal: Negative for abdominal pain, diarrhea, nausea and vomiting.  Genitourinary: Negative for difficulty urinating, dysuria, flank pain, frequency and hematuria.  Musculoskeletal: Negative for gait problem.  Skin: Negative for rash and wound.  Neurological: Positive for light-headedness. Negative for speech difficulty, weakness, numbness and headaches.  Psychiatric/Behavioral: Negative for agitation and confusion.     Physical Exam Updated Vital Signs BP (!) 161/75 (BP Location: Right Arm) Comment: Simultaneous filing. User may not have seen previous data.  Pulse 67 Comment: Simultaneous filing. User may not have seen previous data.  Temp 97.9 F (36.6 C) (Oral)   Resp 13 Comment: Simultaneous filing. User may not have seen previous data.  SpO2 99% Comment: Simultaneous filing. User may not have seen previous data.  Physical Exam  Constitutional: She is oriented to person, place, and time. She appears well-developed and well-nourished. No distress.  Sitting comfortably at the bedside. Good color. In no apparent distress.   HENT:  Head: Normocephalic and atraumatic.  Mouth/Throat: Oropharynx is clear and moist. No oropharyngeal exudate.  Eyes: Conjunctivae are normal. Pupils are equal, round, and reactive to light. Right eye exhibits no discharge. Left eye exhibits no discharge.  Neck: Normal range of motion. Neck supple. No JVD present.  Cardiovascular: Exam reveals no friction rub.  No murmur heard. Regular rate, irregular rhythm. Premature beats noted about every 3-4 beats.   Pulmonary/Chest: Effort normal and breath sounds normal. No stridor. No respiratory distress. She has no wheezes. She has no rales.  Abdominal: Soft.  Bowel sounds are normal. She exhibits no mass. There is no tenderness. There is no guarding.  Musculoskeletal: Normal range of motion.  Neurological: She is alert and oriented to person, place, and time. Coordination normal.  Mental Status:  Alert, oriented, thought content appropriate, able to give a coherent history. Speech fluent without evidence of aphasia. Able to follow 2 step commands without difficulty.  Cranial Nerves:  II:  Peripheral visual fields grossly normal, pupils equal, round, reactive to light III,IV, VI: ptosis not present, extra-ocular motions intact bilaterally  V,VII: smile symmetric, facial light touch sensation equal VIII: hearing grossly normal to voice  X: uvula elevates symmetrically  XI: bilateral shoulder shrug symmetric and strong XII: midline tongue extension without fassiculations Motor:  Normal tone. 5/5 in upper and lower extremities bilaterally including strong and equal grip strength and dorsiflexion/plantar flexion Sensory: Pinprick and light touch normal in all extremities.  Deep Tendon Reflexes: 2+ and symmetric in the biceps and patella Cerebellar: normal finger-to-nose with bilateral upper extremities Gait: normal gait  and balance  Skin: Skin is warm and dry. Capillary refill takes less than 2 seconds. She is not diaphoretic. No pallor.  Psychiatric: She has a normal mood and affect. Her behavior is normal.  Nursing note and vitals reviewed.    ED Treatments / Results  Labs (all labs ordered are listed, but only abnormal results are displayed) Labs Reviewed  BASIC METABOLIC PANEL  CBC  URINALYSIS, ROUTINE W REFLEX MICROSCOPIC  CBG MONITORING, ED    EKG  EKG Interpretation  Date/Time:  Sunday May 29 2017 10:14:28 EST Ventricular Rate:  67 PR Interval:    QRS Duration: 96 QT Interval:  392 QTC Calculation: 414 R Axis:   0 Text Interpretation:  Sinus rhythm Atrial premature complex Low voltage, precordial leads Nonspecific repol  abnormality, lateral leads No old tracing to compare Confirmed by Malvin Johns [phone]) on 05/29/2017 10:32:04 AM       Radiology No results found.  Procedures Procedures (including critical care time)  Medications Ordered in ED Medications - No data to display   Initial Impression / Assessment and Plan / ED Course  I have reviewed the triage vital signs and the nursing notes.  Pertinent labs & imaging results that were available during my care of the patient were reviewed by me and considered in my medical decision making (see chart for details).    Patient presents with episode of lightheadedness, shortness of breath and diaphoresis which lasted for about 61min earlier today. She has a history of irregular heartbeat with PVCs. Denies chest pain.   She has no neurological deficits on exam and presentation non-concerning for acute stroke.  EKG reveals several PACs, no evidence of ischemia.  Lab work reveals CBC and BMP WNL.  Chest x-ray without acute abnormality.  Initial troponin negative.  Discussed this patient who was also seen by Dr. Tamera Punt. Plan to consult cardiology for recommendation of whether patient should stay overnight for cardiac monitoring.  Discussed this patient with Cardiologist Dr. Harl Bowie who recommends repeat troponin to evaluate for ACS and if negative will arrange close cardiology follow up.   Serial troponin negative. She is able to ambulate in the hallway independently and denies feeling lightheaded or unsteady. Cardiology is aware of this patient and will call tomorrow for follow up appointment this week. Her blood pressure was elevated in the ED, counseled her to have this rechecked. Discussed return precautions and patient agrees and voices understanding. She appears reliable for follow up. Patient has no complaints prior to discharge.   Final Clinical Impressions(s) / ED Diagnoses   Final diagnoses:  PAC (premature atrial contraction)    ED Discharge  Orders    None       Bernarda Caffey 05/29/17 1548    Malvin Johns, MD 05/29/17 1556

## 2017-05-29 NOTE — Discharge Instructions (Signed)
You had an irregular heart beat in the ER with a few premature beats. Your blood work and Chest X-ray was reassuring. Please follow up closely with cardiology.  You will receive a phone call regarding follow up appointment with cardiology.   Please drink plenty of fluids to stay hydrated.   Please return to the ER if you develop chest pain, shortness of breath, feel light headed as if you are unsteady on your feet or have any new or worsening symptoms.

## 2017-05-29 NOTE — ED Notes (Addendum)
Pt CBG was 65, notified Crystal(RN)

## 2017-05-29 NOTE — ED Notes (Signed)
Pt CBG was 82, notified Crystal(RN)

## 2017-05-31 ENCOUNTER — Ambulatory Visit (INDEPENDENT_AMBULATORY_CARE_PROVIDER_SITE_OTHER): Payer: Medicare Other | Admitting: Interventional Cardiology

## 2017-05-31 ENCOUNTER — Encounter: Payer: Self-pay | Admitting: Interventional Cardiology

## 2017-05-31 ENCOUNTER — Encounter (INDEPENDENT_AMBULATORY_CARE_PROVIDER_SITE_OTHER): Payer: Self-pay

## 2017-05-31 VITALS — BP 134/86 | HR 71 | Ht 63.0 in | Wt 163.2 lb

## 2017-05-31 DIAGNOSIS — R0602 Shortness of breath: Secondary | ICD-10-CM

## 2017-05-31 DIAGNOSIS — R55 Syncope and collapse: Secondary | ICD-10-CM

## 2017-05-31 DIAGNOSIS — R42 Dizziness and giddiness: Secondary | ICD-10-CM | POA: Diagnosis not present

## 2017-05-31 DIAGNOSIS — I493 Ventricular premature depolarization: Secondary | ICD-10-CM | POA: Diagnosis not present

## 2017-05-31 NOTE — Progress Notes (Signed)
Cardiology Office Note   Date:  05/31/2017   ID:  Maria Rowland, DOB 1940-09-02, MRN [id number]  PCP:  Leeroy Cha, MD    No chief complaint on file. presyncope   Wt Readings from Last 3 Encounters:  05/31/17 163 lb 3.2 oz (74 kg)  05/28/14 149 lb (67.6 kg)  04/09/13 157 lb (71.2 kg)       History of Present Illness: Maria Rowland is a 77 y.o. female who is being seen today for the evaluation of presyncope at the request of Leeroy Cha,*.  She was seen in 2016. At that time, She had PVCs spanning back many years. SHe had lightheadedness intermittently. In August 2013, she had an episode while standing in line. She had been drinking a lot of coffee at the time. She decreased caffeine and sx improved.  She had some brief PAT on the monitor at that time.   On 05/30/17 Per the ED record: "Patient states that she was driving to church early this morning when she suddenly felt lightheaded, short of breath and diaphoretic.  She is unsure of if she felt palpitations at the time.  Denies associated chest pain, diaphoresis, nausea/vomiting.  She states that she had to pull her car over because she was worried she was going to crash. She didn't have a cell phone with her, so she decided to drive to church where she could get help.  Was then transported to the ED via EMS.  States that her symptoms lasted a total of approximately 30 minutes, and resolved during transport.  She denies loss of consciousness or fall.  Reports that she had a similar episode last year, but denies coming to the hospital for it.  States that she occasionally feels palpitations in her chest, but this is a weekly occurrence.  She denies fever, chills, cough, congestion, headache, numbness, weakness, dysarthria, dysphagia, dysuria, hematuria, diarrhea, abdominal pain.  Patient states that she was seen by cardiologist Dr. Irish Lack for irregular heartbeat in the past.  Has not seen him for several years now  and states that she takes metoprolol for her heartbeat.  Denies history of MI. Denies exertional chest pain or syncope. Denies tobacco use."  She did have some coffee yesterday and had not slept well the night before.  She continues to not feel well, although does not report any specific chest pain.  SHe has some neck/shoulder arthritis pain which is chronic.  She "does not feel right in her head."  CXR yesterday was ok.   She has not been walking much over the past year. She has gained weight.    Past Medical History:  Diagnosis Date  . Constipation   . HTN (hypertension)   . Irregular heart beat   . Osteoarthritis   . S/P total knee arthroplasty   . Shortness of breath    when she has palpitations    Past Surgical History:  Procedure Laterality Date  . APPENDECTOMY    . CARPAL TUNNEL RELEASE    . CHOLECYSTECTOMY    . COLONOSCOPY    . EYE SURGERY Right    cataract with lens implant  . hip replacement 2011    . surgical repair of left hand    . TONSILLECTOMY    . TOTAL KNEE ARTHROPLASTY Right 04/09/2013   Procedure: TOTAL KNEE ARTHROPLASTY;  Surgeon: Vickey Huger, MD;  Location: [facility];  Service: Orthopedics;  Laterality: Right;     Current Outpatient Medications  Medication Sig Dispense Refill  .  acetaminophen (TYLENOL) 650 MG CR tablet Take 650 mg by mouth every 8 (eight) hours as needed for pain.    . Magnesium 200 MG TABS Take 200 mg by mouth daily.    . metoprolol succinate (TOPROL-XL) 50 MG 24 hr tablet Take 50 mg by mouth daily.     . Psyllium (METAMUCIL PO) Take 1 application by mouth 2 (two) times daily.     No current facility-administered medications for this visit.     Allergies:   Meloxicam and Caffeine    Social History:  The patient  reports that  has never smoked. she has never used smokeless tobacco. She reports that she does not drink alcohol or use drugs.   Family History:  The patient's family history includes Arthritis in her brother; COPD in her  brother and brother; Cancer - Prostate in her father; Dementia in her mother; Diabetes in her brother; Hypertension in her mother.    ROS:  Please see the history of present illness.   Otherwise, review of systems are positive for DOE.   All other systems are reviewed and negative.    PHYSICAL EXAM: VS:  BP 134/86 (BP Location: Left Arm, Patient Position: Sitting, Cuff Size: Normal)   Pulse 71   Ht 5\' 3"  (1.6 m)   Wt 163 lb 3.2 oz (74 kg)   SpO2 98%   BMI 28.91 kg/m  , BMI Body mass index is 28.91 kg/m. GEN: Well nourished, well developed, in no acute distress  HEENT: normal  Neck: no JVD, carotid bruits, or masses Cardiac: RRR; no murmurs, rubs, or gallops,no edema  Respiratory:  clear to auscultation bilaterally, normal work of breathing GI: soft, nontender, nondistended, + BS MS: no deformity or atrophy  Skin: warm and dry, no rash Neuro:  Strength and sensation are intact Psych: euthymic mood, full affect   EKG:   The ekg ordered today demonstrates NSR , no ST segment changes   Recent Labs: 05/29/2017: BUN 13; Creatinine, Ser 0.66; Hemoglobin 12.5; Platelets 275; Potassium 4.0; Sodium 135   Lipid Panel No results found for: CHOL, TRIG, HDL, CHOLHDL, VLDL, LDLCALC, LDLDIRECT   Other studies Reviewed: Additional studies/ records that were reviewed today with results demonstrating: prior monitor showed PAT.   ASSESSMENT AND PLAN:  1. DOE/ palpitations/presyncope: We will check echocardiogram to evaluate for any structural heart disease.  She could have had a short episode of PAT although this was not captured on the monitor.  If ejection fraction is dropped, would have to pursue workup.  She is going to go back to avoiding caffeine to see if she feels better.  Stay well hydrated. 2. She needs to resume exercise with the target below.   3. PACs/PVCs: Chronic. 4. I spoke to Dr. Paulita Fujita.  Colonoscopy will be postponed since it is routine.   I informed the  patient.   Current medicines are reviewed at length with the patient today.  The patient concerns regarding her medicines were addressed.  The following changes have been made:  No change  Labs/ tests ordered today include:  No orders of the defined types were placed in this encounter.   Recommend 150 minutes/week of aerobic exercise Low fat, low carb, high fiber diet recommended  Disposition:   FU in  3 months    Signed, Larae Grooms, MD  05/31/2017 8:49 AM    [facility] [place], [place],   [address] Phone: [phone]; Fax: [phone]

## 2017-05-31 NOTE — Patient Instructions (Signed)
Medication Instructions:  Your physician recommends that you continue on your current medications as directed. Please refer to the Current Medication list given to you today.   Labwork: None ordered  Testing/Procedures: Your physician has requested that you have an echocardiogram. Echocardiography is a painless test that uses sound waves to create images of your heart. It provides your doctor with information about the size and shape of your heart and how well your heart's chambers and valves are working. This procedure takes approximately one hour. There are no restrictions for this procedure.   Follow-Up: Based on echocardiogram results   Any Other Special Instructions Will Be Listed Below (If Applicable).  Echocardiogram An echocardiogram, or echocardiography, uses sound waves (ultrasound) to produce an image of your heart. The echocardiogram is simple, painless, obtained within a short period of time, and offers valuable information to your health care provider. The images from an echocardiogram can provide information such as:  Evidence of coronary artery disease (CAD).  Heart size.  Heart muscle function.  Heart valve function.  Aneurysm detection.  Evidence of a past heart attack.  Fluid buildup around the heart.  Heart muscle thickening.  Assess heart valve function.  Tell a health care provider about:  Any allergies you have.  All medicines you are taking, including vitamins, herbs, eye drops, creams, and over-the-counter medicines.  Any problems you or family members have had with anesthetic medicines.  Any blood disorders you have.  Any surgeries you have had.  Any medical conditions you have.  Whether you are pregnant or may be pregnant. What happens before the procedure? No special preparation is needed. Eat and drink normally. What happens during the procedure?  In order to produce an image of your heart, gel will be applied to your chest and a  wand-like tool (transducer) will be moved over your chest. The gel will help transmit the sound waves from the transducer. The sound waves will harmlessly bounce off your heart to allow the heart images to be captured in real-time motion. These images will then be recorded.  You may need an IV to receive a medicine that improves the quality of the pictures. What happens after the procedure? You may return to your normal schedule including diet, activities, and medicines, unless your health care provider tells you otherwise. This information is not intended to replace advice given to you by your health care provider. Make sure you discuss any questions you have with your health care provider. Document Released: 05/07/2000 Document Revised: 12/27/2015 Document Reviewed: 01/15/2013 Elsevier Interactive Patient Education  2017 [employer].    If you need a refill on your cardiac medications before your next appointment, please call your pharmacy.

## 2017-06-06 ENCOUNTER — Other Ambulatory Visit: Payer: Self-pay

## 2017-06-06 ENCOUNTER — Ambulatory Visit (HOSPITAL_COMMUNITY): Payer: Medicare Other | Attending: Cardiovascular Disease

## 2017-06-06 DIAGNOSIS — I5030 Unspecified diastolic (congestive) heart failure: Secondary | ICD-10-CM | POA: Diagnosis not present

## 2017-06-06 DIAGNOSIS — R0602 Shortness of breath: Secondary | ICD-10-CM

## 2017-06-08 DIAGNOSIS — Z1231 Encounter for screening mammogram for malignant neoplasm of breast: Secondary | ICD-10-CM | POA: Diagnosis not present

## 2017-08-01 ENCOUNTER — Ambulatory Visit
Admission: RE | Admit: 2017-08-01 | Discharge: 2017-08-01 | Disposition: A | Discharge status: Home / Self Care | Payer: Medicare Other | Source: Ambulatory Visit | Attending: Internal Medicine | Admitting: Internal Medicine

## 2017-08-01 ENCOUNTER — Other Ambulatory Visit: Payer: Self-pay | Admitting: Internal Medicine

## 2017-08-01 DIAGNOSIS — M62838 Other muscle spasm: Secondary | ICD-10-CM | POA: Diagnosis not present

## 2017-08-01 DIAGNOSIS — Z1211 Encounter for screening for malignant neoplasm of colon: Secondary | ICD-10-CM | POA: Diagnosis not present

## 2017-08-01 DIAGNOSIS — M542 Cervicalgia: Secondary | ICD-10-CM | POA: Diagnosis not present

## 2017-08-01 DIAGNOSIS — M47812 Spondylosis without myelopathy or radiculopathy, cervical region: Secondary | ICD-10-CM | POA: Diagnosis not present

## 2017-08-01 DIAGNOSIS — I10 Essential (primary) hypertension: Secondary | ICD-10-CM | POA: Diagnosis not present

## 2017-08-08 ENCOUNTER — Telehealth: Payer: Self-pay | Admitting: Interventional Cardiology

## 2017-08-08 NOTE — Telephone Encounter (Signed)
New message       [facility] Pre-operative Risk Assessment    Request for surgical clearance:  1. What type of surgery is being performed? Colonoscopy  2. When is this surgery scheduled? 09/08/2017  3. What type of clearance is required (medical clearance vs. Pharmacy clearance to hold med vs. Both)? Medical  4. Are there any medications that need to be held prior to surgery and how long? N/A  5. Practice name and name of physician performing surgery? [facility],  Dr Arta Silence  6. What is your office phone and fax number? Phone [phone], fax # [phone]   7. Anesthesia type (None, local, MAC, general) ? general   Maria Rowland 08/08/2017, 4:42 PM  _________________________________________________________________   (provider comments below)

## 2017-08-09 NOTE — Telephone Encounter (Signed)
   Patient seen by Dr. Irish Lack on 05/31/2017.  At that time, colonoscopy was deferred.  Patient was asked to avoid caffeine, and echocardiogram was checked.  Her echo had no acute abnormalities.  I have routed this to Dr. Irish Lack to see if she is now at acceptable risk from a cardiac standpoint.  Rosaria Ferries, PA-C 08/09/2017 5:40 PM [facility] [phone]

## 2017-08-10 NOTE — Telephone Encounter (Signed)
   Per Dr. Irish Lack, pt has been cleared for colonoscopy from a cardiac standpoint.

## 2017-08-10 NOTE — Telephone Encounter (Signed)
No further testing needed before colonoscopy. THanks.

## 2017-08-19 DIAGNOSIS — Z83511 Family history of glaucoma: Secondary | ICD-10-CM | POA: Diagnosis not present

## 2017-08-19 DIAGNOSIS — H26491 Other secondary cataract, right eye: Secondary | ICD-10-CM | POA: Diagnosis not present

## 2017-08-19 DIAGNOSIS — H52203 Unspecified astigmatism, bilateral: Secondary | ICD-10-CM | POA: Diagnosis not present

## 2017-08-19 DIAGNOSIS — Z961 Presence of intraocular lens: Secondary | ICD-10-CM | POA: Diagnosis not present

## 2017-08-19 DIAGNOSIS — H43813 Vitreous degeneration, bilateral: Secondary | ICD-10-CM | POA: Diagnosis not present

## 2017-08-19 DIAGNOSIS — H524 Presbyopia: Secondary | ICD-10-CM | POA: Diagnosis not present

## 2017-09-08 DIAGNOSIS — Z1211 Encounter for screening for malignant neoplasm of colon: Secondary | ICD-10-CM | POA: Diagnosis not present

## 2017-09-08 DIAGNOSIS — K648 Other hemorrhoids: Secondary | ICD-10-CM | POA: Diagnosis not present

## 2017-09-08 DIAGNOSIS — K5730 Diverticulosis of large intestine without perforation or abscess without bleeding: Secondary | ICD-10-CM | POA: Diagnosis not present

## 2017-09-13 DIAGNOSIS — J302 Other seasonal allergic rhinitis: Secondary | ICD-10-CM | POA: Diagnosis not present

## 2017-09-13 DIAGNOSIS — M62838 Other muscle spasm: Secondary | ICD-10-CM | POA: Diagnosis not present

## 2017-09-13 DIAGNOSIS — I10 Essential (primary) hypertension: Secondary | ICD-10-CM | POA: Diagnosis not present

## 2017-09-20 ENCOUNTER — Other Ambulatory Visit: Payer: Self-pay

## 2017-09-20 ENCOUNTER — Ambulatory Visit: Payer: Medicare Other | Attending: Internal Medicine | Admitting: Physical Therapy

## 2017-09-20 ENCOUNTER — Encounter: Payer: Self-pay | Admitting: Physical Therapy

## 2017-09-20 DIAGNOSIS — R293 Abnormal posture: Secondary | ICD-10-CM

## 2017-09-20 DIAGNOSIS — M6281 Muscle weakness (generalized): Secondary | ICD-10-CM | POA: Diagnosis not present

## 2017-09-20 DIAGNOSIS — M542 Cervicalgia: Secondary | ICD-10-CM | POA: Diagnosis not present

## 2017-09-20 DIAGNOSIS — M62838 Other muscle spasm: Secondary | ICD-10-CM | POA: Insufficient documentation

## 2017-09-20 NOTE — Patient Instructions (Signed)

## 2017-09-20 NOTE — Therapy (Signed)
[facility] [address]  [place], [place], [address] Phone: [phone]   Fax:  [phone]  Physical Therapy Treatment  Patient Details  Name: Maria Rowland MRN: [id number] Date of Birth: 07-26-1940 Referring Provider: Leeroy Cha, MD   Encounter Date: 09/20/2017  PT End of Session - 09/20/17 1015    Visit Number  1    Number of Visits  12    Date for PT Re-Evaluation  11/04/17    Authorization Type  Medicare    PT Start Time  6962    PT Stop Time  1119    PT Time Calculation (min)  64 min    Activity Tolerance  Patient tolerated treatment well    Behavior During Therapy  [facility] for tasks assessed/performed       Past Medical History:  Diagnosis Date  . Constipation   . HTN (hypertension)   . Irregular heart beat   . Osteoarthritis   . S/P total knee arthroplasty   . Shortness of breath    when she has palpitations    Past Surgical History:  Procedure Laterality Date  . APPENDECTOMY    . CARPAL TUNNEL RELEASE    . CHOLECYSTECTOMY    . COLONOSCOPY    . EYE SURGERY Right    cataract with lens implant  . hip replacement 2011    . surgical repair of left hand    . TONSILLECTOMY    . TOTAL KNEE ARTHROPLASTY Right 04/09/2013   Procedure: TOTAL KNEE ARTHROPLASTY;  Surgeon: Vickey Huger, MD;  Location: [place];  Service: Orthopedics;  Laterality: Right;    There were no vitals filed for this visit.  Subjective Assessment - 09/20/17 1019    Subjective  Pt reports she has been told she has OA in her neck per x-rays. Causes pain in her neck extending up into her head. Was not certain if pain was due to neck muscle pain or caffiene withdrawal or elevated BP - all being managed by MD. MD wanting her to try PT to see if it can hep with her pain. Pain worse in the morning upon rising from sleeping. Has recently bought a new mattress which seems to be helping somewhat, but is typically unable to sleep with a  pillow when on her back and only uses a relatively flat pillow when sleeping on her side.    Patient Stated Goals  "help the pain"    Currently in Pain?  Yes    Pain Score  2  avg 5-6/10; up to 9/10 at worst    Pain Location  Neck    Pain Orientation  Left    Pain Descriptors / Indicators  Aching;Headache;Tightness;Throbbing "miserable"    Pain Type  Chronic pain    Pain Radiating Towards  up into side and back to top of head    Pain Onset  More than a month ago 6-9 months - gradually worsening    Pain Frequency  Intermittent    Aggravating Factors   sleeping/laying flat, carrying a heavy purse, riding/driving in a car    Pain Relieving Factors  microwavable heat pack, sitting, hot shower    Effect of Pain on Daily Activities  avoids lifting, unable to work in the yard the way she would like, curtails activities due to pain, limits driving tolerance         [facility] PT Assessment - 09/20/17 1015      Assessment   Medical  Diagnosis  Neck pain    Referring Provider  Leeroy Cha, MD    Onset Date/Surgical Date  -- 6-9 months    Hand Dominance  Right    Next MD Visit  ~3 months    Prior Therapy  PT s/p L THA & R TKA      Balance Screen   Has the patient fallen in the past 6 months  No    Has the patient had a decrease in activity level because of a fear of falling?   Yes    Is the patient reluctant to leave their home because of a fear of falling?   No      Home Environment   Living Environment  Private residence    Living Arrangements  Children mildly mentally disabled son    Type of [place]      Prior Function   Level of Independence  Independent    Vocation  Retired    Leisure  read, working in the yard, busy with son - special needs activities      Observation/Other Assessments   Focus on Therapeutic Outcomes (FOTO)   Neck 47% (53% limitation); 58% (42% limitation)      Posture/Postural Control   Posture/Postural Control  Postural limitations    Postural  Limitations  Forward head;Rounded Shoulders    Posture Comments  elevated L shoulder      ROM / Strength   AROM / PROM / Strength  AROM;Strength      AROM   Overall AROM Comments  B shoulder ROM WFL    AROM Assessment Site  Cervical    Cervical Flexion  55    Cervical Extension  21    Cervical - Right Side Bend  24    Cervical - Left Side Bend  18    Cervical - Right Rotation  50    Cervical - Left Rotation  41      Strength   Strength Assessment Site  Shoulder    Right/Left Shoulder  Right;Left    Right Shoulder Flexion  4/5    Right Shoulder ABduction  4/5    Right Shoulder Internal Rotation  4/5    Right Shoulder External Rotation  4/5    Left Shoulder Flexion  4/5    Left Shoulder ABduction  4-/5    Left Shoulder Internal Rotation  4/5    Left Shoulder External Rotation  4/5      Palpation   Palpation comment  increased muscle tension & ttp in B UT & suboccipitals                   [facility] Adult PT Treatment/Exercise - 09/20/17 1015      Self-Care   Self-Care  Posture    Posture  Provided education in proper posture & body mechanics for typical daily activities, emphaiszing sleeping and sitting/reading posture/positioning as these seem to be triggers for her.             PT Education - 09/20/17 1119    Education provided  Yes    Education Details  PT eval findings, anticipated POC & initial postural and body mechanics education    Person(s) Educated  Patient    Methods  Explanation;Demonstration;Handout    Comprehension  Verbalized understanding;Returned demonstration;Need further instruction       PT Short Term Goals - 09/20/17 1120      PT SHORT TERM GOAL #1   Title  Pt  will verbalize/demonstrate understanding of neutral spine and shoulder posture and body mechanics to minimize neck strain    Status  New    Target Date  10/04/17      PT SHORT TERM GOAL #2   Title  Independent with initial HEP    Target Date  10/11/17        PT Long  Term Goals - 09/20/17 1120      PT LONG TERM GOAL #1   Title  Independent with ongoing/advanced HEP    Status  New    Target Date  11/04/17      PT LONG TERM GOAL #2   Title  Cervical ROM WFL w/o increased neck pain     Status  New    Target Date  11/04/17      PT LONG TERM GOAL #3   Title  Pt will report neck pain improved by 50% or greater upon rising in the morning from sleeping    Status  New    Target Date  11/04/17      PT LONG TERM GOAL #4   Title  Pt will report ability to turn head while driving w/o increased neck pain to allow her to safely check her blindspot(s)    Status  New    Target Date  11/04/17            Plan - 09/20/17 1120    Clinical Impression Statement  Maria Rowland is a 77 y/o female who presents to OP PT for neck pain of 6-9 months duration worsening recently. Pain worst upon rising from sleeping in the morning, but also triggered by prolonged positioning, driving and working in the yard.  Pt demonstrates postural abnormalities including forward head and rounded shoulders with L shoulder elevated. Abnormal muscle tension with ttp noted in B UT, LS, pecs & subocciptals. Cervical AROM decreased in all planes except flexion, but B shoulder ROM WFL. Mild deficits noted in B UE strength with pain on resisted L shoulder abduction. Pt notes limited driving tolerance and inability to work in the yard the way she would like due to neck pain, but also finds herself curtailing other activities for fear of pain. Pt will benefit from skilled PT to address deficits listed to decrease pain interference with daily tasks.    History and Personal Factors relevant to plan of care:  multijoint OA including cervical spine, s/p L THA & R THR    Clinical Presentation  Evolving    Clinical Decision Making  Low    Rehab Potential  Good    PT Frequency  2x / week    PT Duration  6 weeks    PT Treatment/Interventions  Patient/family education;Neuromuscular re-education;Therapeutic  exercise;Therapeutic activities;ADLs/Self Care Home Management;Manual techniques;Dry needling;Taping;Electrical Stimulation;Moist Heat;Cryotherapy;Traction;Iontophoresis 4mg /ml Dexamethasone;Ultrasound    Consulted and Agree with Plan of Care  Patient       Patient will benefit from skilled therapeutic intervention in order to improve the following deficits and impairments:  Pain, Decreased range of motion, Postural dysfunction, Impaired flexibility, Increased muscle spasms, Decreased strength, Impaired UE functional use, Decreased activity tolerance, Improper body mechanics  Visit Diagnosis: Cervicalgia  Abnormal posture  Other muscle spasm  Muscle weakness (generalized)     Problem List Patient Active Problem List   Diagnosis Date Noted  . S/P total knee arthroplasty   . HTN (hypertension)   . Osteoarthritis   . PVC (premature ventricular contraction) 03/13/2013  . Other specified cardiac dysrhythmias(427.89) 03/13/2013  Percival Spanish, PT, MPT 09/20/2017, 12:24 PM  [facility] [address]  [place], [place], [address] Phone: [phone]   Fax:  [phone]  Name: Maria Rowland MRN: [id number] Date of Birth: June 28, 1940

## 2017-09-23 ENCOUNTER — Ambulatory Visit: Payer: Medicare Other | Attending: Internal Medicine

## 2017-09-23 DIAGNOSIS — M6281 Muscle weakness (generalized): Secondary | ICD-10-CM | POA: Diagnosis not present

## 2017-09-23 DIAGNOSIS — R293 Abnormal posture: Secondary | ICD-10-CM | POA: Diagnosis not present

## 2017-09-23 DIAGNOSIS — M62838 Other muscle spasm: Secondary | ICD-10-CM | POA: Diagnosis not present

## 2017-09-23 DIAGNOSIS — M542 Cervicalgia: Secondary | ICD-10-CM | POA: Diagnosis not present

## 2017-09-23 NOTE — Therapy (Signed)
[facility] [address]  [place], [place], [address] Phone: [phone]   Fax:  [phone]  Physical Therapy Treatment  Patient Details  Name: Maria Rowland MRN: [id number] Date of Birth: 1940/10/18 Referring Provider: Leeroy Cha, MD   Encounter Date: 09/23/2017  PT End of Session - 09/23/17 1018    Visit Number  2    Number of Visits  12    Date for PT Re-Evaluation  11/04/17    Authorization Type  Medicare    PT Start Time  1013    PT Stop Time  1110    PT Time Calculation (min)  57 min    Activity Tolerance  Patient tolerated treatment well    Behavior During Therapy  [facility] for tasks assessed/performed       Past Medical History:  Diagnosis Date  . Constipation   . HTN (hypertension)   . Irregular heart beat   . Osteoarthritis   . S/P total knee arthroplasty   . Shortness of breath    when she has palpitations    Past Surgical History:  Procedure Laterality Date  . APPENDECTOMY    . CARPAL TUNNEL RELEASE    . CHOLECYSTECTOMY    . COLONOSCOPY    . EYE SURGERY Right    cataract with lens implant  . hip replacement 2011    . surgical repair of left hand    . TONSILLECTOMY    . TOTAL KNEE ARTHROPLASTY Right 04/09/2013   Procedure: TOTAL KNEE ARTHROPLASTY;  Surgeon: Vickey Huger, MD;  Location: [facility] [place];  Service: Orthopedics;  Laterality: Right;    There were no vitals filed for this visit.  Subjective Assessment - 09/23/17 1013    Subjective  Pt. noting she has been trying to be aware of adjusting her posture to reduce neck strain.      Patient Stated Goals  "help the pain"    Currently in Pain?  No/denies    Pain Score  0-No pain 4/10 neck pain in mornings     Pain Location  Neck    Pain Orientation  Left    Pain Descriptors / Indicators  Aching    Pain Type  Chronic pain    Pain Onset  More than a month ago    Pain Frequency  Intermittent    Aggravating Factors   doing "heavy" yard  work     Pain Relieving Factors  heat     Multiple Pain Sites  No                       [facility] Adult PT Treatment/Exercise - 09/23/17 1051      Self-Care   Self-Care  Posture;Other Self-Care Comments    Posture  Discussed need for proper posture with sitting/reading and strategies to reduce cervical strain as pt. reads a lot       Neck Exercises: Machines for Strengthening   UBE (Upper Arm Bike)  Lvl: 1.0, 3 min forwards/3 backwards       Neck Exercises: Seated   Neck Retraction  10 reps;3 secs    Neck Retraction Limitations  Cues for proper motion     Money  10 reps;3 secs    Money Limitations  yellow TB; + scap. retraction     Other Seated Exercise  Scapular retraction/depression 3" x 10 reps       Moist Heat Therapy   Number Minutes Moist Heat  10 Minutes    Moist Heat Location  Cervical + upper back       Electrical Stimulation   Electrical Stimulation Location  B UT    Electrical Stimulation Action  IFC    Electrical Stimulation Parameters  80-150Hz , intensity to pt. tolerance, 10'      Manual Therapy   Manual Therapy  Soft tissue mobilization;Passive ROM;Myofascial release    Manual therapy comments  Hooklying    Soft tissue mobilization  STM to B UT, LS, cervical paraspinals, suboccipitals,     Myofascial Release  TPR to B UT, suboccipital release     Passive ROM  Manual UT, LS stretch with gentle cerv traction and overpressure into shoulder                PT Short Term Goals - 09/23/17 1021      PT SHORT TERM GOAL #1   Title  Pt will verbalize/demonstrate understanding of neutral spine and shoulder posture and body mechanics to minimize neck strain    Status  On-going      PT SHORT TERM GOAL #2   Title  Independent with initial HEP    Status  On-going        PT Long Term Goals - 09/23/17 1021      PT LONG TERM GOAL #1   Title  Independent with ongoing/advanced HEP    Status  On-going      PT LONG TERM GOAL #2   Title  Cervical  ROM WFL w/o increased neck pain     Status  On-going      PT LONG TERM GOAL #3   Title  Pt will report neck pain improved by 50% or greater upon rising in the morning from sleeping    Status  On-going      PT LONG TERM GOAL #4   Title  Pt will report ability to turn head while driving w/o increased neck pain to allow her to safely check her blindspot(s)    Status  On-going            Plan - 09/23/17 1021    Clinical Impression Statement  Maria Rowland noting she has been trying to be more aware of proper posture with daily tasks such as reading.  Reviewed strategies to reduce cervical strain with reading as pt. noted pain with this task at home.  Pt. with noted tension/tenderness in B UT, cervical paraspinals, and suboccipitals today, which responded well to manual therapy.  Initiated gentle postural strengthening activities and ended treatment with E-stim/moist heat to promote reduction in tone and pain.  Will monitor response and progress per pt. in coming visit.    PT Treatment/Interventions  Patient/family education;Neuromuscular re-education;Therapeutic exercise;Therapeutic activities;ADLs/Self Care Home Management;Manual techniques;Dry needling;Taping;Electrical Stimulation;Moist Heat;Cryotherapy;Traction;Iontophoresis 4mg /ml Dexamethasone;Ultrasound    Consulted and Agree with Plan of Care  Patient       Patient will benefit from skilled therapeutic intervention in order to improve the following deficits and impairments:  Pain, Decreased range of motion, Postural dysfunction, Impaired flexibility, Increased muscle spasms, Decreased strength, Impaired UE functional use, Decreased activity tolerance, Improper body mechanics  Visit Diagnosis: Cervicalgia  Abnormal posture  Other muscle spasm  Muscle weakness (generalized)     Problem List Patient Active Problem List   Diagnosis Date Noted  . S/P total knee arthroplasty   . HTN (hypertension)   . Osteoarthritis   . PVC  (premature ventricular contraction) 03/13/2013  . Other specified cardiac dysrhythmias(427.89) 03/13/2013  Maria Rowland, PTA 09/23/17 12:14 PM  [facility] [address]  [place], [place], [address] Phone: [phone]   Fax:  [phone]  Name: Maria Rowland MRN: [id number] Date of Birth: July 08, 1940

## 2017-09-27 ENCOUNTER — Ambulatory Visit: Payer: Medicare Other

## 2017-09-27 DIAGNOSIS — R293 Abnormal posture: Secondary | ICD-10-CM

## 2017-09-27 DIAGNOSIS — M62838 Other muscle spasm: Secondary | ICD-10-CM | POA: Diagnosis not present

## 2017-09-27 DIAGNOSIS — M6281 Muscle weakness (generalized): Secondary | ICD-10-CM

## 2017-09-27 DIAGNOSIS — M542 Cervicalgia: Secondary | ICD-10-CM | POA: Diagnosis not present

## 2017-09-27 NOTE — Therapy (Signed)
[facility] [address]  [place], [place], [address] Phone: [phone]   Fax:  [phone]  Physical Therapy Treatment  Patient Details  Name: Maria Rowland MRN: [id number] Date of Birth: 08-May-1941 Referring Provider: Leeroy Cha, MD   Encounter Date: 09/27/2017  PT End of Session - 09/27/17 0845    Visit Number  3    Number of Visits  12    Date for PT Re-Evaluation  11/04/17    Authorization Type  Medicare    PT Start Time  0842    PT Stop Time  0945    PT Time Calculation (min)  63 min    Activity Tolerance  Patient tolerated treatment well    Behavior During Therapy  [facility] for tasks assessed/performed       Past Medical History:  Diagnosis Date  . Constipation   . HTN (hypertension)   . Irregular heart beat   . Osteoarthritis   . S/P total knee arthroplasty   . Shortness of breath    when she has palpitations    Past Surgical History:  Procedure Laterality Date  . APPENDECTOMY    . CARPAL TUNNEL RELEASE    . CHOLECYSTECTOMY    . COLONOSCOPY    . EYE SURGERY Right    cataract with lens implant  . hip replacement 2011    . surgical repair of left hand    . TONSILLECTOMY    . TOTAL KNEE ARTHROPLASTY Right 04/09/2013   Procedure: TOTAL KNEE ARTHROPLASTY;  Surgeon: Vickey Huger, MD;  Location: [facility];  Service: Orthopedics;  Laterality: Right;    There were no vitals filed for this visit.  Subjective Assessment - 09/27/17 0845    Subjective  Pt. reporting improvement in neck motion backing out of driveway and improved comfort for remainder of day following last visit.      Patient Stated Goals  "help the pain"    Currently in Pain?  Yes    Pain Score  2     Pain Location  Head    Pain Orientation  Left;Other (Comment) [place] "top of head"    Pain Descriptors / Indicators  Aching    Pain Type  Chronic pain    Pain Frequency  Intermittent    Aggravating Factors   Picking up heavy  objects    Pain Relieving Factors  Heat    Multiple Pain Sites  No                       [facility] Adult PT Treatment/Exercise - 09/27/17 0907      Neck Exercises: Machines for Strengthening   UBE (Upper Arm Bike)  Lvl: 1.5, 3 min forwards/3 backwards       Neck Exercises: Seated   Neck Retraction  10 reps;3 secs    Neck Retraction Limitations  + scap. retraction     Cervical Rotation  10 reps;Right;Left    Lateral Flexion  10 reps;Right;Left    Shoulder Shrugs  --    Shoulder Rolls  10 reps;Backwards;Forwards Cues required for full motion       Moist Heat Therapy   Number Minutes Moist Heat  15 Minutes    Moist Heat Location  -- B UT      Electrical Stimulation   Electrical Stimulation Location  B UT    Electrical Stimulation Action  IFC    Electrical Stimulation Parameters  80-150Hz , intensity to pt.  tolerance, 15'    Electrical Stimulation Goals  Tone      Manual Therapy   Manual Therapy  Soft tissue mobilization;Passive ROM;Myofascial release    Manual therapy comments  Hooklying    Soft tissue mobilization  STM to B UT, LS, cervical paraspinals, suboccipitals; ttp in B UT      Myofascial Release  TPR to R UT in area of tenderness     Passive ROM  Manual UT, LS stretch with gentle cerv traction and overpressure into shoulder depression       Neck Exercises: Stretches   Upper Trapezius Stretch  Right;Left;1 rep;30 seconds    Upper Trapezius Stretch Limitations  B     Levator Stretch  Right;Left;1 rep;30 seconds    Levator Stretch Limitations  B              PT Education - 09/27/17 1129    Education provided  Yes    Education Details  Initial HEP     Person(s) Educated  Patient    Methods  Explanation;Demonstration;Verbal cues;Handout    Comprehension  Verbalized understanding;Returned demonstration;Verbal cues required;Need further instruction       PT Short Term Goals - 09/23/17 1021      PT SHORT TERM GOAL #1   Title  Pt will  verbalize/demonstrate understanding of neutral spine and shoulder posture and body mechanics to minimize neck strain    Status  On-going      PT SHORT TERM GOAL #2   Title  Independent with initial HEP    Status  On-going        PT Long Term Goals - 09/23/17 1021      PT LONG TERM GOAL #1   Title  Independent with ongoing/advanced HEP    Status  On-going      PT LONG TERM GOAL #2   Title  Cervical ROM WFL w/o increased neck pain     Status  On-going      PT LONG TERM GOAL #3   Title  Pt will report neck pain improved by 50% or greater upon rising in the morning from sleeping    Status  On-going      PT LONG TERM GOAL #4   Title  Pt will report ability to turn head while driving w/o increased neck pain to allow her to safely check her blindspot(s)    Status  On-going            Plan - 09/27/17 0845    Clinical Impression Statement  Pt. reporting good relief from neck pain following last visit.  Tolerated additional cervical ROM/strengthening activities in treatment well today however did require cueing throughout therex for proper technique.  Maria Rowland still with TTP in R UT today with indications for TP's which were addressed with STM/TPR with good response.  Improved tension and noted less tenderness in cervical paraspinals, suboccipital musculature today.   Pt. noting some improvement in cervical motion following manual therapy today.  Ended treatment with E-stim/moist heat to upper shoulder musculature to promote reduction in post-exercise tone and pain.  HEP issued to pt. and will plan to monitor response at upcoming visit.      PT Treatment/Interventions  Patient/family education;Neuromuscular re-education;Therapeutic exercise;Therapeutic activities;ADLs/Self Care Home Management;Manual techniques;Dry needling;Taping;Electrical Stimulation;Moist Heat;Cryotherapy;Traction;Iontophoresis 4mg /ml Dexamethasone;Ultrasound       Patient will benefit from skilled therapeutic  intervention in order to improve the following deficits and impairments:  Pain, Decreased range of motion, Postural dysfunction, Impaired flexibility, Increased  muscle spasms, Decreased strength, Impaired UE functional use, Decreased activity tolerance, Improper body mechanics  Visit Diagnosis: Cervicalgia  Abnormal posture  Other muscle spasm  Muscle weakness (generalized)     Problem List Patient Active Problem List   Diagnosis Date Noted  . S/P total knee arthroplasty   . HTN (hypertension)   . Osteoarthritis   . PVC (premature ventricular contraction) 03/13/2013  . Other specified cardiac dysrhythmias(427.89) 03/13/2013    Bess Harvest, PTA 09/27/17 11:37 AM   [facility] [address]  [place], [place], [address] Phone: [phone]   Fax:  [phone]  Name: Maria Rowland MRN: [id number] Date of Birth: Apr 16, 1941

## 2017-09-30 ENCOUNTER — Ambulatory Visit: Payer: Medicare Other | Admitting: Physical Therapy

## 2017-09-30 ENCOUNTER — Encounter: Payer: Self-pay | Admitting: Physical Therapy

## 2017-09-30 DIAGNOSIS — R293 Abnormal posture: Secondary | ICD-10-CM

## 2017-09-30 DIAGNOSIS — M6281 Muscle weakness (generalized): Secondary | ICD-10-CM | POA: Diagnosis not present

## 2017-09-30 DIAGNOSIS — M542 Cervicalgia: Secondary | ICD-10-CM

## 2017-09-30 DIAGNOSIS — M62838 Other muscle spasm: Secondary | ICD-10-CM | POA: Diagnosis not present

## 2017-09-30 NOTE — Therapy (Addendum)
[facility] [address]  [place], [place], [address] Phone: [phone]   Fax:  [phone]  Physical Therapy Treatment  Patient Details  Name: Maria Rowland MRN: [id number] Date of Birth: February 27, 1941 Referring Provider: Leeroy Cha, MD   Encounter Date: 09/30/2017  PT End of Session - 09/30/17 0839    Visit Number  4    Number of Visits  12    Date for PT Re-Evaluation  11/04/17    Authorization Type  Medicare    PT Start Time  0839    PT Stop Time  0946    PT Time Calculation (min)  67 min    Activity Tolerance  Patient tolerated treatment well    Behavior During Therapy  [facility] for tasks assessed/performed       Past Medical History:  Diagnosis Date  . Constipation   . HTN (hypertension)   . Irregular heart beat   . Osteoarthritis   . S/P total knee arthroplasty   . Shortness of breath    when she has palpitations    Past Surgical History:  Procedure Laterality Date  . APPENDECTOMY    . CARPAL TUNNEL RELEASE    . CHOLECYSTECTOMY    . COLONOSCOPY    . EYE SURGERY Right    cataract with lens implant  . hip replacement 2011    . surgical repair of left hand    . TONSILLECTOMY    . TOTAL KNEE ARTHROPLASTY Right 04/09/2013   Procedure: TOTAL KNEE ARTHROPLASTY;  Surgeon: Vickey Huger, MD;  Location: [facility];  Service: Orthopedics;  Laterality: Right;    There were no vitals filed for this visit.  Subjective Assessment - 09/30/17 0842    Subjective  Pt reporting improving pain and less stiffness in neck since starting PT.    Patient Stated Goals  "help the pain"    Currently in Pain?  Yes    Pain Score  1                        [facility] Adult PT Treatment/Exercise - 09/30/17 0839      Exercises   Exercises  Neck      Neck Exercises: Machines for Strengthening   UBE (Upper Arm Bike)  Lvl 2.0 - 3' fwd/3' back      Neck Exercises: Theraband   Shoulder Extension  10 reps  yellow TB    Shoulder Extension Limitations  standing - cues to avoid shoulder hike (esp on L)    Rows  10 reps yellow TB    Rows Limitations  standing    Shoulder External Rotation  10 reps yellow TB    Shoulder External Rotation Limitations  hooklying on pool noodle    Horizontal ABduction  10 reps yellow TB    Horizontal ABduction Limitations  hooklying on pool noodle      Neck Exercises: Seated   Neck Retraction  10 reps;3 secs    Neck Retraction Limitations  cues to avoid neck flexion    Cervical Rotation  Both;10 reps    Shoulder Rolls  Backwards;10 reps    Other Seated Exercise  Scapular retraction/depression 10 x 3"      Modalities   Modalities  Electrical Stimulation;Moist Heat      Moist Heat Therapy   Number Minutes Moist Heat  15 Minutes    Moist Heat Location  Cervical      Electrical Stimulation  Electrical Stimulation Location  B UT/cervical paraspinals    Electrical Stimulation Action  IFC    Electrical Stimulation Parameters  80-150Hz , intensity to pt. tol x15'    Electrical Stimulation Goals  Pain;Tone      Manual Therapy   Manual Therapy  Soft tissue mobilization;Myofascial release;Passive ROM;Manual Traction;Joint mobilization    Manual therapy comments  Hooklying    Joint Mobilization  L 1st & 2nd rib inf mobs    Soft tissue mobilization  STM to B UT, LS, cervical paraspinals, suboccipitals    Myofascial Release  manual suboccipital release & manual TPR to L UT    Passive ROM  manual L UT & LS stretches    Manual Traction  gentle cervical distraction 4 x 30"      Neck Exercises: Stretches   Upper Trapezius Stretch  Right;Left;30 seconds;1 rep    Upper Trapezius Stretch Limitations  hand anchored on edge of seat to avoid shoulder hike    Levator Stretch  Right;Left;30 seconds    Levator Stretch Limitations  hand behind back to avoid rounded shoulder    Other Neck Stretches  Hooklying pec stretch over pool noodle - varying arm position x2'                PT Short Term Goals - 09/30/17 0843      PT SHORT TERM GOAL #1   Title  Pt will verbalize/demonstrate understanding of neutral spine and shoulder posture and body mechanics to minimize neck strain    Status  Achieved      PT SHORT TERM GOAL #2   Title  Independent with initial HEP    Status  Achieved        PT Long Term Goals - 09/23/17 1021      PT LONG TERM GOAL #1   Title  Independent with ongoing/advanced HEP    Status  On-going      PT LONG TERM GOAL #2   Title  Cervical ROM WFL w/o increased neck pain     Status  On-going      PT LONG TERM GOAL #3   Title  Pt will report neck pain improved by 50% or greater upon rising in the morning from sleeping    Status  On-going      PT LONG TERM GOAL #4   Title  Pt will report ability to turn head while driving w/o increased neck pain to allow her to safely check her blindspot(s)    Status  On-going            Plan - 09/30/17 0844    Clinical Impression Statement  Hoyle Sauer reportin improvement in neck pain and stiffness since start of PT. Notes improving awareness of posture but still finds herself having to actively correct posture with tasks such as loading dishwasher or when reading her books. Initial HEP reviewed with clarifications provided in response to pt questions and cueing necessary to avoid neck flexion with chin tuck, but pt able to demonstrate all exercises appropriately after review. Manual therapy addressing abnormal muscle tension and limited flexibility while promoting neutral alignment with pt noting good relief. Progressed therapeutic exercises to include basic scapular stabilization exercises to promote maintenance of good posture with good tolerance reported. Treatment concluded with estim and moist heat to promote further muscle relaxation - patient would benefit from home estim device due to atrophy in scapular and shoulder musculature as well as for management of ongoing pain/abnormal  muscle tension.  Rehab Potential  Good    PT Treatment/Interventions  Patient/family education;Neuromuscular re-education;Therapeutic exercise;Therapeutic activities;ADLs/Self Care Home Management;Manual techniques;Dry needling;Taping;Electrical Stimulation;Moist Heat;Cryotherapy;Traction;Iontophoresis 4mg /ml Dexamethasone;Ultrasound       Patient will benefit from skilled therapeutic intervention in order to improve the following deficits and impairments:  Pain, Decreased range of motion, Postural dysfunction, Impaired flexibility, Increased muscle spasms, Decreased strength, Impaired UE functional use, Decreased activity tolerance, Improper body mechanics  Visit Diagnosis: Cervicalgia  Abnormal posture  Other muscle spasm  Muscle weakness (generalized)     Problem List Patient Active Problem List   Diagnosis Date Noted  . S/P total knee arthroplasty   . HTN (hypertension)   . Osteoarthritis   . PVC (premature ventricular contraction) 03/13/2013  . Other specified cardiac dysrhythmias(427.89) 03/13/2013    Percival Spanish, PT, MPT 09/30/2017, 10:12 AM  [facility] [address]  [place], [place], [address] Phone: [phone]   Fax:  [phone]  Name: Maria Rowland MRN: [id number] Date of Birth: [birth year]

## 2017-10-04 ENCOUNTER — Ambulatory Visit: Payer: Medicare Other

## 2017-10-04 DIAGNOSIS — R293 Abnormal posture: Secondary | ICD-10-CM

## 2017-10-04 DIAGNOSIS — M542 Cervicalgia: Secondary | ICD-10-CM

## 2017-10-04 DIAGNOSIS — M62838 Other muscle spasm: Secondary | ICD-10-CM

## 2017-10-04 DIAGNOSIS — M6281 Muscle weakness (generalized): Secondary | ICD-10-CM | POA: Diagnosis not present

## 2017-10-04 NOTE — Therapy (Signed)
[facility] [address]  [place], [place], [address] Phone: [phone]   Fax:  [phone]  Physical Therapy Treatment  Patient Details  Name: Maria Rowland MRN: [id number] Date of Birth: 03-27-1941 Referring Provider: Leeroy Cha, MD   Encounter Date: 10/04/2017  PT End of Session - 10/04/17 1023    Visit Number  5    Number of Visits  12    Date for PT Re-Evaluation  11/04/17    Authorization Type  Medicare    PT Start Time  1018    PT Stop Time  1111    PT Time Calculation (min)  53 min    Activity Tolerance  Patient tolerated treatment well    Behavior During Therapy  [facility] for tasks assessed/performed       Past Medical History:  Diagnosis Date  . Constipation   . HTN (hypertension)   . Irregular heart beat   . Osteoarthritis   . S/P total knee arthroplasty   . Shortness of breath    when she has palpitations    Past Surgical History:  Procedure Laterality Date  . APPENDECTOMY    . CARPAL TUNNEL RELEASE    . CHOLECYSTECTOMY    . COLONOSCOPY    . EYE SURGERY Right    cataract with lens implant  . hip replacement 2011    . surgical repair of left hand    . TONSILLECTOMY    . TOTAL KNEE ARTHROPLASTY Right 04/09/2013   Procedure: TOTAL KNEE ARTHROPLASTY;  Surgeon: Vickey Huger, MD;  Location: [facility];  Service: Orthopedics;  Laterality: Right;    There were no vitals filed for this visit.  Subjective Assessment - 10/04/17 1021    Subjective  Pt. reporting "fogginess" with headache yesterday however pain free today.      Patient Stated Goals  "help the pain"    Currently in Pain?  No/denies    Pain Score  0-No pain    Multiple Pain Sites  No                       [facility] Adult PT Treatment/Exercise - 10/04/17 1044      Neck Exercises: Machines for Strengthening   UBE (Upper Arm Bike)  Lvl 2.0 - 3' fwd/3' back    Cybex Row  10# - low handles  2 sets; + chin tuck        Neck Exercises: Theraband   Shoulder Extension  15 reps;Other (comment) yellow TB: + chin tuck     Shoulder Extension Limitations  standing - cues to avoid shoulder hike (esp on L)    Rows  10 reps;Red Focusing on improving scapular retraction     Rows Limitations  standing    Other Theraband Exercises  --      Moist Heat Therapy   Number Minutes Moist Heat  10 Minutes    Moist Heat Location  Cervical      Electrical Stimulation   Electrical Stimulation Location  B UT/cervical paraspinals    Electrical Stimulation Action  IFC    Electrical Stimulation Parameters  80-150Hz , intensity to pt. tolerance, 10'    Electrical Stimulation Goals  Pain;Tone      Manual Therapy   Manual Therapy  Soft tissue mobilization;Myofascial release;Passive ROM    Manual therapy comments  Hooklying    Soft tissue mobilization  STM to B UT, LS, cervical paraspinals, suboccipitals    Myofascial  Release  manual TPR to L UT    Passive ROM  manual L UT & LS stretches    Manual Traction  gentle cervical distraction 4 x 30"      Neck Exercises: Stretches   Upper Trapezius Stretch  Right;Left;30 seconds;1 rep    Upper Trapezius Stretch Limitations  hand anchored on edge of seat to avoid shoulder hike    Levator Stretch  Right;Left;30 seconds    Levator Stretch Limitations  B             PT Education - 10/04/17 1110    Education provided  Yes    Education Details  HEP update - yellow TB issued to pt.     Person(s) Educated  Patient    Methods  Explanation;Demonstration;Verbal cues;Handout    Comprehension  Verbalized understanding;Returned demonstration;Verbal cues required;Need further instruction       PT Short Term Goals - 09/30/17 0843      PT SHORT TERM GOAL #1   Title  Pt will verbalize/demonstrate understanding of neutral spine and shoulder posture and body mechanics to minimize neck strain    Status  Achieved      PT SHORT TERM GOAL #2   Title  Independent with initial HEP     Status  Achieved        PT Long Term Goals - 09/23/17 1021      PT LONG TERM GOAL #1   Title  Independent with ongoing/advanced HEP    Status  On-going      PT LONG TERM GOAL #2   Title  Cervical ROM WFL w/o increased neck pain     Status  On-going      PT LONG TERM GOAL #3   Title  Pt will report neck pain improved by 50% or greater upon rising in the morning from sleeping    Status  On-going      PT LONG TERM GOAL #4   Title  Pt will report ability to turn head while driving w/o increased neck pain to allow her to safely check her blindspot(s)    Status  On-going            Plan - 10/04/17 1110    Clinical Impression Statement  Pt. reporting good improvement in pain levels since starting therapy.  Tolerated mild advancement of scapular strengthening activities today well and HEP updated accordingly with yellow TB issued to pt.  Pt. with tenderness in upper shoulder and suboccipital musculature today, which was addressed with STM/TPR with good response.  Ended visit with E-stim/moist heat to upper shoulder/cervical musculature for further reduction in tone.  Progressing well toward goals.      PT Treatment/Interventions  Patient/family education;Neuromuscular re-education;Therapeutic exercise;Therapeutic activities;ADLs/Self Care Home Management;Manual techniques;Dry needling;Taping;Electrical Stimulation;Moist Heat;Cryotherapy;Traction;Iontophoresis 4mg /ml Dexamethasone;Ultrasound    Consulted and Agree with Plan of Care  Patient       Patient will benefit from skilled therapeutic intervention in order to improve the following deficits and impairments:  Pain, Decreased range of motion, Postural dysfunction, Impaired flexibility, Increased muscle spasms, Decreased strength, Impaired UE functional use, Decreased activity tolerance, Improper body mechanics  Visit Diagnosis: Cervicalgia  Abnormal posture  Other muscle spasm  Muscle weakness (generalized)     Problem  List Patient Active Problem List   Diagnosis Date Noted  . S/P total knee arthroplasty   . HTN (hypertension)   . Osteoarthritis   . PVC (premature ventricular contraction) 03/13/2013  . Other specified cardiac dysrhythmias(427.89) 03/13/2013  Bess Harvest, PTA 10/04/17 11:32 AM  [facility] [address]  [place], [place], [address] Phone: [phone]   Fax:  [phone]  Name: Itali Mckendry MRN: [id number] Date of Birth: [birth year]

## 2017-10-07 ENCOUNTER — Ambulatory Visit: Payer: Medicare Other

## 2017-10-07 DIAGNOSIS — R293 Abnormal posture: Secondary | ICD-10-CM

## 2017-10-07 DIAGNOSIS — M6281 Muscle weakness (generalized): Secondary | ICD-10-CM

## 2017-10-07 DIAGNOSIS — M542 Cervicalgia: Secondary | ICD-10-CM

## 2017-10-07 DIAGNOSIS — M62838 Other muscle spasm: Secondary | ICD-10-CM

## 2017-10-07 NOTE — Therapy (Signed)
[facility] [address]  [place], [place], [address] Phone: [phone]   Fax:  [phone]  Physical Therapy Treatment  Patient Details  Name: Maria Rowland MRN: [id number] Date of Birth: Mar 28, 1941 Referring Provider: Leeroy Cha, MD   Encounter Date: 10/07/2017  PT End of Session - 10/07/17 1000    Visit Number  6    Number of Visits  12    Date for PT Re-Evaluation  11/04/17    Authorization Type  Medicare    PT Start Time  0931    PT Stop Time  1030    PT Time Calculation (min)  59 min    Activity Tolerance  Patient tolerated treatment well    Behavior During Therapy  [facility] for tasks assessed/performed       Past Medical History:  Diagnosis Date  . Constipation   . HTN (hypertension)   . Irregular heart beat   . Osteoarthritis   . S/P total knee arthroplasty   . Shortness of breath    when she has palpitations    Past Surgical History:  Procedure Laterality Date  . APPENDECTOMY    . CARPAL TUNNEL RELEASE    . CHOLECYSTECTOMY    . COLONOSCOPY    . EYE SURGERY Right    cataract with lens implant  . hip replacement 2011    . surgical repair of left hand    . TONSILLECTOMY    . TOTAL KNEE ARTHROPLASTY Right 04/09/2013   Procedure: TOTAL KNEE ARTHROPLASTY;  Surgeon: Vickey Huger, MD;  Location: [facility];  Service: Orthopedics;  Laterality: Right;    There were no vitals filed for this visit.  Subjective Assessment - 10/07/17 0936    Subjective  Pt. reporting she has not had head ache since being in therapy last visit.      Patient Stated Goals  "help the pain"    Currently in Pain?  Yes    Pain Score  3     Pain Location  Neck    Pain Orientation  Right    Pain Descriptors / Indicators  -- "stretch"    Pain Type  Chronic pain    Pain Radiating Towards  extending into R UT    Pain Onset  More than a month ago    Pain Frequency  Intermittent    Aggravating Factors   turning head to R      Pain Relieving Factors  Heat    Multiple Pain Sites  No                       [facility] Adult PT Treatment/Exercise - 10/07/17 0953      Neck Exercises: Machines for Strengthening   UBE (Upper Arm Bike)  Lvl 2.0 - 3' fwd/3' back      Neck Exercises: Standing   Other Standing Exercises  B shoulder flexion, scaption 1# x 10 reps each way     Other Standing Exercises  B cervical rotation leaning on pool noodle on wall x 10 reps each       Neck Exercises: Seated   Shoulder Rolls  Backwards;15 reps Focusing on full scap. retraction     Other Seated Exercise  Scapular retraction/depression 15 x 3"      Moist Heat Therapy   Number Minutes Moist Heat  15 Minutes    Moist Heat Location  Cervical      Electrical Stimulation  Electrical Stimulation Location  B UT/cervical paraspinals    Electrical Stimulation Action  IFC    Electrical Stimulation Parameters  80-150HZ , intensity to pt. tolerance,15'     Electrical Stimulation Goals  Pain;Tone      Manual Therapy   Manual Therapy  Soft tissue mobilization;Myofascial release;Passive ROM    Manual therapy comments  Hooklying    Soft tissue mobilization  STM to B UT, LS, cervical paraspinals, suboccipitals; pt. ttp in R UT with palpable TP    Myofascial Release  Manual TPR to R UT    Passive ROM  Manual B UT & LS stretches               PT Short Term Goals - 09/30/17 0843      PT SHORT TERM GOAL #1   Title  Pt will verbalize/demonstrate understanding of neutral spine and shoulder posture and body mechanics to minimize neck strain    Status  Achieved      PT SHORT TERM GOAL #2   Title  Independent with initial HEP    Status  Achieved        PT Long Term Goals - 09/23/17 1021      PT LONG TERM GOAL #1   Title  Independent with ongoing/advanced HEP    Status  On-going      PT LONG TERM GOAL #2   Title  Cervical ROM WFL w/o increased neck pain     Status  On-going      PT LONG TERM GOAL #3   Title  Pt  will report neck pain improved by 50% or greater upon rising in the morning from sleeping    Status  On-going      PT LONG TERM GOAL #4   Title  Pt will report ability to turn head while driving w/o increased neck pain to allow her to safely check her blindspot(s)    Status  On-going            Plan - 10/07/17 1004    Clinical Impression Statement  Maria Rowland reporting no headache since last visit.  Feels overall pain levels are improving.  Did have complaint of R UT pain with "crick in my neck" which responded well to manual therapy and E-stim/moist heat with good improvement in comfort following this.  Pt. tolerated addition of prone chin tuck well today and seems to be responding well overall to scapular strengthening with improving scapular motion.      PT Treatment/Interventions  Patient/family education;Neuromuscular re-education;Therapeutic exercise;Therapeutic activities;ADLs/Self Care Home Management;Manual techniques;Dry needling;Taping;Electrical Stimulation;Moist Heat;Cryotherapy;Traction;Iontophoresis 4mg /ml Dexamethasone;Ultrasound    Consulted and Agree with Plan of Care  Patient       Patient will benefit from skilled therapeutic intervention in order to improve the following deficits and impairments:  Pain, Decreased range of motion, Postural dysfunction, Impaired flexibility, Increased muscle spasms, Decreased strength, Impaired UE functional use, Decreased activity tolerance, Improper body mechanics  Visit Diagnosis: Cervicalgia  Abnormal posture  Other muscle spasm  Muscle weakness (generalized)     Problem List Patient Active Problem List   Diagnosis Date Noted  . S/P total knee arthroplasty   . HTN (hypertension)   . Osteoarthritis   . PVC (premature ventricular contraction) 03/13/2013  . Other specified cardiac dysrhythmias(427.89) 03/13/2013    Bess Harvest, PTA 10/07/17 11:56 AM  [facility] [address]  [place], [place], [address] Phone: [phone]   Fax:  [phone]  Name: Maria Rowland MRN: [id number] Date of Birth: 1940/11/11

## 2017-10-11 ENCOUNTER — Encounter: Payer: Self-pay | Admitting: Physical Therapy

## 2017-10-11 ENCOUNTER — Ambulatory Visit: Payer: Medicare Other | Admitting: Physical Therapy

## 2017-10-11 DIAGNOSIS — M62838 Other muscle spasm: Secondary | ICD-10-CM | POA: Diagnosis not present

## 2017-10-11 DIAGNOSIS — M542 Cervicalgia: Secondary | ICD-10-CM

## 2017-10-11 DIAGNOSIS — M6281 Muscle weakness (generalized): Secondary | ICD-10-CM | POA: Diagnosis not present

## 2017-10-11 DIAGNOSIS — R293 Abnormal posture: Secondary | ICD-10-CM | POA: Diagnosis not present

## 2017-10-11 NOTE — Therapy (Signed)
[facility] [address]  [place] Row[place], [place], [address] Phone: [phone]   Fax:  [phone]  Physical Therapy Treatment  Patient Details  Name: Maria Rowland MRN: [id number] Date of Birth: April 07, 1941 Referring Provider: Leeroy Cha, MD   Encounter Date: 10/11/2017  PT End of Session - 10/11/17 1015    Visit Number  7    Number of Visits  12    Date for PT Re-Evaluation  11/04/17    Authorization Type  Medicare    PT Start Time  1324    PT Stop Time  1102    PT Time Calculation (min)  47 min    Activity Tolerance  Patient tolerated treatment well    Behavior During Therapy  [facility] for tasks assessed/performed       Past Medical History:  Diagnosis Date  . Constipation   . HTN (hypertension)   . Irregular heart beat   . Osteoarthritis   . S/P total knee arthroplasty   . Shortness of breath    when she has palpitations    Past Surgical History:  Procedure Laterality Date  . APPENDECTOMY    . CARPAL TUNNEL RELEASE    . CHOLECYSTECTOMY    . COLONOSCOPY    . EYE SURGERY Right    cataract with lens implant  . hip replacement 2011    . surgical repair of left hand    . TONSILLECTOMY    . TOTAL KNEE ARTHROPLASTY Right 04/09/2013   Procedure: TOTAL KNEE ARTHROPLASTY;  Surgeon: Vickey Huger, MD;  Location: [place];  Service: Orthopedics;  Laterality: Right;    There were no vitals filed for this visit.  Subjective Assessment - 10/11/17 1017    Subjective  Pt reporting R sided pain pretty much the same today.    Patient Stated Goals  "help the pain"    Currently in Pain?  Yes    Pain Score  3     Pain Location  Neck & upper posterior shoulder    Pain Orientation  Right    Pain Descriptors / Indicators  -- "crick in my neck"    Pain Type  Chronic pain    Pain Frequency  Constant                       [facility] Adult PT Treatment/Exercise - 10/11/17 1015      Exercises   Exercises   Neck      Neck Exercises: Machines for Strengthening   UBE (Upper Arm Bike)  Lvl 2.5 - 3' fwd/3' back    Lat Pull  10# x 10      Neck Exercises: Theraband   Rows  15 reps;Red    Rows Limitations  cues to isolate scap retraction & avoid excessive rotation of shoulders into extension    Horizontal ABduction  10 reps;Red    Horizontal ABduction Limitations  standing (back along inside of doorframe for postural awareness and tactile input for scap retraction)    Other Theraband Exercises  Standing scap retraction with alt shoulder flexion + opp shoulder extension with yellow TB x10      Neck Exercises: Standing   Wall Push Ups  10 reps    Wall Push Ups Limitations  arms length from wall    Other Standing Exercises  X to V with yellow TB x10      Manual Therapy   Manual Therapy  Joint mobilization;Soft tissue  mobilization;Myofascial release;Passive ROM;Manual Traction    Manual therapy comments  Hooklying    Joint Mobilization  Grade II-III cervical CPAs; 1st & 2nd rib inf mobs    Soft tissue mobilization  STM to B UT, LS, cervical paraspinals, suboccipitals; emphasis on R    Myofascial Release  manual suboccipital release & manual TPR to R UT & LS    Passive ROM  manual L UT & LS stretches; gentle cervical PROM in all directions    Manual Traction  gentle cervical distraction 4 x 30"             PT Education - 10/11/17 1100    Education provided  Yes    Education Details  HEP update - progression of scap retraction/horizontal abduction to standing; progression of rows to red TB    Person(s) Educated  Patient    Methods  Explanation;Demonstration    Comprehension  Verbalized understanding;Returned demonstration       PT Short Term Goals - 09/30/17 0843      PT SHORT TERM GOAL #1   Title  Pt will verbalize/demonstrate understanding of neutral spine and shoulder posture and body mechanics to minimize neck strain    Status  Achieved      PT SHORT TERM GOAL #2   Title   Independent with initial HEP    Status  Achieved        PT Long Term Goals - 09/23/17 1021      PT LONG TERM GOAL #1   Title  Independent with ongoing/advanced HEP    Status  On-going      PT LONG TERM GOAL #2   Title  Cervical ROM WFL w/o increased neck pain     Status  On-going      PT LONG TERM GOAL #3   Title  Pt will report neck pain improved by 50% or greater upon rising in the morning from sleeping    Status  On-going      PT LONG TERM GOAL #4   Title  Pt will report ability to turn head while driving w/o increased neck pain to allow her to safely check her blindspot(s)    Status  On-going            Plan - 10/11/17 1020    Clinical Impression Statement  [name] w/o reports of a headache in greater than 1 week, but still noting pain ("crick in my neck") on R side of neck and upper shoulder. Pain better after manual therapy with good tolerance for exercise progression with continued emphasis on postural awareness and scapular activation for improved stability. Pt also noting some chronic LBP, therefore provided cueing for awareness of abdominal bracing during exercises to promote lumbar stabilization.    Rehab Potential  Good    PT Treatment/Interventions  Patient/family education;Neuromuscular re-education;Therapeutic exercise;Therapeutic activities;ADLs/Self Care Home Management;Manual techniques;Dry needling;Taping;Electrical Stimulation;Moist Heat;Cryotherapy;Traction;Iontophoresis 4mg /ml Dexamethasone;Ultrasound    Consulted and Agree with Plan of Care  Patient       Patient will benefit from skilled therapeutic intervention in order to improve the following deficits and impairments:  Pain, Decreased range of motion, Postural dysfunction, Impaired flexibility, Increased muscle spasms, Decreased strength, Impaired UE functional use, Decreased activity tolerance, Improper body mechanics  Visit Diagnosis: Cervicalgia  Abnormal posture  Other muscle  spasm  Muscle weakness (generalized)     Problem List Patient Active Problem List   Diagnosis Date Noted  . S/P total knee arthroplasty   . HTN (hypertension)   .  Osteoarthritis   . PVC (premature ventricular contraction) 03/13/2013  . Other specified cardiac dysrhythmias(427.89) 03/13/2013    Maria Rowland, PT, MPT 10/11/2017, 11:27 AM  [facility] [address]  [place], [place], [address] Phone: [phone]   Fax:  [phone]  Name: Jilliam Bellmore MRN: [id number] Date of Birth: 19-Jan-1941

## 2017-10-14 ENCOUNTER — Ambulatory Visit: Payer: Medicare Other

## 2017-10-14 DIAGNOSIS — M62838 Other muscle spasm: Secondary | ICD-10-CM | POA: Diagnosis not present

## 2017-10-14 DIAGNOSIS — M6281 Muscle weakness (generalized): Secondary | ICD-10-CM | POA: Diagnosis not present

## 2017-10-14 DIAGNOSIS — M542 Cervicalgia: Secondary | ICD-10-CM

## 2017-10-14 DIAGNOSIS — R293 Abnormal posture: Secondary | ICD-10-CM | POA: Diagnosis not present

## 2017-10-14 NOTE — Therapy (Signed)
[facility] [address]  [place], [place], [address] Phone: [phone]   Fax:  [phone]  Physical Therapy Treatment  Patient Details  Name: Maria Rowland MRN: [id number] Date of Birth: 1940/07/22 Referring Provider: Leeroy Cha, MD   Encounter Date: 10/14/2017  PT End of Session - 10/14/17 1022    Visit Number  8    Number of Visits  12    Date for PT Re-Evaluation  11/04/17    Authorization Type  Medicare    PT Start Time  1018    PT Stop Time  1112    PT Time Calculation (min)  54 min    Activity Tolerance  Patient tolerated treatment well    Behavior During Therapy  [facility] for tasks assessed/performed       Past Medical History:  Diagnosis Date  . Constipation   . HTN (hypertension)   . Irregular heart beat   . Osteoarthritis   . S/P total knee arthroplasty   . Shortness of breath    when she has palpitations    Past Surgical History:  Procedure Laterality Date  . APPENDECTOMY    . CARPAL TUNNEL RELEASE    . CHOLECYSTECTOMY    . COLONOSCOPY    . EYE SURGERY Right    cataract with lens implant  . hip replacement 2011    . surgical repair of left hand    . TONSILLECTOMY    . TOTAL KNEE ARTHROPLASTY Right 04/09/2013   Procedure: TOTAL KNEE ARTHROPLASTY;  Surgeon: Vickey Huger, MD;  Location: [place];  Service: Orthopedics;  Laterality: Right;    There were no vitals filed for this visit.  Subjective Assessment - 10/14/17 1020    Subjective  Pt. noting she had had some R UT/neck pain since last night without known trigger.      Patient Stated Goals  "help the pain"    Currently in Pain?  Yes    Pain Score  2     Pain Location  Neck    Pain Type  Chronic pain    Pain Radiating Towards  Extending into R and L UT     Pain Onset  More than a month ago    Pain Frequency  Constant    Multiple Pain Sites  No                       [facility] Adult PT Treatment/Exercise -  10/14/17 1200      Self-Care   Self-Care  Other Self-Care Comments    Other Self-Care Comments   Instruction and setup of proper use of MT home TENS unit; pt. verbalized understanding       Neck Exercises: Machines for Strengthening   UBE (Upper Arm Bike)  Lvl 2.5 - 3' fwd/3' back      Moist Heat Therapy   Number Minutes Moist Heat  12 Minutes    Moist Heat Location  Cervical upper back      Electrical Stimulation   Electrical Stimulation Location  B UT/cervical paraspinals    Electrical Stimulation Action  MT TENS unit  for pt. to become comfortable using for home     Electrical Stimulation Parameters  100Hz , intensity to pt. tolerance    Electrical Stimulation Goals  Pain;Tone      Manual Therapy   Manual Therapy  Soft tissue mobilization;Myofascial release;Passive ROM;Manual Traction    Manual therapy comments  Hooklying  Soft tissue mobilization  STM to B UT, LS, cervical paraspinals, suboccipitals  - focusing on R UT in area of tendernes     Myofascial Release  manual suboccipital release & manual TPR to R UT     Passive ROM  manual L UT & LS stretches; gentle cervical PROM in all directions    Manual Traction  gentle cervical distraction 2 x 30"      Neck Exercises: Stretches   Upper Trapezius Stretch  Right;Left;30 seconds;1 rep    Levator Stretch  Right;Left;30 seconds    Levator Stretch Limitations  B             PT Education - 10/14/17 1214    Education provided  Yes    Education Details  TENS unit educational handout     Person(s) Educated  Patient    Methods  Explanation;Verbal cues;Handout    Comprehension  Verbalized understanding;Verbal cues required;Need further instruction       PT Short Term Goals - 09/30/17 0843      PT SHORT TERM GOAL #1   Title  Pt will verbalize/demonstrate understanding of neutral spine and shoulder posture and body mechanics to minimize neck strain    Status  Achieved      PT SHORT TERM GOAL #2   Title  Independent  with initial HEP    Status  Achieved        PT Long Term Goals - 09/23/17 1021      PT LONG TERM GOAL #1   Title  Independent with ongoing/advanced HEP    Status  On-going      PT LONG TERM GOAL #2   Title  Cervical ROM WFL w/o increased neck pain     Status  On-going      PT LONG TERM GOAL #3   Title  Pt will report neck pain improved by 50% or greater upon rising in the morning from sleeping    Status  On-going      PT LONG TERM GOAL #4   Title  Pt will report ability to turn head while driving w/o increased neck pain to allow her to safely check her blindspot(s)    Status  On-going            Plan - 10/14/17 1220    Clinical Impression Statement  Pt. reporting some pain in R UT today, which she attributes to "sleeping wrong or something" last night.  Did find relief with manual therapy today.  Treatment focused on education and handout of MT TENS unit as pt. has received approval from insurance for this.  Pt. instructed on proper use and setup and verbalized understanding.  Ended treatment with application of home TENS unit with moist heat to cervical/UT to reduce muscular tone and pain.  Pt. Leaving treatment pain free.  Will plan to answer further questions regarding home TENS unit and continue to progress next visit.      PT Treatment/Interventions  Patient/family education;Neuromuscular re-education;Therapeutic exercise;Therapeutic activities;ADLs/Self Care Home Management;Manual techniques;Dry needling;Taping;Electrical Stimulation;Moist Heat;Cryotherapy;Traction;Iontophoresis 4mg /ml Dexamethasone;Ultrasound    Consulted and Agree with Plan of Care  Patient       Patient will benefit from skilled therapeutic intervention in order to improve the following deficits and impairments:  Pain, Decreased range of motion, Postural dysfunction, Impaired flexibility, Increased muscle spasms, Decreased strength, Impaired UE functional use, Decreased activity tolerance, Improper body  mechanics  Visit Diagnosis: Cervicalgia  Abnormal posture  Other muscle spasm  Muscle weakness (generalized)  Problem List Patient Active Problem List   Diagnosis Date Noted  . S/P total knee arthroplasty   . HTN (hypertension)   . Osteoarthritis   . PVC (premature ventricular contraction) 03/13/2013  . Other specified cardiac dysrhythmias(427.89) 03/13/2013    Bess Harvest, PTA 10/14/17 12:31 PM  [facility] [address]  [place], [place], [address] Phone: [phone]   Fax:  [phone]  Name: Maria Rowland MRN: [id number] Date of Birth: [birth year]

## 2017-10-14 NOTE — Patient Instructions (Signed)
TENS stands for Transcutaneous Electrical Nerve Stimulation. In other words, electrical impulses are allowed to pass through the skin in order to excite a nerve.  ° °Purpose and Use of TENS:  °TENS is a method used to manage acute and chronic pain without the use of drugs. It has been effective in managing pain associated with surgery, sprains, strains, trauma, rheumatoid arthritis, and neuralgias. It is a non-addictive, low risk, and non-invasive technique used to control pain. It is not, by any means, a curative form of treatment.  ° °How TENS Works:  °Most TENS units are a small pocket-sized unit powered by one 9 volt battery. Attached to the outside of the unit are two lead wires where two pins and/or snaps connect on each wire. All units come with a set of four reusable pads or electrodes. These are placed on the skin surrounding the area involved. By inserting the leads into  the pads, the electricity can pass from the unit making the circuit complete.  °As the intensity is turned up slowly, the electrical current enters the body from the electrodes through the skin to the surrounding nerve fibers. This triggers the release of hormones from within the body. These hormones contain pain relievers. By increasing the circulation of these hormones, the person’s pain may be lessened. It is also believed that the electrical stimulation itself helps to block the pain messages being sent to the brain, thus also decreasing the body’s perception of pain.  ° °Hazards:  °TENS units are NOT to be used by patients with PACEMAKERS, DEFIBRILLATORS, DIABETIC PUMPS, PREGNANT WOMEN, and patients with SEIZURE DISORDERS.  °TENS units are NOT to be used over the heart, throat, brain, or spinal cord.  °One of the major side effects from the TENS unit may be skin irritation. Some people may develop a rash if they are sensitive to the materials used in the electrodes or the connecting wires.  ° °Wear the unit for 15 min.  ° °Avoid  overuse due the body getting used to the stem making it not as effective over time.  ° °

## 2017-10-18 ENCOUNTER — Ambulatory Visit: Payer: Medicare Other

## 2017-10-18 DIAGNOSIS — M62838 Other muscle spasm: Secondary | ICD-10-CM | POA: Diagnosis not present

## 2017-10-18 DIAGNOSIS — M542 Cervicalgia: Secondary | ICD-10-CM

## 2017-10-18 DIAGNOSIS — M6281 Muscle weakness (generalized): Secondary | ICD-10-CM

## 2017-10-18 DIAGNOSIS — R293 Abnormal posture: Secondary | ICD-10-CM

## 2017-10-18 NOTE — Therapy (Signed)
[facility] [address]  [place], [place], [address] Phone: [phone]   Fax:  [phone]  Physical Therapy Treatment  Patient Details  Name: Maria Rowland MRN: [id number] Date of Birth: Jun 21, 1940 Referring Provider: Leeroy Cha, MD   Encounter Date: 10/18/2017  PT End of Session - 10/18/17 1019    Visit Number  9    Number of Visits  12    Date for PT Re-Evaluation  11/04/17    Authorization Type  Medicare    PT Start Time  1016    PT Stop Time  1110 ended treatment with 10 min moist heat    PT Time Calculation (min)  54 min    Activity Tolerance  Patient tolerated treatment well    Behavior During Therapy  [facility] for tasks assessed/performed       Past Medical History:  Diagnosis Date  . Constipation   . HTN (hypertension)   . Irregular heart beat   . Osteoarthritis   . S/P total knee arthroplasty   . Shortness of breath    when she has palpitations    Past Surgical History:  Procedure Laterality Date  . APPENDECTOMY    . CARPAL TUNNEL RELEASE    . CHOLECYSTECTOMY    . COLONOSCOPY    . EYE SURGERY Right    cataract with lens implant  . hip replacement 2011    . surgical repair of left hand    . TONSILLECTOMY    . TOTAL KNEE ARTHROPLASTY Right 04/09/2013   Procedure: TOTAL KNEE ARTHROPLASTY;  Surgeon: Vickey Huger, MD;  Location: [place];  Service: Orthopedics;  Laterality: Right;    There were no vitals filed for this visit.  Subjective Assessment - 10/18/17 1018    Subjective  Pt. reporting decreased pain over weekend and has felt a reduction of 90% in overall pain since starting therapy.     Patient Stated Goals  "help the pain"    Currently in Pain?  No/denies    Pain Score  0-No pain    Multiple Pain Sites  No                       [facility] Adult PT Treatment/Exercise - 10/18/17 1034      Neck Exercises: Machines for Strengthening   UBE (Upper Arm Bike)  Lvl 2.5  - 3' fwd/3' back    Cybex Row  10#  x 15 reps - low handles     Lat Pull  10# x 15      Neck Exercises: Theraband   Shoulder Extension  15 reps;Other (comment)    Shoulder Extension Limitations  yellow TB     Rows  10 reps;Green    Rows Limitations  Cues to reduce scap. elevation       Neck Exercises: Seated   Cervical Rotation  Both;10 reps 5" hold each way      Moist Heat Therapy   Number Minutes Moist Heat  10 Minutes    Moist Heat Location  Cervical      Manual Therapy   Manual Therapy  Soft tissue mobilization;Myofascial release;Passive ROM    Manual therapy comments  Hooklying    Soft tissue mobilization  STM to B UT, LS, cervical paraspinals, suboccipitals  - reduced tenderness in R UT today however still somewhat tender     Myofascial Release  Manual TPR to R UT     Passive ROM  Manual L UT & LS stretches; gentle cervical PROM in all directions      Neck Exercises: Stretches   Upper Trapezius Stretch  Right;Left;30 seconds;1 rep light overpressure from therapist                PT Short Term Goals - 09/30/17 0843      PT SHORT TERM GOAL #1   Title  Pt will verbalize/demonstrate understanding of neutral spine and shoulder posture and body mechanics to minimize neck strain    Status  Achieved      PT SHORT TERM GOAL #2   Title  Independent with initial HEP    Status  Achieved        PT Long Term Goals - 09/23/17 1021      PT LONG TERM GOAL #1   Title  Independent with ongoing/advanced HEP    Status  On-going      PT LONG TERM GOAL #2   Title  Cervical ROM WFL w/o increased neck pain     Status  On-going      PT LONG TERM GOAL #3   Title  Pt will report neck pain improved by 50% or greater upon rising in the morning from sleeping    Status  On-going      PT LONG TERM GOAL #4   Title  Pt will report ability to turn head while driving w/o increased neck pain to allow her to safely check her blindspot(s)    Status  On-going            Plan -  10/18/17 1021    Clinical Impression Statement  Altie doing well today reporting, "I haven't really been feeling any pain this weekend".  Notes ~ 90% improvement in overall pain levels since starting therapy.  Tolerated progression of scapular strengthening machines and band work well today.  Requiring less frequent cueing for proper technique now with therex.  Notes improved tolerance for driving and checking blind spots now.  Progressing well at this point.      PT Treatment/Interventions  Patient/family education;Neuromuscular re-education;Therapeutic exercise;Therapeutic activities;ADLs/Self Care Home Management;Manual techniques;Dry needling;Taping;Electrical Stimulation;Moist Heat;Cryotherapy;Traction;Iontophoresis 4mg /ml Dexamethasone;Ultrasound    Consulted and Agree with Plan of Care  Patient       Patient will benefit from skilled therapeutic intervention in order to improve the following deficits and impairments:  Pain, Decreased range of motion, Postural dysfunction, Impaired flexibility, Increased muscle spasms, Decreased strength, Impaired UE functional use, Decreased activity tolerance, Improper body mechanics  Visit Diagnosis: Cervicalgia  Abnormal posture  Other muscle spasm  Muscle weakness (generalized)     Problem List Patient Active Problem List   Diagnosis Date Noted  . S/P total knee arthroplasty   . HTN (hypertension)   . Osteoarthritis   . PVC (premature ventricular contraction) 03/13/2013  . Other specified cardiac dysrhythmias(427.89) 03/13/2013    Bess Harvest, PTA 10/18/17 12:30 PM   [facility] [address]  [place], [place], [address] Phone: [phone]   Fax:  [phone]  Name: Narely Nobles MRN: [id number] Date of Birth: [birth year]

## 2017-10-21 ENCOUNTER — Ambulatory Visit: Payer: Medicare Other

## 2017-10-21 DIAGNOSIS — R293 Abnormal posture: Secondary | ICD-10-CM

## 2017-10-21 DIAGNOSIS — M62838 Other muscle spasm: Secondary | ICD-10-CM | POA: Diagnosis not present

## 2017-10-21 DIAGNOSIS — M542 Cervicalgia: Secondary | ICD-10-CM | POA: Diagnosis not present

## 2017-10-21 DIAGNOSIS — M6281 Muscle weakness (generalized): Secondary | ICD-10-CM

## 2017-10-21 NOTE — Therapy (Addendum)
[facility] [address]  [place], [place], [address] Phone: [phone]   Fax:  [phone]  Physical Therapy Treatment  Patient Details  Name: Maria Rowland MRN: [id number] Date of Birth: [birth year] Referring Provider: Leeroy Cha, MD  Progress Note  Reporting Period 09/20/17 to 10/21/17  See note below for Objective Data and Assessment of Progress/Goals.    Encounter Date: 10/21/2017  PT End of Session - 10/21/17 1022    Visit Number  10    Number of Visits  12    Date for PT Re-Evaluation  11/04/17    Authorization Type  Medicare    PT Start Time  1015    PT Stop Time  1115    PT Time Calculation (min)  60 min    Activity Tolerance  Patient tolerated treatment well    Behavior During Therapy  WFL for tasks assessed/performed       Past Medical History:  Diagnosis Date  . Constipation   . HTN (hypertension)   . Irregular heart beat   . Osteoarthritis   . S/P total knee arthroplasty   . Shortness of breath    when she has palpitations    Past Surgical History:  Procedure Laterality Date  . APPENDECTOMY    . CARPAL TUNNEL RELEASE    . CHOLECYSTECTOMY    . COLONOSCOPY    . EYE SURGERY Right    cataract with lens implant  . hip replacement 2011    . surgical repair of left hand    . TONSILLECTOMY    . TOTAL KNEE ARTHROPLASTY Right 04/09/2013   Procedure: TOTAL KNEE ARTHROPLASTY;  Surgeon: Vickey Huger, MD;  Location: [facility];  Service: Orthopedics;  Laterality: Right;    There were no vitals filed for this visit.  Subjective Assessment - 10/21/17 1026    Patient Stated Goals  "help the pain"    Currently in Pain?  No/denies    Pain Score  0-No pain    Multiple Pain Sites  No         [facility] PT Assessment - 10/21/17 1039      AROM   AROM Assessment Site  Cervical    Cervical Flexion  55    Cervical Extension  33    Cervical - Right Side Bend  33    Cervical - Left Side Bend  30     Cervical - Right Rotation  54    Cervical - Left Rotation  60      Strength   Right/Left Shoulder  Right;Left    Right Shoulder Flexion  4+/5    Right Shoulder ABduction  4/5    Right Shoulder Internal Rotation  4+/5    Right Shoulder External Rotation  4/5    Left Shoulder Flexion  4+/5    Left Shoulder ABduction  4/5 mild pain on max effort     Left Shoulder Internal Rotation  4+/5      FOTO (5.31.19):  66% (34% limitation)              [facility] Adult PT Treatment/Exercise - 10/21/17 1046      Neck Exercises: Machines for Strengthening   UBE (Upper Arm Bike)  Lvl 2.5 - 3' fwd/3' back      Neck Exercises: Theraband   Other Theraband Exercises  B shoulder flexion, scaption with long yellow TB in standing x 10 reps each way       Neck  Exercises: Standing   Other Standing Exercises  B standing shoulder flexion, scaption with long yellow TB x 12 reps       Neck Exercises: Prone   Neck Retraction  15 reps;3 secs Cueing required for proper motion       Moist Heat Therapy   Number Minutes Moist Heat  15 Minutes    Moist Heat Location  Cervical      Electrical Stimulation   Electrical Stimulation Location  B UT/cervical paraspinals    Electrical Stimulation Action  IFC    Electrical Stimulation Parameters  to tolerance, 15'    Electrical Stimulation Goals  Tone      Manual Therapy   Manual Therapy  Soft tissue mobilization;Passive ROM    Manual therapy comments  Hooklying    Soft tissue mobilization  STM to L-sided cerv paraspinals in area of tenderness    Passive ROM  Manual L UT & LS stretches; gentle cervical PROM in all directions               PT Short Term Goals - 09/30/17 7209      PT SHORT TERM GOAL #1   Title  Pt will verbalize/demonstrate understanding of neutral spine and shoulder posture and body mechanics to minimize neck strain    Status  Achieved      PT SHORT TERM GOAL #2   Title  Independent with initial HEP    Status  Achieved         PT Long Term Goals - 10/21/17 1036      PT LONG TERM GOAL #1   Title  Independent with ongoing/advanced HEP    Status  Partially Met met for current HEP       PT LONG TERM GOAL #2   Title  Cervical ROM WFL w/o increased neck pain     Status  Partially Met met for cervical flexion, L rotation       PT LONG TERM GOAL #3   Title  Pt will report neck pain improved by 50% or greater upon rising in the morning from sleeping    Status  Achieved Notes 90% improvement in neck pain upon rising in mornings       PT LONG TERM GOAL #4   Title  Pt will report ability to turn head while driving w/o increased neck pain to allow her to safely check her blindspot(s)    Status  Achieved            Plan - 10/21/17 1023    Clinical Impression Statement  Maria Rowland has made good progress with therapy thus far.  Able to meet or partially meet LTG's and on track to fully meet remaining goals.  Maria Rowland notes she now only feels limited by neck "stiffness" with tasks such as carrying laundry baskets at home.  Notes she still feels neck "stiffness" turning head to end range R rotation looking back to back seat of car.  Pt. has altered positioning at home as to reduce cervical strain such as purchasing stand to improve posture while reading in evenings.  Making good progress toward goals cervical AROM goal meeting goal for flexion and L rotation motions allowing pt. to check blind spot in car while driving improving safety.  Pt. will continue to benefit from further skilled therapy to improve functional cervical ROM and improve activity tolerance.      PT Treatment/Interventions  Patient/family education;Neuromuscular re-education;Therapeutic exercise;Therapeutic activities;ADLs/Self Care Home Management;Manual techniques;Dry needling;Taping;Electrical Stimulation;Moist Heat;Cryotherapy;Traction;Iontophoresis 66m/ml  Dexamethasone;Ultrasound    Consulted and Agree with Plan of Care  Patient       Patient will  benefit from skilled therapeutic intervention in order to improve the following deficits and impairments:  Pain, Decreased range of motion, Postural dysfunction, Impaired flexibility, Increased muscle spasms, Decreased strength, Impaired UE functional use, Decreased activity tolerance, Improper body mechanics  Visit Diagnosis: Cervicalgia  Abnormal posture  Other muscle spasm  Muscle weakness (generalized)     Problem List Patient Active Problem List   Diagnosis Date Noted  . S/P total knee arthroplasty   . HTN (hypertension)   . Osteoarthritis   . PVC (premature ventricular contraction) 03/13/2013  . Other specified cardiac dysrhythmias(427.89) 03/13/2013    Bess Harvest, PTA 10/21/17 12:43 PM  [facility] [address]  [place], [place], [address] Phone: [phone]   Fax:  [phone]  Name: Maria Rowland MRN: [id number] Date of Birth: May 11, 1941  Maria Rowland reporting 90% improvement in neck pain upon rising in the morning and feels like neck pain is overall improved since start of PT with improving functional motion of neck w/o pain limitation with activities such as turning head while driving. Mild restrictions still evident in cervical ROM but improved since start of PT.  STGs met and LTGs met or nearly met with pt on track to transition to HEP at end of current episode and pt in agreement with this plan.  Percival Spanish, PT, MPT 10/25/17, 11:37 AM  [facility] [place], [place], [address] Phone: [phone]   Fax:  [phone]

## 2017-10-25 ENCOUNTER — Encounter: Payer: Self-pay | Admitting: Physical Therapy

## 2017-10-25 ENCOUNTER — Ambulatory Visit: Payer: Medicare Other | Attending: Internal Medicine | Admitting: Physical Therapy

## 2017-10-25 DIAGNOSIS — M542 Cervicalgia: Secondary | ICD-10-CM | POA: Insufficient documentation

## 2017-10-25 DIAGNOSIS — R293 Abnormal posture: Secondary | ICD-10-CM | POA: Diagnosis not present

## 2017-10-25 DIAGNOSIS — M6281 Muscle weakness (generalized): Secondary | ICD-10-CM | POA: Diagnosis not present

## 2017-10-25 DIAGNOSIS — M62838 Other muscle spasm: Secondary | ICD-10-CM | POA: Diagnosis not present

## 2017-10-25 NOTE — Therapy (Signed)
[facility] [address]  [place], [place], [address] Phone: [phone]   Fax:  [phone]  Physical Therapy Treatment  Patient Details  Name: Maria Rowland MRN: [id number] Date of Birth: 11/17/1940 Referring Provider: Leeroy Cha, MD   Encounter Date: 10/25/2017  PT End of Session - 10/25/17 1020    Visit Number  11    Number of Visits  12    Date for PT Re-Evaluation  11/04/17    Authorization Type  Medicare    PT Start Time  1020    PT Stop Time  1100    PT Time Calculation (min)  40 min    Activity Tolerance  Patient tolerated treatment well    Behavior During Therapy  [facility] for tasks assessed/performed       Past Medical History:  Diagnosis Date  . Constipation   . HTN (hypertension)   . Irregular heart beat   . Osteoarthritis   . S/P total knee arthroplasty   . Shortness of breath    when she has palpitations    Past Surgical History:  Procedure Laterality Date  . APPENDECTOMY    . CARPAL TUNNEL RELEASE    . CHOLECYSTECTOMY    . COLONOSCOPY    . EYE SURGERY Right    cataract with lens implant  . hip replacement 2011    . surgical repair of left hand    . TONSILLECTOMY    . TOTAL KNEE ARTHROPLASTY Right 04/09/2013   Procedure: TOTAL KNEE ARTHROPLASTY;  Surgeon: Vickey Huger, MD;  Location: [facility];  Service: Orthopedics;  Laterality: Right;    There were no vitals filed for this visit.  Subjective Assessment - 10/25/17 1022    Subjective  Pt feels like "I'm doing pretty good". States she "put it to the test over the weekend" and woke up this morning with a little soreness, but noting bad. Feels like it is at a level that she can probably manage.    Patient Stated Goals  "help the pain"    Currently in Pain?  Yes    Pain Score  2     Pain Location  Neck    Pain Orientation  Right;Left;Upper    Pain Descriptors / Indicators  Aching    Pain Type  Chronic pain                        [facility] Adult PT Treatment/Exercise - 10/25/17 1020      Exercises   Exercises  Neck      Neck Exercises: Machines for Strengthening   UBE (Upper Arm Bike)  Lvl 3.0 - 3' fwd/3' back      Neck Exercises: Theraband   Shoulder Extension  10 reps;Red    Shoulder Extension Limitations  standing    Rows  15 reps;Green    Shoulder External Rotation  10 reps;Red    Shoulder External Rotation Limitations  standing against pool noodle on wall    Horizontal ABduction  15 reps;Red    Horizontal ABduction Limitations  standing against pool noodle on wall    Other Theraband Exercises  Standing scap retraction with alt shoulder flexion + opp shoulder extension with red TB x10 (standing against pool noodle)      Neck Exercises: Standing   Other Standing Exercises  X to V with yellow TB x10      Neck Exercises: Seated   Neck Retraction  10  reps;3 secs    Neck Retraction Limitations  yellow TB resistance - seated in straight back chair to stabilize torso (avoiding arching back or leaning back)      Neck Exercises: Prone   Neck Retraction  10 reps;3 secs    Neck Retraction Limitations  POE - poor tolerance for prone positionin due to hip             PT Education - 10/25/17 1100    Education provided  Yes    Education Details  HEP review/update    Person(s) Educated  Patient    Methods  Explanation;Demonstration;Handout    Comprehension  Verbalized understanding;Returned demonstration       PT Short Term Goals - 09/30/17 0843      PT SHORT TERM GOAL #1   Title  Pt will verbalize/demonstrate understanding of neutral spine and shoulder posture and body mechanics to minimize neck strain    Status  Achieved      PT SHORT TERM GOAL #2   Title  Independent with initial HEP    Status  Achieved        PT Long Term Goals - 10/21/17 1036      PT LONG TERM GOAL #1   Title  Independent with ongoing/advanced HEP    Status  Partially Met met for current  HEP       PT LONG TERM GOAL #2   Title  Cervical ROM WFL w/o increased neck pain     Status  Partially Met met for cervical flexion, L rotation       PT LONG TERM GOAL #3   Title  Pt will report neck pain improved by 50% or greater upon rising in the morning from sleeping    Status  Achieved Notes 90% improvement in neck pain upon rising in mornings       PT LONG TERM GOAL #4   Title  Pt will report ability to turn head while driving w/o increased neck pain to allow her to safely check her blindspot(s)    Status  Achieved            Plan - 10/25/17 1025    Clinical Impression Statement  Maria Rowland noting good improvement with PT and is feeling like she is at a point where she would like to try transitioning to a HEP, therefore HEP reviewed and updated with theraband resistance progresssion as well as inclusion of a few additional HEP exercises based on activities previously completed during therapy sessions. Good tolerance for all exercises reviewed other than limited tolerance for prone positioning with gravity resisted cervical retraction, therefore modified to seated resisted cervical retraction with yellow TB - still with difficulty isolating cervical retraction, but improved when performed in straight back chair to stabilize torso. Will plan for discharge assessment next visit, addressing any remaining questions re: HEP, and anticipate transition to HEP with probable 30 day hold.    PT Treatment/Interventions  Patient/family education;Neuromuscular re-education;Therapeutic exercise;Therapeutic activities;ADLs/Self Care Home Management;Manual techniques;Dry needling;Taping;Electrical Stimulation;Moist Heat;Cryotherapy;Traction;Iontophoresis 61m/ml Dexamethasone;Ultrasound    Consulted and Agree with Plan of Care  Patient       Patient will benefit from skilled therapeutic intervention in order to improve the following deficits and impairments:  Pain, Decreased range of motion, Postural  dysfunction, Impaired flexibility, Increased muscle spasms, Decreased strength, Impaired UE functional use, Decreased activity tolerance, Improper body mechanics  Visit Diagnosis: Cervicalgia  Abnormal posture  Other muscle spasm  Muscle weakness (generalized)     Problem  List Patient Active Problem List   Diagnosis Date Noted  . S/P total knee arthroplasty   . HTN (hypertension)   . Osteoarthritis   . PVC (premature ventricular contraction) 03/13/2013  . Other specified cardiac dysrhythmias(427.89) 03/13/2013    Percival Spanish, PT, MPT 10/25/2017, 11:29 AM  [facility] [address]  [place], [place], [address] Phone: [phone]   Fax:  [phone]  Name: Maria Rowland MRN: [id number] Date of Birth: [birth year]

## 2017-10-28 ENCOUNTER — Ambulatory Visit: Payer: Medicare Other | Admitting: Physical Therapy

## 2017-10-28 ENCOUNTER — Encounter: Payer: Self-pay | Admitting: Physical Therapy

## 2017-10-28 DIAGNOSIS — M542 Cervicalgia: Secondary | ICD-10-CM

## 2017-10-28 DIAGNOSIS — R293 Abnormal posture: Secondary | ICD-10-CM

## 2017-10-28 DIAGNOSIS — M62838 Other muscle spasm: Secondary | ICD-10-CM

## 2017-10-28 DIAGNOSIS — M6281 Muscle weakness (generalized): Secondary | ICD-10-CM | POA: Diagnosis not present

## 2017-10-28 NOTE — Therapy (Addendum)
[facility] [address]  [place], [place], [address] Phone: [phone]   Fax:  [phone]  Physical Therapy Treatment  Patient Details  Name: Shaakira Borrero MRN: [id number] Date of Birth: 1940-08-03 Referring Provider: Leeroy Cha, MD   Encounter Date: 10/28/2017  PT End of Session - 10/28/17 1016    Visit Number  12    Number of Visits  12    Date for PT Re-Evaluation  11/04/17    Authorization Type  Medicare    PT Start Time  1016    PT Stop Time  1103    PT Time Calculation (min)  47 min    Activity Tolerance  Patient tolerated treatment well    Behavior During Therapy  [facility] for tasks assessed/performed       Past Medical History:  Diagnosis Date  . Constipation   . HTN (hypertension)   . Irregular heart beat   . Osteoarthritis   . S/P total knee arthroplasty   . Shortness of breath    when she has palpitations    Past Surgical History:  Procedure Laterality Date  . APPENDECTOMY    . CARPAL TUNNEL RELEASE    . CHOLECYSTECTOMY    . COLONOSCOPY    . EYE SURGERY Right    cataract with lens implant  . hip replacement 2011    . surgical repair of left hand    . TONSILLECTOMY    . TOTAL KNEE ARTHROPLASTY Right 04/09/2013   Procedure: TOTAL KNEE ARTHROPLASTY;  Surgeon: Vickey Huger, MD;  Location: [facility];  Service: Orthopedics;  Laterality: Right;    There were no vitals filed for this visit.  Subjective Assessment - 10/28/17 1019    Subjective  Pt denies pain today, but notes a little stiffness. Feels ready to transition to HEP as planned.    Patient Stated Goals  "help the pain"    Currently in Pain?  No/denies         [facility] PT Assessment - 10/28/17 1016      Assessment   Medical Diagnosis  Neck pain    Referring Provider  Leeroy Cha, MD    Onset Date/Surgical Date  -- 6-9 months    Hand Dominance  Right      Observation/Other Assessments   Focus on Therapeutic  Outcomes (FOTO)   Neck 63% (37% limitation)      AROM   Cervical Flexion  56    Cervical Extension  42    Cervical - Right Side Bend  33    Cervical - Left Side Bend  34    Cervical - Right Rotation  62    Cervical - Left Rotation  55      Strength   Right Shoulder Flexion  4+/5    Right Shoulder ABduction  4+/5    Right Shoulder Internal Rotation  4+/5    Right Shoulder External Rotation  4+/5    Left Shoulder Flexion  4+/5    Left Shoulder ABduction  4+/5    Left Shoulder Internal Rotation  4+/5    Left Shoulder External Rotation  4+/5                   [facility] Adult PT Treatment/Exercise - 10/28/17 1016      Exercises   Exercises  Neck      Neck Exercises: Machines for Strengthening   UBE (Upper Arm Bike)  Lvl 3.0 - 3' fwd/3'  back    Cybex Row  10#  x 15 reps - narrow grip    Cybex Chest Press  5# x 10 - narrow grip    Lat Pull  10# x 15      Neck Exercises: Seated   Neck Retraction  15 reps;3 secs    Neck Retraction Limitations  yellow TB resistance - seated in straight back chair to stabilize torso (avoiding arching back or leaning back)             PT Education - 10/28/17 1100    Education provided  Yes    Education Details  Community exercise resources & recommended gym machines    Person(s) Educated  Patient    Methods  Explanation;Demonstration;Handout    Comprehension  Verbalized understanding;Returned demonstration       PT Short Term Goals - 09/30/17 0843      PT SHORT TERM GOAL #1   Title  Pt will verbalize/demonstrate understanding of neutral spine and shoulder posture and body mechanics to minimize neck strain    Status  Achieved      PT SHORT TERM GOAL #2   Title  Independent with initial HEP    Status  Achieved        PT Long Term Goals - 10/28/17 1023      PT LONG TERM GOAL #1   Title  Independent with ongoing/advanced HEP    Status  Achieved      PT LONG TERM GOAL #2   Title  Cervical ROM WFL w/o increased neck pain      Status  Achieved      PT LONG TERM GOAL #3   Title  Pt will report neck pain improved by 50% or greater upon rising in the morning from sleeping    Status  Achieved      PT LONG TERM GOAL #4   Title  Pt will report ability to turn head while driving w/o increased neck pain to allow her to safely check her blindspot(s)    Status  Achieved            Plan - 10/28/17 1034    Clinical Impression Statement  Hoyle Sauer reporting 95% improvment in neck pain and ability to move her head functionally. Pain denies pain currently, noting only mild sitffness. Cervical ROM now [facility] w/o increased pain allowing her to adequately turn her head while driving and does not note any further restrictions in daily activity due to neck pain. Pt feels confident with current HEP and is ready to transition to HEP. Pt mentioned possibility of incorporating some gym activity/exercises into her workout routine, therefore introduced appropriate use of relevant resistance machines to allow for carryover to gym exercise upon completion of PT and provided info on local community exercise programs geared toward seniors. Will proceed with transition to HEP at this time and will place pt on hold for 30 days in the event that issues arise with HEP.    PT Treatment/Interventions  Patient/family education;Neuromuscular re-education;Therapeutic exercise;Therapeutic activities;ADLs/Self Care Home Management;Manual techniques;Dry needling;Taping;Electrical Stimulation;Moist Heat;Cryotherapy;Traction;Iontophoresis 86m/ml Dexamethasone;Ultrasound    PT Next Visit Plan  30 day hold    Consulted and Agree with Plan of Care  Patient       Patient will benefit from skilled therapeutic intervention in order to improve the following deficits and impairments:  Pain, Decreased range of motion, Postural dysfunction, Impaired flexibility, Increased muscle spasms, Decreased strength, Impaired UE functional use, Decreased activity tolerance,  Improper body  mechanics  Visit Diagnosis: Cervicalgia  Abnormal posture  Other muscle spasm  Muscle weakness (generalized)     Problem List Patient Active Problem List   Diagnosis Date Noted  . S/P total knee arthroplasty   . HTN (hypertension)   . Osteoarthritis   . PVC (premature ventricular contraction) 03/13/2013  . Other specified cardiac dysrhythmias(427.89) 03/13/2013    Percival Spanish, PT, MPT 10/28/2017, 1:05 PM  [facility] [address]  [place], [place], [address] Phone: [phone]   Fax:  [phone]  Name: Aireanna Luellen MRN: [id number] Date of Birth: Aug 20, 1940  PHYSICAL THERAPY DISCHARGE SUMMARY  Visits from Start of Care: 12  Current functional level related to goals / functional outcomes:   Refer to above clinical impression for status as of last visit on 10/28/17. Pt was placed on hold for 30 days and has not needed to return to PT, therefore will proceed with discharge from PT for this episode.   Remaining deficits:   As above.   Education / Equipment:   HEP  Plan: Patient agrees to discharge.  Patient goals were met. Patient is being discharged due to meeting the stated rehab goals.  ?????     Percival Spanish, PT, MPT 12/06/17, 2:54 PM  [facility] [address]  [place], [place], [address] Phone: [phone]   Fax:  [phone]

## 2017-10-28 NOTE — Patient Instructions (Addendum)
Community Occupational psychologist of Services Cost  A Matter of Balance Class locations vary. Call [place] on Aging for more information.  http://dawson-may.com/ [phone] 8-Session program addressing the fear of falling and increasing activity levels of older adults Free to minimal cost  A.C.T. By [employer] [address], [place], [place] [address].  BetaBlues.dk [phone]  Personal training, gym, classes including Silver Sneakers* and ACTion for Aging Adults Fee-based  A.H.O.Y. (Add Health to [place]) Airs on Time [employer] 13, M-F at [place]: [employer],  [place] [facility] [place],  [place], [facility], [address] [facility], [place], [place], [place] [address]  [place] Location: Sharrell Ku. [employer] [place]      [phone]  [phone]  [phone]  [phone]  [phone]  [phone]  [phone]  [phone]  [phone]  [phone]    [phone] A total-body conditioning class for adults 77 and older; designed to increase muscular strength, endurance, range of movement, flexibility, balance, agility and coordination Free  [facility] [place], [place] [address] [place]      [address]      [phone]      Pilate's class for individualsreturning to exercise after an injury, before or after surgery or for individuals with complex musculoskeletal issues; designed to improve strength, balance , flexibility      $15/class  [place] [address] [place], [place] [address] [email or web address] [place] classes for beginners to advanced [place], [place] [address] Seniorcenter_0 [email or web address] [place] Chair Exercises Free, ages 77 and older; Ages 43-59 fee based  Marvia Pickles, [facility] [address] [place], [place] [address] Seniorcenter_1 .Beverlee Nims [phone]  A.H.O.Y. Tai Chi Fee-based Donation based or free  [place] Class locations vary.  Call or email Angela Burke or view website for more information. Info_[email or web address] .com GainPain.com.[email or web address].html [phone] Ongoing classes at local YMCAs and gyms Fee-based  Silver Sneakers A.C.T. By [place] [employer]: [facility] [place] [phone]  [phone] Classes designed for older adults who want to improve their strength, flexibility, balance and endurance.   Silver sneakers is covered by some insurance plans and includes a fitness center membership at participating locations. Find out more by calling [phone] or visiting [email or web address] Covered by some insurance plans  [facility] [place] [phone] A.H.O.Y., fitness room, personal training, fitness classes for injury prevention, strength, balance, flexibility, water fitness classes Ages 55+: $36 for 6 months; Ages 80-54: $13 for 6 months  Tai Chi for Everybody [facility] [address] [place] [place], [place] [address] Taichiforeverybody_3 .Patsi Sears [phone] Tai Chi classes for beginners to advanced; geared for seniors Donation Based  [employer] (Hel[place] Others Participate in Exercise     Loyal Gambler. Rosana Hoes, PhD, [place] pgdavis_[email or web address] .edu [place]     [phone]     A comprehensive fitness program for adults.  The program paris senior-level undergraduates Kinesiology students with adults who desire to learn how to exercise safely.  Includes a structural exercise class focusing on functional fitnesss     $100/semester in fall and spring; $75 in summer (no trainers)    *Silver Sneakers is covered by some Personal assistant and includes a  Radio producer at participating locations.  Find out more by calling [phone] or visiting [email or web address]  For additional health and human services resources for senior adults, please contact SeniorLine at [phone] in [place] and [place] at [phone] in all other areas.

## 2017-11-01 ENCOUNTER — Ambulatory Visit: Payer: Medicare Other

## 2017-11-04 ENCOUNTER — Ambulatory Visit: Payer: Medicare Other | Admitting: Physical Therapy

## 2017-12-19 DIAGNOSIS — M62838 Other muscle spasm: Secondary | ICD-10-CM | POA: Diagnosis not present

## 2017-12-19 DIAGNOSIS — M1610 Unilateral primary osteoarthritis, unspecified hip: Secondary | ICD-10-CM | POA: Diagnosis not present

## 2017-12-19 DIAGNOSIS — I10 Essential (primary) hypertension: Secondary | ICD-10-CM | POA: Diagnosis not present

## 2018-01-02 DIAGNOSIS — M25552 Pain in left hip: Secondary | ICD-10-CM | POA: Diagnosis not present

## 2018-01-02 DIAGNOSIS — M7062 Trochanteric bursitis, left hip: Secondary | ICD-10-CM | POA: Diagnosis not present

## 2018-01-09 ENCOUNTER — Encounter: Payer: Self-pay | Admitting: Physical Therapy

## 2018-01-09 ENCOUNTER — Other Ambulatory Visit: Payer: Self-pay

## 2018-01-09 ENCOUNTER — Ambulatory Visit: Payer: Medicare Other | Attending: Orthopedic Surgery | Admitting: Physical Therapy

## 2018-01-09 DIAGNOSIS — R262 Difficulty in walking, not elsewhere classified: Secondary | ICD-10-CM | POA: Diagnosis not present

## 2018-01-09 DIAGNOSIS — M6281 Muscle weakness (generalized): Secondary | ICD-10-CM | POA: Insufficient documentation

## 2018-01-09 DIAGNOSIS — M25652 Stiffness of left hip, not elsewhere classified: Secondary | ICD-10-CM | POA: Insufficient documentation

## 2018-01-09 DIAGNOSIS — M25552 Pain in left hip: Secondary | ICD-10-CM | POA: Insufficient documentation

## 2018-01-09 NOTE — Therapy (Signed)
[facility] [place] [address]  [place], [place], [address] Phone: [phone]   Fax:  [phone]  Physical Therapy Evaluation  Patient Details  Name: Maria Rowland MRN: [id number] Date of Birth: 13-Sep-1940 Referring Provider: Rod Can, MD   Encounter Date: 01/09/2018  PT End of Session - 01/09/18 0934    Visit Number  1    Number of Visits  13    Date for PT Re-Evaluation  02/20/18    Authorization Type  Medicare    PT Start Time  0846    PT Stop Time  0927    PT Time Calculation (min)  41 min    Activity Tolerance  Patient tolerated treatment well;Patient limited by pain    Behavior During Therapy  [facility] for tasks assessed/performed       Past Medical History:  Diagnosis Date  . Constipation   . HTN (hypertension)   . Irregular heart beat   . Osteoarthritis   . S/P total knee arthroplasty   . Shortness of breath    when she has palpitations    Past Surgical History:  Procedure Laterality Date  . APPENDECTOMY    . CARPAL TUNNEL RELEASE    . CHOLECYSTECTOMY    . COLONOSCOPY    . EYE SURGERY Right    cataract with lens implant  . hip replacement 2011    . surgical repair of left hand    . TONSILLECTOMY    . TOTAL KNEE ARTHROPLASTY Right 04/09/2013   Procedure: TOTAL KNEE ARTHROPLASTY;  Surgeon: Vickey Huger, MD;  Location: [facility];  Service: Orthopedics;  Laterality: Right;    There were no vitals filed for this visit.   Subjective Assessment - 01/09/18 0848    Subjective  Patient reports L hip pain started in April 2019. Hx of L THA in 2011. Pain started after starting to walk outside for exercise more often. Reports she had to use a cane d/t pain for a couple days but has since improved and able to walk without AD at this time. Reports difficulty with stairs, prolonged standing and walking.  Pain is centered along L anterolateral hip and intermittently radiates down lateral leg to knee. Denies N/T.  Reports L foot swelling and slight numbness since THA.    Pertinent History  SOB, s/p TKA, OA, irregular heart beat, HTN, constipation, surgical repair of L hand, hip replacement 2011, carpal tunnel release    Limitations  Lifting;Standing;Walking;House hold activities;Sitting    How long can you sit comfortably?  1 hour    How long can you stand comfortably?  30 min    How long can you walk comfortably?  3 min    Diagnostic tests  per pt- had xray on L hip and was clear    Patient Stated Goals  lose 10 lbs per MD recommendation and increase walking    Currently in Pain?  No/denies    Pain Location  Hip    Pain Orientation  Left    Pain Type  Chronic pain    Aggravating Factors   Reports difficulty with stairs, prolonged standing and walking.    Pain Relieving Factors  moving, [place]         [facility] PT Assessment - 01/09/18 0904      Assessment   Medical Diagnosis  Trochanteric bursitis of L hip    Referring Provider  Rod Can, MD    Onset Date/Surgical Date  09/09/17  Next MD Visit  02/14/18    Prior Therapy  Yes- for neck      Precautions   Precautions  None      Restrictions   Weight Bearing Restrictions  No      Balance Screen   Has the patient fallen in the past 6 months  No    Has the patient had a decrease in activity level because of a fear of falling?   No    Is the patient reluctant to leave their home because of a fear of falling?   No      Home Environment   Living Environment  Private residence    Living Arrangements  Children    Available Help at Discharge  Family    Type of [place] Access  Level entry    [place]  Two level    Alternate Level Stairs-Number of Steps  [place] - single point      Prior Function   Level of [place]  Retired    Leisure  work in yard      Cognition   Overall Cognitive Status  Within [place] for tasks  assessed      Observation/Other Assessments   Focus on Therapeutic Outcomes (FOTO)   Hip: 49 (51% limited, 45% predicted)      Sensation   Light Touch  Appears Intact      Coordination   Gross Motor Movements are Fluid and Coordinated  Yes      Posture/Postural Control   Posture/Postural Control  Postural limitations    Postural Limitations  Rounded Shoulders;Forward head      ROM / Strength   AROM / PROM / Strength  Strength;AROM;PROM      AROM   AROM Assessment Site  Hip    Right/Left Hip  Left;Right    Right Hip Flexion  110    Right Hip External Rotation   45    Right Hip Internal Rotation   25    Left Hip Flexion  100    Left Hip External Rotation   17    Left Hip Internal Rotation   27   pain in L hip     PROM   PROM Assessment Site  Hip    Right/Left Hip  Left    Left Hip External Rotation   24    Left Hip Internal Rotation   40      Strength   Strength Assessment Site  Hip;Knee;Ankle    Right/Left Hip  Right;Left    Right Hip Flexion  4-/5    Right Hip ABduction  4-/5    Right Hip ADduction  4/5    Left Hip Flexion  4-/5    Left Hip ABduction  4-/5    Left Hip ADduction  4/5    Right/Left Knee  Right;Left    Right Knee Flexion  4/5    Right Knee Extension  4/5    Left Knee Flexion  4/5    Left Knee Extension  4/5    Right/Left Ankle  Right;Left    Right Ankle Dorsiflexion  4+/5    Right Ankle Plantar Flexion  4+/5    Left Ankle Dorsiflexion  4/5    Left Ankle Plantar Flexion  4+/5      Flexibility   Soft Tissue Assessment /  Muscle Length  yes    Hamstrings  WFL    Quadriceps  severely tight, L>R    ITB  moderately tight L      Palpation   Palpation comment  TTP over L trochanter with raised and painful prominence; TTP along ITB      Ambulation/Gait   Gait Pattern  Step-through pattern;Decreased weight shift to left;Decreased stance time - left                Objective measurements completed on examination: See above findings.               PT Education - 01/09/18 0933    Education Details  prognosis, POC, HEP    Person(s) Educated  Patient    Methods  Explanation;Demonstration;Tactile cues;Verbal cues;Handout    Comprehension  Returned demonstration;Verbalized understanding       PT Short Term Goals - 01/09/18 0942      PT SHORT TERM GOAL #1   Title  Patient to be independent with initial HEP.    Time  3    Period  Weeks    Status  New    Target Date  01/30/18        PT Long Term Goals - 01/09/18 0942      PT LONG TERM GOAL #1   Title  Patient to be independent with advanced HEP.    Time  6    Period  Weeks    Status  New    Target Date  02/20/18      PT LONG TERM GOAL #2   Title  Patient to demonstrate symmetrical and pain free L hip AROM to opposite side.    Time  6    Period  Weeks    Status  New    Target Date  02/20/18      PT LONG TERM GOAL #3   Title  Patient to demonstrate >=4+/5 strength in B LEs.    Time  6    Period  Weeks    Status  New    Target Date  02/20/18      PT LONG TERM GOAL #4   Title  Patient to demonstrate reciprocal stair climbing up/down 13 steps with 1 handrail without pain limiting and good mechanics.    Time  6    Period  Weeks    Status  New    Target Date  02/20/18      PT LONG TERM GOAL #5   Title  Patient to report tolerance of 20 min of walking without pain limiting.     Time  6    Period  Weeks    Status  New    Target Date  02/20/18             Plan - 01/09/18 0936    Clinical Impression Statement  Patient is a 77y/o F with hx of L THA in 2011 presenting to [facility] with L hip pain of 4 months duration. Pain is centered along L anterolateral hip and intermittently radiates down lateral leg to knee. Aggravating factors include stairs, prolonged standing and walking. Patient today with limited and painful L hip AROM, decreased B LE strength, decreased flexibility in B quads and L TFL, and significant tenderness over L trochanter  and ITB. Educated on and received handout on gentle stretching HEP; advised patient not to push into pain. Patient reported understanding. Would benefit from skilled PT services 2x/week for 6 weeks to  address aforementioned impairments.     Clinical Presentation  Stable    Clinical Decision Making  Low    Rehab Potential  Good    PT Frequency  2x / week    PT Duration  6 weeks    PT Treatment/Interventions  ADLs/Self Care Home Management;Cryotherapy;Electrical Stimulation;Iontophoresis 4mg /ml Dexamethasone;Moist Heat;Therapeutic exercise;Therapeutic activities;Functional mobility training;Stair training;Gait training;DME Instruction;Ultrasound;Balance training;Neuromuscular re-education;Patient/family education;Manual techniques;Taping;Splinting;Energy conservation;Dry needling;Passive range of motion;Scar mobilization    PT Next Visit Plan  reassess HEP    Consulted and Agree with Plan of Care  Patient       Patient will benefit from skilled therapeutic intervention in order to improve the following deficits and impairments:  Hypomobility, Decreased activity tolerance, Decreased strength, Pain, Difficulty walking, Decreased balance, Decreased range of motion, Improper body mechanics, Postural dysfunction, Impaired flexibility  Visit Diagnosis: Pain in left hip  Stiffness of left hip, not elsewhere classified  Muscle weakness (generalized)  Difficulty in walking, not elsewhere classified     Problem List Patient Active Problem List   Diagnosis Date Noted  . S/P total knee arthroplasty   . HTN (hypertension)   . Osteoarthritis   . PVC (premature ventricular contraction) 03/13/2013  . Other specified cardiac dysrhythmias(427.89) 03/13/2013     Janene Harvey, PT, DPT 01/09/18 9:45 AM   [facility] [address]  [place], [place], [address] Phone: [phone]   Fax:  [phone]  Name: Maria Rowland MRN:  [id number] Date of Birth: 09-29-1940

## 2018-01-10 ENCOUNTER — Ambulatory Visit: Payer: Medicare Other

## 2018-01-10 DIAGNOSIS — M6281 Muscle weakness (generalized): Secondary | ICD-10-CM

## 2018-01-10 DIAGNOSIS — M25552 Pain in left hip: Secondary | ICD-10-CM | POA: Diagnosis not present

## 2018-01-10 DIAGNOSIS — R262 Difficulty in walking, not elsewhere classified: Secondary | ICD-10-CM

## 2018-01-10 DIAGNOSIS — M25652 Stiffness of left hip, not elsewhere classified: Secondary | ICD-10-CM

## 2018-01-10 NOTE — Therapy (Signed)
[facility] [place] [address]  [place], [place], [address] Phone: [phone]   Fax:  [phone]  Physical Therapy Treatment  Patient Details  Name: Maria Rowland MRN: [id number] Date of Birth: 1940-07-28 Referring Provider: Rod Can, MD   Encounter Date: 01/10/2018  PT End of Session - 01/10/18 1539    Visit Number  2    Number of Visits  13    Date for PT Re-Evaluation  02/20/18    Authorization Type  Medicare    PT Start Time  1532    PT Stop Time  1615    PT Time Calculation (min)  43 min    Activity Tolerance  Patient tolerated treatment well;Patient limited by pain    Behavior During Therapy  [facility] for tasks assessed/performed       Past Medical History:  Diagnosis Date  . Constipation   . HTN (hypertension)   . Irregular heart beat   . Osteoarthritis   . S/P total knee arthroplasty   . Shortness of breath    when she has palpitations    Past Surgical History:  Procedure Laterality Date  . APPENDECTOMY    . CARPAL TUNNEL RELEASE    . CHOLECYSTECTOMY    . COLONOSCOPY    . EYE SURGERY Right    cataract with lens implant  . hip replacement 2011    . surgical repair of left hand    . TONSILLECTOMY    . TOTAL KNEE ARTHROPLASTY Right 04/09/2013   Procedure: TOTAL KNEE ARTHROPLASTY;  Surgeon: Vickey Huger, MD;  Location: [facility];  Service: Orthopedics;  Laterality: Right;    There were no vitals filed for this visit.  Subjective Assessment - 01/10/18 1539    Subjective  Pt. noting no issues with HEP.      Pertinent History  SOB, s/p TKA, OA, irregular heart beat, HTN, constipation, surgical repair of L hand, hip replacement 2011, carpal tunnel release    Diagnostic tests  per pt- had xray on L hip and was clear    Patient Stated Goals  lose 10 lbs per MD recommendation and increase walking    Currently in Pain?  No/denies    Multiple Pain Sites  No                       [facility]  Adult PT Treatment/Exercise - 01/10/18 1549      Self-Care   Self-Care  Other Self-Care Comments    Other Self-Care Comments   self ball release on wall to L glutes       Knee/Hip Exercises: Stretches   Passive Hamstring Stretch  Left;30 seconds;1 rep    Passive Hamstring Stretch Limitations  strap     Hip Flexor Stretch  Left;60 seconds;1 rep    Hip Flexor Stretch Limitations  strap in mod thomas position     ITB Stretch  Left;2 reps;30 seconds    ITB Stretch Limitations  with strap       Knee/Hip Exercises: Aerobic   Recumbent Bike  Lvl 2, 7 min       Knee/Hip Exercises: Supine   Bridges with Clamshell  Both;10 reps   with isometric hip abd/ER with red TB at knees    Other Supine Knee/Hip Exercises  Hooklying alternating clam shell with red TB at knees + abdom. bracing x 10 reps each side    improved tolerance vs clam shell    Other  Supine Knee/Hip Exercises  Alternating march + abdom. bracing with red TB at knees x 10 reps each way       Manual Therapy   Manual Therapy  Soft tissue mobilization    Manual therapy comments  sidelying     Soft tissue mobilization  STM to L glutes/piriformis - very ttp                PT Short Term Goals - 01/10/18 1540      PT SHORT TERM GOAL #1   Title  Patient to be independent with initial HEP.    Time  3    Period  Weeks    Status  On-going        PT Long Term Goals - 01/10/18 1540      PT LONG TERM GOAL #1   Title  Patient to be independent with advanced HEP.    Time  6    Period  Weeks    Status  On-going      PT LONG TERM GOAL #2   Title  Patient to demonstrate symmetrical and pain free L hip AROM to opposite side.    Time  6    Period  Weeks    Status  On-going      PT LONG TERM GOAL #3   Title  Patient to demonstrate >=4+/5 strength in B LEs.    Time  6    Period  Weeks    Status  On-going      PT LONG TERM GOAL #4   Title  Patient to demonstrate reciprocal stair climbing up/down 13 steps with 1 handrail  without pain limiting and good mechanics.    Time  6    Period  Weeks    Status  On-going      PT LONG TERM GOAL #5   Title  Patient to report tolerance of 20 min of walking without pain limiting.     Time  6    Period  Weeks    Status  On-going            Plan - 01/10/18 1540    Clinical Impression Statement  Maria Rowland doing well today.  Did require some cueing with clamshell activity to avoid painful arc and avoid trunk rotation substitutions today.  Tolerated all gentle proximal hip strengthening activities will today.  Ended visit pain free thus modalities deferred.      PT Treatment/Interventions  ADLs/Self Care Home Management;Cryotherapy;Electrical Stimulation;Iontophoresis 4mg /ml Dexamethasone;Moist Heat;Therapeutic exercise;Therapeutic activities;Functional mobility training;Stair training;Gait training;DME Instruction;Ultrasound;Balance training;Neuromuscular re-education;Patient/family education;Manual techniques;Taping;Splinting;Energy conservation;Dry needling;Passive range of motion;Scar mobilization    Consulted and Agree with Plan of Care  Patient       Patient will benefit from skilled therapeutic intervention in order to improve the following deficits and impairments:  Hypomobility, Decreased activity tolerance, Decreased strength, Pain, Difficulty walking, Decreased balance, Decreased range of motion, Improper body mechanics, Postural dysfunction, Impaired flexibility  Visit Diagnosis: Pain in left hip  Stiffness of left hip, not elsewhere classified  Muscle weakness (generalized)  Difficulty in walking, not elsewhere classified     Problem List Patient Active Problem List   Diagnosis Date Noted  . S/P total knee arthroplasty   . HTN (hypertension)   . Osteoarthritis   . PVC (premature ventricular contraction) 03/13/2013  . Other specified cardiac dysrhythmias(427.89) 03/13/2013    Bess Harvest, PTA 01/10/18 5:25 PM   [facility] [place] [address]  [place], [place], [address] Phone: [phone]   Fax:  [phone]  Name: Maria Rowland MRN: [id number] Date of Birth: 1941-04-08

## 2018-01-16 ENCOUNTER — Ambulatory Visit: Payer: Medicare Other

## 2018-01-16 DIAGNOSIS — R262 Difficulty in walking, not elsewhere classified: Secondary | ICD-10-CM

## 2018-01-16 DIAGNOSIS — M6281 Muscle weakness (generalized): Secondary | ICD-10-CM

## 2018-01-16 DIAGNOSIS — M25552 Pain in left hip: Secondary | ICD-10-CM | POA: Diagnosis not present

## 2018-01-16 DIAGNOSIS — M25652 Stiffness of left hip, not elsewhere classified: Secondary | ICD-10-CM

## 2018-01-16 NOTE — Therapy (Signed)
[facility] [address]  [place], [place], [address] Phone: [phone]   Fax:  [phone]  Physical Therapy Treatment  Patient Details  Name: Maria Rowland MRN: [id number] Date of Birth: 1941/01/19 Referring Provider: Rod Can, MD   Encounter Date: 01/16/2018  PT End of Session - 01/16/18 1017    Visit Number  3    Number of Visits  13    Date for PT Re-Evaluation  02/20/18    Authorization Type  Medicare    PT Start Time  1013    PT Stop Time  1051    PT Time Calculation (min)  38 min    Activity Tolerance  Patient tolerated treatment well;Patient limited by pain    Behavior During Therapy  [facility] for tasks assessed/performed       Past Medical History:  Diagnosis Date  . Constipation   . HTN (hypertension)   . Irregular heart beat   . Osteoarthritis   . S/P total knee arthroplasty   . Shortness of breath    when she has palpitations    Past Surgical History:  Procedure Laterality Date  . APPENDECTOMY    . CARPAL TUNNEL RELEASE    . CHOLECYSTECTOMY    . COLONOSCOPY    . EYE SURGERY Right    cataract with lens implant  . hip replacement 2011    . surgical repair of left hand    . TONSILLECTOMY    . TOTAL KNEE ARTHROPLASTY Right 04/09/2013   Procedure: TOTAL KNEE ARTHROPLASTY;  Surgeon: Vickey Huger, MD;  Location: [facility];  Service: Orthopedics;  Laterality: Right;    There were no vitals filed for this visit.  Subjective Assessment - 01/16/18 1016    Subjective  Maria Rowland noting L hip pain after ~ 100 ft walking at this point.      Pertinent History  SOB, s/p TKA, OA, irregular heart beat, HTN, constipation, surgical repair of L hand, hip replacement 2011, carpal tunnel release    Diagnostic tests  per pt- had xray on L hip and was clear    Patient Stated Goals  lose 10 lbs per MD recommendation and increase walking    Currently in Pain?  No/denies    Pain Score  0-No pain   rises to 2-3/10  while walking into clinic    Pain Location  Hip    Pain Orientation  Left    Pain Descriptors / Indicators  Aching    Pain Type  Chronic pain    Aggravating Factors   prolonged walking     Pain Relieving Factors  rest     Multiple Pain Sites  No                       [facility] Adult PT Treatment/Exercise - 01/16/18 1035      Knee/Hip Exercises: Stretches   Piriformis Stretch  Left      Knee/Hip Exercises: Aerobic   Recumbent Bike  Lvl 2, 7 min       Knee/Hip Exercises: Standing   Functional Squat  10 reps;3 seconds    Functional Squat Limitations  at chair with yellow TB at knees       Knee/Hip Exercises: Seated   Other Seated Knee/Hip Exercises  L fitter leg press (1 black, 1 blue band) x 15 reps     Marching  Right;Left   x 12 reps   Marching Limitations  with  yellow TB       Knee/Hip Exercises: Supine   Bridges  Both;10 reps;Strengthening    Bridges with Clamshell  Both   x 12 reps with isometric yellow TB hip abd/ER    Other Supine Knee/Hip Exercises  Hooklying alternating clam shell with yellow TB at knees + abdom. bracing x 10 reps each side    reduced tension as pt. noting some L hip tenderness today     Knee/Hip Exercises: Sidelying   Clams  L clam shell with yellow TB x 0      Manual Therapy   Manual Therapy  Passive ROM    Manual therapy comments  sidelying and supine     Passive ROM  L glute, HS, ITB,                PT Short Term Goals - 01/10/18 1540      PT SHORT TERM GOAL #1   Title  Patient to be independent with initial HEP.    Time  3    Period  Weeks    Status  On-going        PT Long Term Goals - 01/10/18 1540      PT LONG TERM GOAL #1   Title  Patient to be independent with advanced HEP.    Time  6    Period  Weeks    Status  On-going      PT LONG TERM GOAL #2   Title  Patient to demonstrate symmetrical and pain free L hip AROM to opposite side.    Time  6    Period  Weeks    Status  On-going      PT LONG  TERM GOAL #3   Title  Patient to demonstrate >=4+/5 strength in B LEs.    Time  6    Period  Weeks    Status  On-going      PT LONG TERM GOAL #4   Title  Patient to demonstrate reciprocal stair climbing up/down 13 steps with 1 handrail without pain limiting and good mechanics.    Time  6    Period  Weeks    Status  On-going      PT LONG TERM GOAL #5   Title  Patient to report tolerance of 20 min of walking without pain limiting.     Time  6    Period  Weeks    Status  On-going            Plan - 01/16/18 1017    Clinical Impression Statement  Maria Rowland doing well today however notes she did have L hip pain after walking ~ 100 ft. into clinic at low level, which has not subsided.  Tolerated mild progression of proximal hip strengthening activities well in session today and ended visit pain free thus modalities deferred.  Will continue to benefit from further skilled therapy to improve LE strength and decrease strain on proximal hips for improved functional activities tolerance.      PT Treatment/Interventions  ADLs/Self Care Home Management;Cryotherapy;Electrical Stimulation;Iontophoresis 4mg /ml Dexamethasone;Moist Heat;Therapeutic exercise;Therapeutic activities;Functional mobility training;Stair training;Gait training;DME Instruction;Ultrasound;Balance training;Neuromuscular re-education;Patient/family education;Manual techniques;Taping;Splinting;Energy conservation;Dry needling;Passive range of motion;Scar mobilization    Consulted and Agree with Plan of Care  Patient       Patient will benefit from skilled therapeutic intervention in order to improve the following deficits and impairments:  Hypomobility, Decreased activity tolerance, Decreased strength, Pain, Difficulty walking, Decreased balance, Decreased range of motion, Improper  body mechanics, Postural dysfunction, Impaired flexibility  Visit Diagnosis: Pain in left hip  Stiffness of left hip, not elsewhere  classified  Muscle weakness (generalized)  Difficulty in walking, not elsewhere classified     Problem List Patient Active Problem List   Diagnosis Date Noted  . S/P total knee arthroplasty   . HTN (hypertension)   . Osteoarthritis   . PVC (premature ventricular contraction) 03/13/2013  . Other specified cardiac dysrhythmias(427.89) 03/13/2013    Bess Harvest, PTA 01/16/18 12:38 PM   [facility] [address]  [place], [place], [address] Phone: [phone]   Fax:  [phone]  Name: Maria Rowland MRN: [id number] Date of Birth: 1941/04/17

## 2018-01-18 ENCOUNTER — Encounter: Payer: Self-pay | Admitting: Physical Therapy

## 2018-01-18 ENCOUNTER — Ambulatory Visit: Payer: Medicare Other | Admitting: Physical Therapy

## 2018-01-18 DIAGNOSIS — M25652 Stiffness of left hip, not elsewhere classified: Secondary | ICD-10-CM

## 2018-01-18 DIAGNOSIS — R262 Difficulty in walking, not elsewhere classified: Secondary | ICD-10-CM | POA: Diagnosis not present

## 2018-01-18 DIAGNOSIS — M6281 Muscle weakness (generalized): Secondary | ICD-10-CM | POA: Diagnosis not present

## 2018-01-18 DIAGNOSIS — M25552 Pain in left hip: Secondary | ICD-10-CM | POA: Diagnosis not present

## 2018-01-18 NOTE — Therapy (Signed)
[facility] [address]  [place], [place], [address] Phone: [phone]   Fax:  [phone]  Physical Therapy Treatment  Patient Details  Name: Maria Rowland MRN: [id number] Date of Birth: 1941/03/12 Referring Provider: Rod Can, MD   Encounter Date: 01/18/2018  PT End of Session - 01/18/18 1129    Visit Number  4    Number of Visits  13    Date for PT Re-Evaluation  02/20/18    Authorization Type  Medicare    PT Start Time  0932    PT Stop Time  1014    PT Time Calculation (min)  42 min    Activity Tolerance  Patient tolerated treatment well    Behavior During Therapy  [facility] for tasks assessed/performed       Past Medical History:  Diagnosis Date  . Constipation   . HTN (hypertension)   . Irregular heart beat   . Osteoarthritis   . S/P total knee arthroplasty   . Shortness of breath    when she has palpitations    Past Surgical History:  Procedure Laterality Date  . APPENDECTOMY    . CARPAL TUNNEL RELEASE    . CHOLECYSTECTOMY    . COLONOSCOPY    . EYE SURGERY Right    cataract with lens implant  . hip replacement 2011    . surgical repair of left hand    . TONSILLECTOMY    . TOTAL KNEE ARTHROPLASTY Right 04/09/2013   Procedure: TOTAL KNEE ARTHROPLASTY;  Surgeon: Vickey Huger, MD;  Location: [facility];  Service: Orthopedics;  Laterality: Right;    There were no vitals filed for this visit.  Subjective Assessment - 01/18/18 0935    Subjective  Hip isn't bothering her a lot but does start hurting after 658ft of walking.     Pertinent History  SOB, s/p TKA, OA, irregular heart beat, HTN, constipation, surgical repair of L hand, hip replacement 2011, carpal tunnel release    Diagnostic tests  per pt- had xray on L hip and was clear    Patient Stated Goals  lose 10 lbs per MD recommendation and increase walking    Currently in Pain?  Yes    Pain Score  3     Pain Location  Hip    Pain Orientation   Left    Pain Descriptors / Indicators  Aching    Pain Type  Chronic pain                       [facility] Adult PT Treatment/Exercise - 01/18/18 0001      Exercises   Exercises  Knee/Hip      Knee/Hip Exercises: Stretches   Hip Flexor Stretch  Left;30 seconds;2 reps    Hip Flexor Stretch Limitations  strap in mod thomas position to tolerance    ITB Stretch  Left;30 seconds;Limitations;2 reps   cues to avoid trunk rotation; advised to ease off dt discomf   ITB Stretch Limitations  supine strap    Piriformis Stretch  Left;2 reps;30 seconds;Limitations    Piriformis Stretch Limitations  KTOS with manual guidance      Knee/Hip Exercises: Aerobic   Recumbent Bike  Lvl 2, 6 min    causing her some L thigh pain but tolerable     Knee/Hip Exercises: Standing   Functional Squat  10 reps;3 seconds    Functional Squat Limitations  at counter with yellow  TB around knees to tolerance    Other Standing Knee Exercises  sidestepping along counter top with yellow TB around ankles 4x along counter top   cues for upright trunk throughout     Knee/Hip Exercises: Supine   Bridges  Both;10 reps;Strengthening    Bridges with [employer]  Strengthening;1 set;Both;10 reps;Limitations   cues for foot placement   Knee Flexion  Strengthening;Right;Left;10 reps;Limitations;2 sets   L anterior hip/thigh pain after activity that dissipated   Knee Flexion Limitations  resisted hip flexion with red TB with LEs on pball    Other Supine Knee/Hip Exercises  Hooklying alternating clam shell with red TB at knees + abdom. bracing x 10 reps each side       Manual Therapy   Manual Therapy  Soft tissue mobilization    Soft tissue mobilization  STM to L superior glute and piriformis- TTP and soft tissue restriction in these areas, no pain in TFL/ITB               PT Short Term Goals - 01/10/18 1540      PT SHORT TERM GOAL #1   Title  Patient to be independent with initial HEP.    Time  3     Period  Weeks    Status  On-going        PT Long Term Goals - 01/10/18 1540      PT LONG TERM GOAL #1   Title  Patient to be independent with advanced HEP.    Time  6    Period  Weeks    Status  On-going      PT LONG TERM GOAL #2   Title  Patient to demonstrate symmetrical and pain free L hip AROM to opposite side.    Time  6    Period  Weeks    Status  On-going      PT LONG TERM GOAL #3   Title  Patient to demonstrate >=4+/5 strength in B LEs.    Time  6    Period  Weeks    Status  On-going      PT LONG TERM GOAL #4   Title  Patient to demonstrate reciprocal stair climbing up/down 13 steps with 1 handrail without pain limiting and good mechanics.    Time  6    Period  Weeks    Status  On-going      PT LONG TERM GOAL #5   Title  Patient to report tolerance of 20 min of walking without pain limiting.     Time  6    Period  Weeks    Status  On-going            Plan - 01/18/18 1130    Clinical Impression Statement  Patient arrived to session with no new complaints. Tolerated STM to L superior glute, piriformis- TTP and soft tissue restriction to these areas, however no pain at TFL/ITB. Advised patient to try self-STM with ball on wall in these areas. Patient reported understanding. Tolerated mild progression of LE strengthening activities today, however with intermittent and inconsistent reports of discomfort in L thigh/hip. Patient reporting most discomfort with TFL stretch with strap- mild improvement in tolerance after advising patient to ease off. After questioning, patient reporting stretch sensation rather than pain with this activity. Good form with functional squats and good tolerance after introduction of sidestepping with TB resistance. Patient denied increase in pain at end of session.  PT Treatment/Interventions  ADLs/Self Care Home Management;Cryotherapy;Electrical Stimulation;Iontophoresis 4mg /ml Dexamethasone;Moist Heat;Therapeutic exercise;Therapeutic  activities;Functional mobility training;Stair training;Gait training;DME Instruction;Ultrasound;Balance training;Neuromuscular re-education;Patient/family education;Manual techniques;Taping;Splinting;Energy conservation;Dry needling;Passive range of motion;Scar mobilization    Consulted and Agree with Plan of Care  Patient       Patient will benefit from skilled therapeutic intervention in order to improve the following deficits and impairments:  Hypomobility, Decreased activity tolerance, Decreased strength, Pain, Difficulty walking, Decreased balance, Decreased range of motion, Improper body mechanics, Postural dysfunction, Impaired flexibility  Visit Diagnosis: Pain in left hip  Stiffness of left hip, not elsewhere classified  Muscle weakness (generalized)  Difficulty in walking, not elsewhere classified     Problem List Patient Active Problem List   Diagnosis Date Noted  . S/P total knee arthroplasty   . HTN (hypertension)   . Osteoarthritis   . PVC (premature ventricular contraction) 03/13/2013  . Other specified cardiac dysrhythmias(427.89) 03/13/2013    Janene Harvey, PT, DPT 01/18/18 11:33 AM   [facility] [address]  [place], [place], [address] Phone: [phone]   Fax:  [phone]  Name: Maria Rowland MRN: [id number] Date of Birth: 06/06/1940

## 2018-01-24 ENCOUNTER — Ambulatory Visit: Payer: Medicare Other | Attending: Orthopedic Surgery | Admitting: Physical Therapy

## 2018-01-24 ENCOUNTER — Encounter: Payer: Self-pay | Admitting: Physical Therapy

## 2018-01-24 DIAGNOSIS — M6281 Muscle weakness (generalized): Secondary | ICD-10-CM | POA: Diagnosis not present

## 2018-01-24 DIAGNOSIS — R262 Difficulty in walking, not elsewhere classified: Secondary | ICD-10-CM | POA: Diagnosis not present

## 2018-01-24 DIAGNOSIS — M25552 Pain in left hip: Secondary | ICD-10-CM | POA: Diagnosis not present

## 2018-01-24 DIAGNOSIS — M25652 Stiffness of left hip, not elsewhere classified: Secondary | ICD-10-CM

## 2018-01-24 NOTE — Therapy (Signed)
[facility] [address]  [place], [place], [address] Phone: [phone]   Fax:  [phone]  Physical Therapy Treatment  Patient Details  Name: Maria Rowland MRN: [id number] Date of Birth: Oct 20, 1940 Referring Provider: Rod Can, MD   Encounter Date: 01/24/2018  PT End of Session - 01/24/18 1150    Visit Number  5    Number of Visits  13    Date for PT Re-Evaluation  02/20/18    Authorization Type  Medicare    PT Start Time  1101    PT Stop Time  1144    PT Time Calculation (min)  43 min    Equipment Utilized During Treatment  Gait belt    Activity Tolerance  Patient tolerated treatment well    Behavior During Therapy  [facility] for tasks assessed/performed       Past Medical History:  Diagnosis Date  . Constipation   . HTN (hypertension)   . Irregular heart beat   . Osteoarthritis   . S/P total knee arthroplasty   . Shortness of breath    when she has palpitations    Past Surgical History:  Procedure Laterality Date  . APPENDECTOMY    . CARPAL TUNNEL RELEASE    . CHOLECYSTECTOMY    . COLONOSCOPY    . EYE SURGERY Right    cataract with lens implant  . hip replacement 2011    . surgical repair of left hand    . TONSILLECTOMY    . TOTAL KNEE ARTHROPLASTY Right 04/09/2013   Procedure: TOTAL KNEE ARTHROPLASTY;  Surgeon: Vickey Huger, MD;  Location: [facility];  Service: Orthopedics;  Laterality: Right;    There were no vitals filed for this visit.  Subjective Assessment - 01/24/18 1102    Subjective  Reports she is having some L hip pain right at the joint. Did a lot of sitting yesterday and neck feels a bit sore.     Pertinent History  SOB, s/p TKA, OA, irregular heart beat, HTN, constipation, surgical repair of L hand, hip replacement 2011, carpal tunnel release    Diagnostic tests  per pt- had xray on L hip and was clear    Patient Stated Goals  lose 10 lbs per MD recommendation and increase walking     Currently in Pain?  No/denies                       [facility] Adult PT Treatment/Exercise - 01/24/18 0001      Exercises   Exercises  Knee/Hip      Knee/Hip Exercises: Stretches   ITB Stretch  Left;30 seconds;Limitations;2 reps   cues to avoid pushing into pain   ITB Stretch Limitations  standing against wall & counter top    Other Knee/Hip Stretches  L TFL stretch supine with strap 2x30"      Knee/Hip Exercises: Aerobic   Recumbent Bike  Lvl 3, 6 min       Knee/Hip Exercises: Standing   Knee Flexion  Strengthening;Both;1 set;20 reps;Limitations    Knee Flexion Limitations  marching at counter top with 2#    Hip ADduction  --    Hip ADduction Limitations  --    Hip Abduction  Stengthening;Right;Left;1 set;10 reps;Knee straight;Limitations    Abduction Limitations  at counter top; cues to avoid hip flexion    Lateral Step Up  Left;1 set;Hand Hold: 2;10 reps;Step Height: 6";Limitations    Lateral Step  Up Limitations  CGA and 2 hand support with cues to avoid forward trunk lean and foot placement    Forward Step Up  Left;1 set;10 reps;Hand Hold: 1;Step Height: 6";Limitations    Forward Step Up Limitations  CGA; cues for hand support placement; nonpainful popping in L knee    Functional Squat  1 set;15 reps;Limitations   cues to avoid deep squat to avoid knee pain   Functional Squat Limitations  at counter with red TB around knees to tolerance      Manual Therapy   Manual Therapy  Passive ROM    Soft tissue mobilization  TTP over length of TFL but not tolerable- discontonued    Passive ROM  L TFL stretch with PT OP in ober position 2x30"; L mod thomas stretch with PT OP 2x30"   cues to contract abs to avoid back pain            PT Education - 01/24/18 1149    Education Details  update to HEP with specific instruction to use counter top or hand rail support    Person(s) Educated  Patient    Methods  Explanation;Demonstration;Tactile cues;Verbal cues;Handout     Comprehension  Returned demonstration;Verbalized understanding       PT Short Term Goals - 01/24/18 1154      PT SHORT TERM GOAL #1   Title  Patient to be independent with initial HEP.    Time  3    Period  Weeks    Status  Achieved        PT Long Term Goals - 01/10/18 1540      PT LONG TERM GOAL #1   Title  Patient to be independent with advanced HEP.    Time  6    Period  Weeks    Status  On-going      PT LONG TERM GOAL #2   Title  Patient to demonstrate symmetrical and pain free L hip AROM to opposite side.    Time  6    Period  Weeks    Status  On-going      PT LONG TERM GOAL #3   Title  Patient to demonstrate >=4+/5 strength in B LEs.    Time  6    Period  Weeks    Status  On-going      PT LONG TERM GOAL #4   Title  Patient to demonstrate reciprocal stair climbing up/down 13 steps with 1 handrail without pain limiting and good mechanics.    Time  6    Period  Weeks    Status  On-going      PT LONG TERM GOAL #5   Title  Patient to report tolerance of 20 min of walking without pain limiting.     Time  6    Period  Weeks    Status  On-going            Plan - 01/24/18 1151    Clinical Impression Statement  Patient arrived to session with mild L hip pain when walking into clinic.  Patient TTP over TFL but could not tolerate massage. Tolerated passive stretching to L TFL and hip flexor with mild relief. Introduced anterior and lateral step ups with L LE with UE support. Cues given to maintain upright trunk and for foot placement. Patient steady throughout but with report of "feeling weak." Added anterior step up and standing TFL stretch to HEP. Patient reported understanding. Ended session with no  increase in pain.     PT Treatment/Interventions  ADLs/Self Care Home Management;Cryotherapy;Electrical Stimulation;Iontophoresis 4mg /ml Dexamethasone;Moist Heat;Therapeutic exercise;Therapeutic activities;Functional mobility training;Stair training;Gait  training;DME Instruction;Ultrasound;Balance training;Neuromuscular re-education;Patient/family education;Manual techniques;Taping;Splinting;Energy conservation;Dry needling;Passive range of motion;Scar mobilization    Consulted and Agree with Plan of Care  Patient       Patient will benefit from skilled therapeutic intervention in order to improve the following deficits and impairments:  Hypomobility, Decreased activity tolerance, Decreased strength, Pain, Difficulty walking, Decreased balance, Decreased range of motion, Improper body mechanics, Postural dysfunction, Impaired flexibility  Visit Diagnosis: Pain in left hip  Stiffness of left hip, not elsewhere classified  Muscle weakness (generalized)  Difficulty in walking, not elsewhere classified     Problem List Patient Active Problem List   Diagnosis Date Noted  . S/P total knee arthroplasty   . HTN (hypertension)   . Osteoarthritis   . PVC (premature ventricular contraction) 03/13/2013  . Other specified cardiac dysrhythmias(427.89) 03/13/2013    Janene Harvey, PT, DPT 01/24/18 11:56 AM   [facility] [address]  [place], [place], [address] Phone: [phone]   Fax:  [phone]  Name: Maria Rowland MRN: [id number] Date of Birth: 1940/05/25

## 2018-01-26 ENCOUNTER — Ambulatory Visit: Payer: Medicare Other

## 2018-01-26 DIAGNOSIS — R262 Difficulty in walking, not elsewhere classified: Secondary | ICD-10-CM | POA: Diagnosis not present

## 2018-01-26 DIAGNOSIS — M6281 Muscle weakness (generalized): Secondary | ICD-10-CM | POA: Diagnosis not present

## 2018-01-26 DIAGNOSIS — M25652 Stiffness of left hip, not elsewhere classified: Secondary | ICD-10-CM | POA: Diagnosis not present

## 2018-01-26 DIAGNOSIS — M25552 Pain in left hip: Secondary | ICD-10-CM

## 2018-01-26 NOTE — Therapy (Signed)
[facility] [address]  [place], [place], [address] Phone: [phone]   Fax:  [phone]  Physical Therapy Treatment  Patient Details  Name: Maria Rowland MRN: [id number] Date of Birth: [birth year] Referring Provider: Rod Can, MD   Encounter Date: 01/26/2018  PT End of Session - 01/26/18 0805    Visit Number  6    Number of Visits  13    Date for PT Re-Evaluation  02/20/18    Authorization Type  Medicare    PT Start Time  3825    PT Stop Time  0837    PT Time Calculation (min)  40 min    Equipment Utilized During Treatment  --    Activity Tolerance  Patient tolerated treatment well    Behavior During Therapy  [facility] for tasks assessed/performed       Past Medical History:  Diagnosis Date  . Constipation   . HTN (hypertension)   . Irregular heart beat   . Osteoarthritis   . S/P total knee arthroplasty   . Shortness of breath    when she has palpitations    Past Surgical History:  Procedure Laterality Date  . APPENDECTOMY    . CARPAL TUNNEL RELEASE    . CHOLECYSTECTOMY    . COLONOSCOPY    . EYE SURGERY Right    cataract with lens implant  . hip replacement 2011    . surgical repair of left hand    . TONSILLECTOMY    . TOTAL KNEE ARTHROPLASTY Right 04/09/2013   Procedure: TOTAL KNEE ARTHROPLASTY;  Surgeon: Vickey Huger, MD;  Location: [facility];  Service: Orthopedics;  Laterality: Right;    There were no vitals filed for this visit.  Subjective Assessment - 01/26/18 0801    Subjective  Pt. noting she had a "good day",  yesterday and able to walk in grocery store without pain.      Pertinent History  SOB, s/p TKA, OA, irregular heart beat, HTN, constipation, surgical repair of L hand, hip replacement 2011, carpal tunnel release    How long can you walk comfortably?  3 min     Diagnostic tests  per pt- had xray on L hip and was clear    Patient Stated Goals  lose 10 lbs per MD recommendation and  increase walking    Currently in Pain?  No/denies    Pain Score  0-No pain   noting pain rising to 4/10 at worst last night with standing    Pain Location  Hip    Pain Orientation  Left    Pain Descriptors / Indicators  Aching    Pain Type  Chronic pain    Aggravating Factors   prolonged standing, prolonged walking, prolonged sitting     Pain Relieving Factors  rest     Multiple Pain Sites  No                       [facility] Adult PT Treatment/Exercise - 01/26/18 0812      Knee/Hip Exercises: Aerobic   Nustep  Lvl 4, 6 min       Knee/Hip Exercises: Standing   Knee Flexion  Left   x 12 with yellow looped band at counter    Lateral Step Up  Left;10 reps;Hand Hold: 0;Step Height: 6"    Lateral Step Up Limitations  cues for upright posture     Forward Step Up  Step Height:  8";Left;10 reps;Hand Hold: 1    Other Standing Knee Exercises  side stepping with yellow TB at ankles 2 x 20 ft; cues for positioning required       Knee/Hip Exercises: Supine   Bridges with Clamshell  Both;10 reps;Strengthening   with hip abd/ER with red TB at knees      Knee/Hip Exercises: Sidelying   Clams  L clam shell with yellow TB x 10      Manual Therapy   Manual Therapy  Soft tissue mobilization    Manual therapy comments  supine     Soft tissue mobilization  STM to L TFL quad     Passive ROM  L TFL stretch with PT OP in ober position 2x30"                PT Short Term Goals - 01/24/18 1154      PT SHORT TERM GOAL #1   Title  Patient to be independent with initial HEP.    Time  3    Period  Weeks    Status  Achieved        PT Long Term Goals - 01/10/18 1540      PT LONG TERM GOAL #1   Title  Patient to be independent with advanced HEP.    Time  6    Period  Weeks    Status  On-going      PT LONG TERM GOAL #2   Title  Patient to demonstrate symmetrical and pain free L hip AROM to opposite side.    Time  6    Period  Weeks    Status  On-going      PT LONG TERM  GOAL #3   Title  Patient to demonstrate >=4+/5 strength in B LEs.    Time  6    Period  Weeks    Status  On-going      PT LONG TERM GOAL #4   Title  Patient to demonstrate reciprocal stair climbing up/down 13 steps with 1 handrail without pain limiting and good mechanics.    Time  6    Period  Weeks    Status  On-going      PT LONG TERM GOAL #5   Title  Patient to report tolerance of 20 min of walking without pain limiting.     Time  6    Period  Weeks    Status  On-going            Plan - 01/26/18 0805    Clinical Impression Statement  Maria Rowland with improved tolerance for STM to proximal L hip musculature today.  Notes she was able to walk in grocery store without issue today and notes she is feeling well today with reduction in L hip pain.  Tolerated progression of stepping activities today well however did require encouragement throughout therex for increased effort.  Will continue to progress toward goals.      PT Treatment/Interventions  ADLs/Self Care Home Management;Cryotherapy;Electrical Stimulation;Iontophoresis 4mg /ml Dexamethasone;Moist Heat;Therapeutic exercise;Therapeutic activities;Functional mobility training;Stair training;Gait training;DME Instruction;Ultrasound;Balance training;Neuromuscular re-education;Patient/family education;Manual techniques;Taping;Splinting;Energy conservation;Dry needling;Passive range of motion;Scar mobilization    Consulted and Agree with Plan of Care  Patient       Patient will benefit from skilled therapeutic intervention in order to improve the following deficits and impairments:  Hypomobility, Decreased activity tolerance, Decreased strength, Pain, Difficulty walking, Decreased balance, Decreased range of motion, Improper body mechanics, Postural dysfunction, Impaired flexibility  Visit Diagnosis: Pain  in left hip  Stiffness of left hip, not elsewhere classified  Muscle weakness (generalized)  Difficulty in walking, not elsewhere  classified     Problem List Patient Active Problem List   Diagnosis Date Noted  . S/P total knee arthroplasty   . HTN (hypertension)   . Osteoarthritis   . PVC (premature ventricular contraction) 03/13/2013  . Other specified cardiac dysrhythmias(427.89) 03/13/2013    Bess Harvest, PTA 01/26/18 1:10 PM   [facility] [address]  [place], [place], [address] Phone: [phone]   Fax:  [phone]  Name: Maria Rowland MRN: [id number] Date of Birth: 30-Aug-1940

## 2018-01-31 ENCOUNTER — Ambulatory Visit: Payer: Medicare Other | Admitting: Physical Therapy

## 2018-01-31 ENCOUNTER — Encounter: Payer: Self-pay | Admitting: Physical Therapy

## 2018-01-31 DIAGNOSIS — M6281 Muscle weakness (generalized): Secondary | ICD-10-CM | POA: Diagnosis not present

## 2018-01-31 DIAGNOSIS — M25552 Pain in left hip: Secondary | ICD-10-CM | POA: Diagnosis not present

## 2018-01-31 DIAGNOSIS — R262 Difficulty in walking, not elsewhere classified: Secondary | ICD-10-CM

## 2018-01-31 DIAGNOSIS — M25652 Stiffness of left hip, not elsewhere classified: Secondary | ICD-10-CM | POA: Diagnosis not present

## 2018-01-31 NOTE — Therapy (Signed)
[facility] [address]  [place], [place], [address] Phone: [phone]   Fax:  [phone]  Physical Therapy Treatment  Patient Details  Name: Maria Rowland MRN: [id number] Date of Birth: 08-08-1940 Referring Provider: Rod Can, MD   Encounter Date: 01/31/2018  PT End of Session - 01/31/18 1314    Visit Number  7    Number of Visits  13    Date for PT Re-Evaluation  02/20/18    Authorization Type  Medicare    PT Start Time  1314    PT Stop Time  1357    PT Time Calculation (min)  43 min    Activity Tolerance  Patient tolerated treatment well    Behavior During Therapy  [facility] for tasks assessed/performed       Past Medical History:  Diagnosis Date  . Constipation   . HTN (hypertension)   . Irregular heart beat   . Osteoarthritis   . S/P total knee arthroplasty   . Shortness of breath    when she has palpitations    Past Surgical History:  Procedure Laterality Date  . APPENDECTOMY    . CARPAL TUNNEL RELEASE    . CHOLECYSTECTOMY    . COLONOSCOPY    . EYE SURGERY Right    cataract with lens implant  . hip replacement 2011    . surgical repair of left hand    . TONSILLECTOMY    . TOTAL KNEE ARTHROPLASTY Right 04/09/2013   Procedure: TOTAL KNEE ARTHROPLASTY;  Surgeon: Vickey Huger, MD;  Location: [facility];  Service: Orthopedics;  Laterality: Right;    There were no vitals filed for this visit.  Subjective Assessment - 01/31/18 1318    Subjective  Pt denies pain today, but does note some tightness in anterior thighs.    Pertinent History  SOB, s/p TKA, OA, irregular heart beat, HTN, constipation, surgical repair of L hand, hip replacement 2011, carpal tunnel release    How long can you walk comfortably?  3 min     Diagnostic tests  per pt- had xray on L hip and was clear    Patient Stated Goals  lose 10 lbs per MD recommendation and increase walking    Currently in Pain?  No/denies                        [facility] Adult PT Treatment/Exercise - 01/31/18 1314      Exercises   Exercises  Knee/Hip      Knee/Hip Exercises: Stretches   Hip Flexor Stretch  Left;30 seconds;2 reps    Hip Flexor Stretch Limitations  prone with strap & hip extended with folded pillow under knee      Knee/Hip Exercises: Aerobic   Recumbent Bike  L3 x 6 min      Knee/Hip Exercises: Standing   Hip Flexion  Left;Right;10 reps;Knee straight;Stengthening    Hip Flexion Limitations  yellow TB at ankle; 2 pole A    Hip ADduction  Left;Right;10 reps;Strengthening    Hip ADduction Limitations  yellow TB at ankle; 2 pole A    Hip Abduction  Left;Right;10 reps;Stengthening    Abduction Limitations  yellow TB at ankle; 2 pole A    Hip Extension  Left;Right;10 reps;Knee straight;Stengthening    Extension Limitations  yellow TB at ankle; 2 pole A    Forward Step Up  Left;Right;10 reps;Step Height: 6";Hand Hold: 2    Forward  Step Up Limitations  Reviewed from HEP at pt request     Step Down  Left;Right;10 reps;Step Height: 4";Hand Hold: 2    Step Down Limitations  lateral eccentric lowering with light heel touch - UE support on counter               PT Short Term Goals - 01/24/18 1154      PT SHORT TERM GOAL #1   Title  Patient to be independent with initial HEP.    Time  3    Period  Weeks    Status  Achieved        PT Long Term Goals - 01/10/18 1540      PT LONG TERM GOAL #1   Title  Patient to be independent with advanced HEP.    Time  6    Period  Weeks    Status  On-going      PT LONG TERM GOAL #2   Title  Patient to demonstrate symmetrical and pain free L hip AROM to opposite side.    Time  6    Period  Weeks    Status  On-going      PT LONG TERM GOAL #3   Title  Patient to demonstrate >=4+/5 strength in B LEs.    Time  6    Period  Weeks    Status  On-going      PT LONG TERM GOAL #4   Title  Patient to demonstrate reciprocal stair climbing up/down 13  steps with 1 handrail without pain limiting and good mechanics.    Time  6    Period  Weeks    Status  On-going      PT LONG TERM GOAL #5   Title  Patient to report tolerance of 20 min of walking without pain limiting.     Time  6    Period  Weeks    Status  On-going            Plan - 01/31/18 1320    Clinical Impression Statement  Shavona noting benefit from PT with decreasing pain, improving walking tolerance and increasing ease of stair negotiation. HEP step-ups reviewed at pt request as she was questioning which leg should be leading with step up and down. Introduced eccentric step-downs, but pt requiring lower step height due to weakness. B 4 way standing SLR introduced today to target SLS stance stability while strengthening full hip musculature - fatigue noted on stance leg but good tolerance for theraband resistance other than slight discomfort with hip abduction (improved tolerance when range of motion decreased). Pt will continue to benefit from skilled PT to improve proximal LE strength/stability and increase activity tolerance.    PT Treatment/Interventions  ADLs/Self Care Home Management;Cryotherapy;Electrical Stimulation;Iontophoresis 4mg /ml Dexamethasone;Moist Heat;Therapeutic exercise;Therapeutic activities;Functional mobility training;Stair training;Gait training;DME Instruction;Ultrasound;Balance training;Neuromuscular re-education;Patient/family education;Manual techniques;Taping;Splinting;Energy conservation;Dry needling;Passive range of motion;Scar mobilization    Consulted and Agree with Plan of Care  Patient       Patient will benefit from skilled therapeutic intervention in order to improve the following deficits and impairments:  Hypomobility, Decreased activity tolerance, Decreased strength, Pain, Difficulty walking, Decreased balance, Decreased range of motion, Improper body mechanics, Postural dysfunction, Impaired flexibility  Visit Diagnosis: Pain in left  hip  Stiffness of left hip, not elsewhere classified  Muscle weakness (generalized)  Difficulty in walking, not elsewhere classified     Problem List Patient Active Problem List   Diagnosis Date Noted  . S/P  total knee arthroplasty   . HTN (hypertension)   . Osteoarthritis   . PVC (premature ventricular contraction) 03/13/2013  . Other specified cardiac dysrhythmias(427.89) 03/13/2013    Percival Spanish, PT, MPT 01/31/2018, 2:00 PM  [facility] [address]  [place], [place], [address] Phone: [phone]   Fax:  [phone]  Name: Carrina Schoenberger MRN: [id number] Date of Birth: 11/15/1940

## 2018-02-02 ENCOUNTER — Ambulatory Visit: Payer: Medicare Other

## 2018-02-02 DIAGNOSIS — M25652 Stiffness of left hip, not elsewhere classified: Secondary | ICD-10-CM | POA: Diagnosis not present

## 2018-02-02 DIAGNOSIS — R262 Difficulty in walking, not elsewhere classified: Secondary | ICD-10-CM | POA: Diagnosis not present

## 2018-02-02 DIAGNOSIS — M6281 Muscle weakness (generalized): Secondary | ICD-10-CM | POA: Diagnosis not present

## 2018-02-02 DIAGNOSIS — M25552 Pain in left hip: Secondary | ICD-10-CM

## 2018-02-02 NOTE — Therapy (Addendum)
[facility] [address]  [place], [place], [address] Phone: [phone]   Fax:  [phone]  Physical Therapy Treatment  Patient Details  Name: Maria Rowland MRN: [id number] Date of Birth: 04-Apr-1941 Referring Provider: Rod Can, MD   Encounter Date: 02/02/2018  PT End of Session - 02/02/18 1449    Visit Number  8    Number of Visits  13    Date for PT Re-Evaluation  02/20/18    Authorization Type  Medicare    PT Start Time  1445    PT Stop Time  1523    PT Time Calculation (min)  38 min    Activity Tolerance  Patient tolerated treatment well    Behavior During Therapy  [facility] for tasks assessed/performed       Past Medical History:  Diagnosis Date  . Constipation   . HTN (hypertension)   . Irregular heart beat   . Osteoarthritis   . S/P total knee arthroplasty   . Shortness of breath    when she has palpitations    Past Surgical History:  Procedure Laterality Date  . APPENDECTOMY    . CARPAL TUNNEL RELEASE    . CHOLECYSTECTOMY    . COLONOSCOPY    . EYE SURGERY Right    cataract with lens implant  . hip replacement 2011    . surgical repair of left hand    . TONSILLECTOMY    . TOTAL KNEE ARTHROPLASTY Right 04/09/2013   Procedure: TOTAL KNEE ARTHROPLASTY;  Surgeon: Vickey Huger, MD;  Location: [facility];  Service: Orthopedics;  Laterality: Right;    There were no vitals filed for this visit.  Subjective Assessment - 02/02/18 1447    Subjective  Pt. reporting she is pain free today however had some pain yesterday due to "running around all day" running errands for son.      Pertinent History  SOB, s/p TKA, OA, irregular heart beat, HTN, constipation, surgical repair of L hand, hip replacement 2011, carpal tunnel release    Diagnostic tests  per pt- had xray on L hip and was clear    Patient Stated Goals  lose 10 lbs per MD recommendation and increase walking    Currently in Pain?  No/denies    Pain Score  0-No pain    Multiple Pain Sites  No                       [facility] Adult PT Treatment/Exercise - 02/02/18 1451      Knee/Hip Exercises: Aerobic   Recumbent Bike  L3 x 7 min      Knee/Hip Exercises: Standing   Hip Flexion  Left;Right;10 reps;Knee straight;Stengthening    Hip Flexion Limitations  yellow TB at ankle; 1 pole A    Hip ADduction  Left;Right;10 reps;Strengthening    Hip ADduction Limitations  yellow TB at ankle; 1 pole A    Hip Abduction  Left;Right;10 reps;Stengthening    Abduction Limitations  yellow TB at ankle; 1 pole A    Hip Extension  Left;Right;10 reps;Knee straight;Stengthening    Extension Limitations  yellow TB at ankle; 1 pole A    Forward Step Up  Right;Left;10 reps;Step Height: 8";Hand Hold: 1    Forward Step Up Limitations  counter       Knee/Hip Exercises: Supine   Bridges with Clamshell  Both   x 12 with isometric hip abd/ER into green TB at  knees    Other Supine Knee/Hip Exercises  Hooklying altenating hip abd/ER with green TB x 10 reps each LE             PT Education - 02/02/18 1736    Education Details  4-way hip kicker with yellow TB issued to pt.     Person(s) Educated  Patient    Methods  Explanation;Demonstration;Verbal cues;Handout    Comprehension  Verbalized understanding;Returned demonstration;Verbal cues required;Need further instruction       PT Short Term Goals - 01/24/18 1154      PT SHORT TERM GOAL #1   Title  Patient to be independent with initial HEP.    Time  3    Period  Weeks    Status  Achieved        PT Long Term Goals - 01/10/18 1540      PT LONG TERM GOAL #1   Title  Patient to be independent with advanced HEP.    Time  6    Period  Weeks    Status  On-going      PT LONG TERM GOAL #2   Title  Patient to demonstrate symmetrical and pain free L hip AROM to opposite side.    Time  6    Period  Weeks    Status  On-going      PT LONG TERM GOAL #3   Title  Patient to  demonstrate >=4+/5 strength in B LEs.    Time  6    Period  Weeks    Status  On-going      PT LONG TERM GOAL #4   Title  Patient to demonstrate reciprocal stair climbing up/down 13 steps with 1 handrail without pain limiting and good mechanics.    Time  6    Period  Weeks    Status  On-going      PT LONG TERM GOAL #5   Title  Patient to report tolerance of 20 min of walking without pain limiting.     Time  6    Period  Weeks    Status  On-going            Plan - 02/02/18 1449    Clinical Impression Statement  Pt. reports her hip is pain free today and notes ~ 80% improvement in pain levels since beginning therapy.  Kristia tolerated progression of SLS 4-way hip kicker well today with less UE support.  HEP updated to include 4-way hip kicker for continued proximal hip strengthening.  Will monitor response to updated HEP in coming visits.       PT Treatment/Interventions  ADLs/Self Care Home Management;Cryotherapy;Electrical Stimulation;Iontophoresis 4mg /ml Dexamethasone;Moist Heat;Therapeutic exercise;Therapeutic activities;Functional mobility training;Stair training;Gait training;DME Instruction;Ultrasound;Balance training;Neuromuscular re-education;Patient/family education;Manual techniques;Taping;Splinting;Energy conservation;Dry needling;Passive range of motion;Scar mobilization    Consulted and Agree with Plan of Care  Patient       Patient will benefit from skilled therapeutic intervention in order to improve the following deficits and impairments:  Hypomobility, Decreased activity tolerance, Decreased strength, Pain, Difficulty walking, Decreased balance, Decreased range of motion, Improper body mechanics, Postural dysfunction, Impaired flexibility  Visit Diagnosis: Pain in left hip  Stiffness of left hip, not elsewhere classified  Muscle weakness (generalized)  Difficulty in walking, not elsewhere classified     Problem List Patient Active Problem List    Diagnosis Date Noted  . S/P total knee arthroplasty   . HTN (hypertension)   . Osteoarthritis   . PVC (premature ventricular contraction)  03/13/2013  . Other specified cardiac dysrhythmias(427.89) 03/13/2013    Bess Harvest, PTA 02/02/18 5:37 PM   [facility] [address]  [place], [place], [address] Phone: [phone]   Fax:  [phone]  Name: Sky Primo MRN: [id number] Date of Birth: [birth year]

## 2018-02-07 ENCOUNTER — Encounter: Payer: Self-pay | Admitting: Physical Therapy

## 2018-02-07 ENCOUNTER — Ambulatory Visit: Payer: Medicare Other | Admitting: Physical Therapy

## 2018-02-07 DIAGNOSIS — M25652 Stiffness of left hip, not elsewhere classified: Secondary | ICD-10-CM

## 2018-02-07 DIAGNOSIS — R262 Difficulty in walking, not elsewhere classified: Secondary | ICD-10-CM | POA: Diagnosis not present

## 2018-02-07 DIAGNOSIS — M25552 Pain in left hip: Secondary | ICD-10-CM | POA: Diagnosis not present

## 2018-02-07 DIAGNOSIS — M6281 Muscle weakness (generalized): Secondary | ICD-10-CM | POA: Diagnosis not present

## 2018-02-07 NOTE — Therapy (Signed)
[facility] [address]  [place], [place], [address] Phone: [phone]   Fax:  [phone]  Physical Therapy Progress Note  Patient Details  Name: Maria Rowland MRN: [id number] Date of Birth: 1941/05/13 Referring Provider: Rod Can, MD   Progress Note Reporting Period 01/09/18 to 02/07/18  See note below for Objective Data and Assessment of Progress/Goals.    Encounter Date: 02/07/2018  PT End of Session - 02/07/18 1151    Visit Number  9    Number of Visits  17    Date for PT Re-Evaluation  03/07/18    Authorization Type  Medicare    PT Start Time  1014    PT Stop Time  1106   moist heat   PT Time Calculation (min)  52 min    Equipment Utilized During Treatment  Gait belt    Activity Tolerance  Patient tolerated treatment well;Patient limited by pain    Behavior During Therapy  WFL for tasks assessed/performed       Past Medical History:  Diagnosis Date  . Constipation   . HTN (hypertension)   . Irregular heart beat   . Osteoarthritis   . S/P total knee arthroplasty   . Shortness of breath    when she has palpitations    Past Surgical History:  Procedure Laterality Date  . APPENDECTOMY    . CARPAL TUNNEL RELEASE    . CHOLECYSTECTOMY    . COLONOSCOPY    . EYE SURGERY Right    cataract with lens implant  . hip replacement 2011    . surgical repair of left hand    . TONSILLECTOMY    . TOTAL KNEE ARTHROPLASTY Right 04/09/2013   Procedure: TOTAL KNEE ARTHROPLASTY;  Surgeon: Vickey Huger, MD;  Location: [place];  Service: Orthopedics;  Laterality: Right;    There were no vitals filed for this visit.  Subjective Assessment - 02/07/18 1015    Subjective  Reports she had been having a lot of pain over the weekend and not having a good day today. Has been compliant with HEP except for Sunday. Did step ups on Saturday but can not recall whether she had pain before or after that. Also having flare  up in neck pain. Reports she was almost ready to discharge at last appointment, however having flare up of pain today.     Pertinent History  SOB, s/p TKA, OA, irregular heart beat, HTN, constipation, surgical repair of L hand, hip replacement 2011, carpal tunnel release    Diagnostic tests  per pt- had xray on L hip and was clear    Patient Stated Goals  lose 10 lbs per MD recommendation and increase walking    Currently in Pain?  Yes    Pain Score  5     Pain Location  Hip    Pain Descriptors / Indicators  Aching    Pain Type  Chronic pain         [facility] PT Assessment - 02/07/18 0001      Assessment   Medical Diagnosis  Trochanteric bursitis of L hip    Referring Provider  Rod Can, MD    Onset Date/Surgical Date  09/09/17      Observation/Other Assessments   Focus on Therapeutic Outcomes (FOTO)   Hip: 42 (58% limited, 45% predicted)      AROM   AROM Assessment Site  Hip    Right/Left Hip  Left  Left Hip Flexion  100    Left Hip External Rotation   25    Left Hip Internal Rotation   27      Strength   Strength Assessment Site  Hip;Knee;Ankle    Right/Left Hip  Right;Left    Right Hip Flexion  4-/5    Right Hip ABduction  4/5    Right Hip ADduction  4/5    Left Hip ABduction  4/5    Left Hip ADduction  4/5    Right Knee Flexion  4+/5    Right Knee Extension  4+/5    Left Knee Flexion  4/5   pain in L lateral hip   Left Knee Extension  4+/5    Right Ankle Dorsiflexion  4+/5    Right Ankle Plantar Flexion  4+/5    Left Ankle Dorsiflexion  4+/5    Left Ankle Plantar Flexion  4+/5      Ambulation/Gait   Stairs  Yes    Stairs Assistance  6: Modified independent (Device/Increase time)    Stair Management Technique  One rail Right    Number of Stairs  13    Height of Stairs  8    Gait Comments  patient able to reciprocally climb stairs with 1 HR on right with c/o L hip pain and mild quad instability on descending, heavy handrail use on ascending                    [facility] Adult PT Treatment/Exercise - 02/07/18 0001      Knee/Hip Exercises: Stretches   Passive Hamstring Stretch  Left;30 seconds;2 reps    Passive Hamstring Stretch Limitations  supine strap    Hip Flexor Stretch  Left;30 seconds;2 reps    Hip Flexor Stretch Limitations  prone with strap     Piriformis Stretch  Left;2 reps;30 seconds;Limitations    Piriformis Stretch Limitations  KTOS with manual guidance      Knee/Hip Exercises: Aerobic   Recumbent Bike  L3 x 7 min      Modalities   Modalities  Moist Heat      Moist Heat Therapy   Number Minutes Moist Heat  10 Minutes    Moist Heat Location  Hip   draped over L hip in sidelying     Manual Therapy   Manual Therapy  Soft tissue mobilization;Myofascial release    Manual therapy comments  sidelying    Soft tissue mobilization  STM to L TFL, lateral HS, lateral glute, piriformis- very tender along these areas    Myofascial Release  TPR to L piriformis, HS, glute    Passive ROM  L manual TFL stretch in sidelying 2x30"               PT Short Term Goals - 02/07/18 1158      PT SHORT TERM GOAL #1   Title  Patient to be independent with initial HEP.    Time  3    Period  Weeks    Status  Achieved        PT Long Term Goals - 02/07/18 1024      PT LONG TERM GOAL #1   Title  Patient to be independent with advanced HEP.    Time  4    Period  Weeks    Status  Partially Met   met for current   Target Date  03/07/18      PT LONG TERM GOAL #2  Title  Patient to demonstrate symmetrical and pain free L hip AROM to opposite side.    Time  4    Period  Weeks    Status  Partially Met   showing improvement in L hip ER, still most limited in ER   Target Date  03/07/18      PT LONG TERM GOAL #3   Title  Patient to demonstrate >=4+/5 strength in B LEs.    Time  4    Period  Weeks    Status  Partially Met   demonstrating improvements in B hip abduction, B knee extension, R knee flexion, and  L ankle DF strength; still most limited in proximal hip strength   Target Date  03/07/18      PT LONG TERM GOAL #4   Title  Patient to demonstrate reciprocal stair climbing up/down 13 steps with 1 handrail without pain limiting and good mechanics.    Time  4    Period  Weeks    Status  On-going   still with mild quad instability when descending and heavy rail use when ascending with reciprocal pattern   Target Date  03/07/18      PT LONG TERM GOAL #5   Title  Patient to report tolerance of 20 min of walking without pain limiting.     Time  4    Period  Weeks    Status  On-going   reports 3 min of walking before limited by pain   Target Date  03/07/18            Plan - 02/07/18 1152    Clinical Impression Statement  Patient arrived to session with report of pain flare up in L hip over the weekend without definite cause. Reports that prior to this flare up, patient was nearly ready for transition home program, however feels that she has taken a big step back despite being compliant with HEP. Updated goals- patient showing improvement in L hip ER AROM, still most limited in ER. Demonstrating improvements in B hip abduction, B knee extension, R knee flexion, and L ankle DF strength; still most limited in proximal hip strength. Still with mild quad instability when descending stairs and heavy rail use when ascending with reciprocal pattern. Patient limited to 3 minutes of walking at this time before being limited by pain. Focused session on STM to L TFL, glute, piriformis, and lateral HS as patient with significant tenderness and soft tissue restriction in these areas today. Followed up with LE stretching and ended with moist heat to L hip. Patient reporting improvement in symptoms at end of session. Patient showing improvements in impairments thus far, would benefit from additional skilled PT services 2x/week for 4 weeks to address remaining impairments as they are limiting her from daily  functional activities.     PT Frequency  2x / week    PT Duration  4 weeks    PT Treatment/Interventions  ADLs/Self Care Home Management;Cryotherapy;Electrical Stimulation;Iontophoresis 79m/ml Dexamethasone;Moist Heat;Therapeutic exercise;Therapeutic activities;Functional mobility training;Stair training;Gait training;DME Instruction;Ultrasound;Balance training;Neuromuscular re-education;Patient/family education;Manual techniques;Taping;Splinting;Energy conservation;Dry needling;Passive range of motion;Scar mobilization    Consulted and Agree with Plan of Care  Patient       Patient will benefit from skilled therapeutic intervention in order to improve the following deficits and impairments:  Hypomobility, Decreased activity tolerance, Decreased strength, Pain, Difficulty walking, Decreased balance, Decreased range of motion, Improper body mechanics, Postural dysfunction, Impaired flexibility  Visit Diagnosis: Pain in left hip  Stiffness of  left hip, not elsewhere classified  Muscle weakness (generalized)  Difficulty in walking, not elsewhere classified     Problem List Patient Active Problem List   Diagnosis Date Noted  . S/P total knee arthroplasty   . HTN (hypertension)   . Osteoarthritis   . PVC (premature ventricular contraction) 03/13/2013  . Other specified cardiac dysrhythmias(427.89) 03/13/2013     Janene Harvey, PT, DPT 02/07/18 12:04 PM   [facility] [address]  [place], [place], [address] Phone: [phone]   Fax:  [phone]  Name: Maria Rowland MRN: [id number] Date of Birth: [birth year]

## 2018-02-10 ENCOUNTER — Ambulatory Visit: Payer: Medicare Other

## 2018-02-10 DIAGNOSIS — M25552 Pain in left hip: Secondary | ICD-10-CM

## 2018-02-10 DIAGNOSIS — M25652 Stiffness of left hip, not elsewhere classified: Secondary | ICD-10-CM | POA: Diagnosis not present

## 2018-02-10 DIAGNOSIS — M6281 Muscle weakness (generalized): Secondary | ICD-10-CM | POA: Diagnosis not present

## 2018-02-10 DIAGNOSIS — R262 Difficulty in walking, not elsewhere classified: Secondary | ICD-10-CM | POA: Diagnosis not present

## 2018-02-10 NOTE — Therapy (Signed)
[facility] [address]  [place], [place], [address] Phone: [phone]   Fax:  [phone]  Physical Therapy Treatment  Patient Details  Name: Maria Rowland MRN: [id number] Date of Birth: 02-28-1941 Referring Provider: Rod Can, MD   Encounter Date: 02/10/2018  PT End of Session - 02/10/18 1024    Visit Number  10    Number of Visits  17    Date for PT Re-Evaluation  03/07/18    Authorization Type  Medicare    PT Start Time  1020    PT Stop Time  1058    PT Time Calculation (min)  38 min    Equipment Utilized During Treatment  --    Activity Tolerance  Patient tolerated treatment well;Patient limited by pain    Behavior During Therapy  [facility] for tasks assessed/performed       Past Medical History:  Diagnosis Date  . Constipation   . HTN (hypertension)   . Irregular heart beat   . Osteoarthritis   . S/P total knee arthroplasty   . Shortness of breath    when she has palpitations    Past Surgical History:  Procedure Laterality Date  . APPENDECTOMY    . CARPAL TUNNEL RELEASE    . CHOLECYSTECTOMY    . COLONOSCOPY    . EYE SURGERY Right    cataract with lens implant  . hip replacement 2011    . surgical repair of left hand    . TONSILLECTOMY    . TOTAL KNEE ARTHROPLASTY Right 04/09/2013   Procedure: TOTAL KNEE ARTHROPLASTY;  Surgeon: Vickey Huger, MD;  Location: [facility];  Service: Orthopedics;  Laterality: Right;    There were no vitals filed for this visit.  Subjective Assessment - 02/10/18 1022    Subjective  Maria Rowland reports good pain relief following last session.      Pertinent History  SOB, s/p TKA, OA, irregular heart beat, HTN, constipation, surgical repair of L hand, hip replacement 2011, carpal tunnel release    Diagnostic tests  per pt- had xray on L hip and was clear    Patient Stated Goals  lose 10 lbs per MD recommendation and increase walking    Currently in Pain?  Yes    Pain  Score  1     Pain Location  Hip    Pain Orientation  Left    Pain Descriptors / Indicators  Aching    Pain Type  Chronic pain    Multiple Pain Sites  No                       [facility] Adult PT Treatment/Exercise - 02/10/18 1041      Knee/Hip Exercises: Stretches   Passive Hamstring Stretch  Left;30 seconds;2 reps    Passive Hamstring Stretch Limitations  supine strap    Hip Flexor Stretch  Left;30 seconds;2 reps    Hip Flexor Stretch Limitations  prone with strap     ITB Stretch  Left;30 seconds;Limitations;2 reps    ITB Stretch Limitations  strap     Piriformis Stretch  Left;2 reps;30 seconds;Limitations    Piriformis Stretch Limitations  KTOS with manual guidance      Knee/Hip Exercises: Aerobic   Recumbent Bike  L2 x 7 min      Knee/Hip Exercises: Seated   Clamshell with TheraBand  Red   Alternating clam shell x 12 reps    Sit to  Sand  15 reps;with UE support   with red TB at knees and hip abd/ER     Knee/Hip Exercises: Sidelying   Clams  L clam shell with yellow TB x 12      Moist Heat Therapy   Number Minutes Moist Heat  10 Minutes    Moist Heat Location  Hip   R hip and R mid thighs     Manual Therapy   Manual Therapy  Soft tissue mobilization;Myofascial release    Manual therapy comments  sidelying    Soft tissue mobilization  STM to lateral HS, lateral quad; ttp in lateral quad along mid and distal VL/ITB    Myofascial Release  TPR to L lateral quads mid and distal                PT Short Term Goals - 02/07/18 1158      PT SHORT TERM GOAL #1   Title  Patient to be independent with initial HEP.    Time  3    Period  Weeks    Status  Achieved        PT Long Term Goals - 02/07/18 1024      PT LONG TERM GOAL #1   Title  Patient to be independent with advanced HEP.    Time  4    Period  Weeks    Status  Partially Met   met for current   Target Date  03/07/18      PT LONG TERM GOAL #2   Title  Patient to demonstrate symmetrical  and pain free L hip AROM to opposite side.    Time  4    Period  Weeks    Status  Partially Met   showing improvement in L hip ER, still most limited in ER   Target Date  03/07/18      PT LONG TERM GOAL #3   Title  Patient to demonstrate >=4+/5 strength in B LEs.    Time  4    Period  Weeks    Status  Partially Met   demonstrating improvements in B hip abduction, B knee extension, R knee flexion, and L ankle DF strength; still most limited in proximal hip strength   Target Date  03/07/18      PT LONG TERM GOAL #4   Title  Patient to demonstrate reciprocal stair climbing up/down 13 steps with 1 handrail without pain limiting and good mechanics.    Time  4    Period  Weeks    Status  On-going   still with mild quad instability when descending and heavy rail use when ascending with reciprocal pattern   Target Date  03/07/18      PT LONG TERM GOAL #5   Title  Patient to report tolerance of 20 min of walking without pain limiting.     Time  4    Period  Weeks    Status  On-going   reports 3 min of walking before limited by pain   Target Date  03/07/18            Plan - 02/10/18 1025    Clinical Impression Statement  Maria Rowland reporting good relief from painful "flare-up" last visit.  Tolerated further manual therapy to tenderness in L lateral thigh musculature well and twitch response to multiple TP's today with TPR.  Ended visit with moist heat to L lateral thigh for hopeful reduction in tone.  Progressing well.  PT Treatment/Interventions  ADLs/Self Care Home Management;Cryotherapy;Electrical Stimulation;Iontophoresis 28m/ml Dexamethasone;Moist Heat;Therapeutic exercise;Therapeutic activities;Functional mobility training;Stair training;Gait training;DME Instruction;Ultrasound;Balance training;Neuromuscular re-education;Patient/family education;Manual techniques;Taping;Splinting;Energy conservation;Dry needling;Passive range of motion;Scar mobilization    Consulted and Agree  with Plan of Care  Patient       Patient will benefit from skilled therapeutic intervention in order to improve the following deficits and impairments:  Hypomobility, Decreased activity tolerance, Decreased strength, Pain, Difficulty walking, Decreased balance, Decreased range of motion, Improper body mechanics, Postural dysfunction, Impaired flexibility  Visit Diagnosis: Pain in left hip  Stiffness of left hip, not elsewhere classified  Muscle weakness (generalized)  Difficulty in walking, not elsewhere classified     Problem List Patient Active Problem List   Diagnosis Date Noted  . S/P total knee arthroplasty   . HTN (hypertension)   . Osteoarthritis   . PVC (premature ventricular contraction) 03/13/2013  . Other specified cardiac dysrhythmias(427.89) 03/13/2013    MBess Harvest PTA 02/10/18 12:11 PM   [facility] [address] [place] [address]Phone: [phone]  Fax:  [phone] Name: Maria HanzlikMRN: [id number] of Birth: 03/18/1941-02-21

## 2018-02-14 DIAGNOSIS — M7062 Trochanteric bursitis, left hip: Secondary | ICD-10-CM | POA: Diagnosis not present

## 2018-02-15 ENCOUNTER — Encounter: Payer: Self-pay | Admitting: Physical Therapy

## 2018-02-15 ENCOUNTER — Ambulatory Visit: Payer: Medicare Other | Admitting: Physical Therapy

## 2018-02-15 DIAGNOSIS — M25652 Stiffness of left hip, not elsewhere classified: Secondary | ICD-10-CM | POA: Diagnosis not present

## 2018-02-15 DIAGNOSIS — M6281 Muscle weakness (generalized): Secondary | ICD-10-CM | POA: Diagnosis not present

## 2018-02-15 DIAGNOSIS — R262 Difficulty in walking, not elsewhere classified: Secondary | ICD-10-CM | POA: Diagnosis not present

## 2018-02-15 DIAGNOSIS — M25552 Pain in left hip: Secondary | ICD-10-CM

## 2018-02-15 NOTE — Therapy (Signed)
[facility] [address]  [place], [place], [address] Phone: [phone]   Fax:  [phone]  Physical Therapy Treatment  Patient Details  Name: Maria Rowland MRN: [id number] Date of Birth: Jan 25, 1941 Referring Provider: Rod Can, MD   Encounter Date: 02/15/2018  PT End of Session - 02/15/18 1010    Visit Number  11    Number of Visits  17    Date for PT Re-Evaluation  03/07/18    Authorization Type  Medicare    PT Start Time  0928    PT Stop Time  1008    PT Time Calculation (min)  40 min    Activity Tolerance  Patient tolerated treatment well    Behavior During Therapy  [facility] for tasks assessed/performed       Past Medical History:  Diagnosis Date  . Constipation   . HTN (hypertension)   . Irregular heart beat   . Osteoarthritis   . S/P total knee arthroplasty   . Shortness of breath    when she has palpitations    Past Surgical History:  Procedure Laterality Date  . APPENDECTOMY    . CARPAL TUNNEL RELEASE    . CHOLECYSTECTOMY    . COLONOSCOPY    . EYE SURGERY Right    cataract with lens implant  . hip replacement 2011    . surgical repair of left hand    . TONSILLECTOMY    . TOTAL KNEE ARTHROPLASTY Right 04/09/2013   Procedure: TOTAL KNEE ARTHROPLASTY;  Surgeon: Vickey Huger, MD;  Location: [facility];  Service: Orthopedics;  Laterality: Right;    There were no vitals filed for this visit.  Subjective Assessment - 02/15/18 0930    Subjective  Reports that she went to MD who advised her to continue PT. Patient declined steroid injection. Reports she is not sure that her tolerance for prolonged walking has improved. However, notes improvements in felxibility and strength when wlaking up stairs.     Pertinent History  SOB, s/p TKA, OA, irregular heart beat, HTN, constipation, surgical repair of L hand, hip replacement 2011, carpal tunnel release    Diagnostic tests  per pt- had xray on L hip and was  clear    Patient Stated Goals  lose 10 lbs per MD recommendation and increase walking    Currently in Pain?  No/denies                       [facility] Adult PT Treatment/Exercise - 02/15/18 0001      Knee/Hip Exercises: Stretches   Piriformis Stretch  Left;2 reps;30 seconds;Limitations    Piriformis Stretch Limitations  KTOS     Other Knee/Hip Stretches  L fig 4 stretch 2x30" L LE   adjusted to avoid knee pain     Knee/Hip Exercises: Aerobic   Recumbent Bike  L3 x 7 min      Knee/Hip Exercises: Standing   Hip Flexion  Left;Right;10 reps;Knee straight;Stengthening    Hip Flexion Limitations  red TB around ankle; 2 ski poles    Hip ADduction  Left;10 reps;Strengthening;1 set;Limitations    Hip ADduction Limitations  red TB around ankle; 2 ski poles    Hip Abduction  Left;Right;10 reps;Stengthening   cues to avoid anterior and lateral trunk lean   Abduction Limitations  red TB around ankle; 2 ski poles    Hip Extension  Left;10 reps;Knee straight;Stengthening   cues to avoid ant  trunk lean   Extension Limitations  red TB around ankle; 2 ski poles    Forward Step Up  Right;Left;10 reps;Step Height: 8";Hand Hold: 1   c/o temporary L hip pain after activity   Forward Step Up Limitations  R UE counter top support; cues for slow eccentric lower      Knee/Hip Exercises: Seated   Sit to Sand  1 set;15 reps;without UE support   STS with bottom touch on foam pad; cues to keep bottom back     Knee/Hip Exercises: Supine   Bridges  Both;10 reps;Strengthening;Limitations    Bridges Limitations  bridge + alt march; cues t avoid hips dropping      Manual Therapy   Manual Therapy  Soft tissue mobilization;Myofascial release    Manual therapy comments  sidelying    Soft tissue mobilization  STM to L piriformis, superior glute, lateral HS and TFL    Myofascial Release  TPR to L superior glute and TFL/lateral HS    Passive ROM  L manual TFL stretch in sidelying 2x30"              PT Education - 02/15/18 1009    Education Details  administered red TB for 4 way hip at home    Person(s) Educated  Patient    Methods  Explanation;Demonstration;Tactile cues;Verbal cues    Comprehension  Returned demonstration;Verbalized understanding       PT Short Term Goals - 02/07/18 1158      PT SHORT TERM GOAL #1   Title  Patient to be independent with initial HEP.    Time  3    Period  Weeks    Status  Achieved        PT Long Term Goals - 02/07/18 1024      PT LONG TERM GOAL #1   Title  Patient to be independent with advanced HEP.    Time  4    Period  Weeks    Status  Partially Met   met for current   Target Date  03/07/18      PT LONG TERM GOAL #2   Title  Patient to demonstrate symmetrical and pain free L hip AROM to opposite side.    Time  4    Period  Weeks    Status  Partially Met   showing improvement in L hip ER, still most limited in ER   Target Date  03/07/18      PT LONG TERM GOAL #3   Title  Patient to demonstrate >=4+/5 strength in B LEs.    Time  4    Period  Weeks    Status  Partially Met   demonstrating improvements in B hip abduction, B knee extension, R knee flexion, and L ankle DF strength; still most limited in proximal hip strength   Target Date  03/07/18      PT LONG TERM GOAL #4   Title  Patient to demonstrate reciprocal stair climbing up/down 13 steps with 1 handrail without pain limiting and good mechanics.    Time  4    Period  Weeks    Status  On-going   still with mild quad instability when descending and heavy rail use when ascending with reciprocal pattern   Target Date  03/07/18      PT LONG TERM GOAL #5   Title  Patient to report tolerance of 20 min of walking without pain limiting.     Time  4  Period  Weeks    Status  On-going   reports 3 min of walking before limited by pain   Target Date  03/07/18            Plan - 02/15/18 1010    Clinical Impression Statement  Patient arrived to  session with report of recent MD appointment- MD requesting to continue with PT. Patient noting great improvements in flexibility, strength, and ability to ascend/descend stairs, however walking tolerance has not improved much. Patient tolerated STM to L buttock and LE- soft tissue restriction and trigger pts noted in L glute, piriformis, TFL/lateral HS. Patient with good tolerance of progressive LE strengthening and stretching with cues required to modify stretches to reduce pain. Good performance of anterior step ups- mild c/o temporary pain in L hip after this activity. Reviewed 4 way hip and progressed banded resistance during session. Patient reported understanding. No c/o pain at end of session. Patient reporting she feels comfortable continuing HEP at home and plans to join a gym. Would like to be D/C'd next session.    PT Treatment/Interventions  ADLs/Self Care Home Management;Cryotherapy;Electrical Stimulation;Iontophoresis 81m/ml Dexamethasone;Moist Heat;Therapeutic exercise;Therapeutic activities;Functional mobility training;Stair training;Gait training;DME Instruction;Ultrasound;Balance training;Neuromuscular re-education;Patient/family education;Manual techniques;Taping;Splinting;Energy conservation;Dry needling;Passive range of motion;Scar mobilization    PT Next Visit Plan  D/C next session- review of HEP and gym equipment    Consulted and Agree with Plan of Care  Patient       Patient will benefit from skilled therapeutic intervention in order to improve the following deficits and impairments:  Hypomobility, Decreased activity tolerance, Decreased strength, Pain, Difficulty walking, Decreased balance, Decreased range of motion, Improper body mechanics, Postural dysfunction, Impaired flexibility  Visit Diagnosis: Pain in left hip  Stiffness of left hip, not elsewhere classified  Muscle weakness (generalized)  Difficulty in walking, not elsewhere classified     Problem  List Patient Active Problem List   Diagnosis Date Noted  . S/P total knee arthroplasty   . HTN (hypertension)   . Osteoarthritis   . PVC (premature ventricular contraction) 03/13/2013  . Other specified cardiac dysrhythmias(427.89) 03/13/2013    YJanene Harvey PT, DPT 02/15/18 11:50 AM   [facility] [place] [address]Phone: [phone]  Fax:  [phone] Name: Maria AguinaldoMRN: [id number] of Birth: [birth year]

## 2018-02-17 ENCOUNTER — Ambulatory Visit: Payer: Medicare Other | Admitting: Physical Therapy

## 2018-02-17 ENCOUNTER — Encounter: Payer: Self-pay | Admitting: Physical Therapy

## 2018-02-17 DIAGNOSIS — M6281 Muscle weakness (generalized): Secondary | ICD-10-CM

## 2018-02-17 DIAGNOSIS — M25652 Stiffness of left hip, not elsewhere classified: Secondary | ICD-10-CM

## 2018-02-17 DIAGNOSIS — M25552 Pain in left hip: Secondary | ICD-10-CM

## 2018-02-17 DIAGNOSIS — R262 Difficulty in walking, not elsewhere classified: Secondary | ICD-10-CM

## 2018-02-17 NOTE — Therapy (Addendum)
Maria Rowland [address]  [place], [place], [address] Phone: [phone]   Fax:  [phone]  Physical Therapy Treatment  Patient Details  Name: Lamica Rowland MRN: [id number] Date of Birth: 06-21-1940 Referring Provider (PT): Maria Can, MD   Progress Note Reporting Period 02/15/18 to 02/17/18  See note below for Objective Data and Assessment of Progress/Goals.    Encounter Date: 02/17/2018  PT End of Session - 02/17/18 1123    Visit Number  12    Number of Visits  17    Date for PT Re-Evaluation  03/07/18    Authorization Type  Medicare    PT Start Time  0847    PT Stop Time  0929    PT Time Calculation (min)  42 min    Activity Tolerance  Patient tolerated treatment well    Behavior During Therapy  [facility] for tasks assessed/performed       Past Medical History:  Diagnosis Date  . Constipation   . HTN (hypertension)   . Irregular heart beat   . Osteoarthritis   . S/P total knee arthroplasty   . Shortness of breath    when she has palpitations    Past Surgical History:  Procedure Laterality Date  . APPENDECTOMY    . CARPAL TUNNEL RELEASE    . CHOLECYSTECTOMY    . COLONOSCOPY    . EYE SURGERY Right    cataract with lens implant  . hip replacement 2011    . surgical repair of left hand    . TONSILLECTOMY    . TOTAL KNEE ARTHROPLASTY Right 04/09/2013   Procedure: TOTAL KNEE ARTHROPLASTY;  Surgeon: Vickey Huger, MD;  Location: [facility];  Service: Orthopedics;  Laterality: Right;    There were no vitals filed for this visit.  Subjective Assessment - 02/17/18 0847    Subjective  Reports she is doing well and would like to wrap up today. Reports 50% improvement since inital eval. Notes improvements in ability to go up stairs, strength, flexibility. Still limited in prolonged walking.     Pertinent History  SOB, s/p TKA, OA, irregular heart beat, HTN, constipation, surgical repair of L hand, hip  replacement 2011, carpal tunnel release    Diagnostic tests  per pt- had xray on L hip and was clear    Patient Stated Goals  lose 10 lbs per MD recommendation and increase walking    Currently in Pain?  No/denies         [facility] PT Assessment - 02/17/18 0001      Observation/Other Assessments   Focus on Therapeutic Outcomes (FOTO)   Hip: 55 (45% limited, 45% predicted)      AROM   AROM Assessment Site  Hip    Right/Left Hip  Left    Left Hip Flexion  100    Left Hip External Rotation   22    Left Hip Internal Rotation   18      Strength   Right/Left Hip  Right;Left    Right Hip Flexion  4/5    Right Hip ABduction  4+/5    Right Hip ADduction  4+/5    Left Hip Flexion  4/5    Left Hip ABduction  4+/5    Left Hip ADduction  4+/5    Right Knee Flexion  5/5    Right Knee Extension  5/5    Left Knee Flexion  4+/5    Left Knee Extension  4+/5    Right Ankle Dorsiflexion  4+/5    Right Ankle Plantar Flexion  4+/5    Left Ankle Dorsiflexion  4+/5    Left Ankle Plantar Flexion  4+/5                   [facility] Adult PT Treatment/Exercise - 02/17/18 0001      Ambulation/Gait   Stairs  Yes    Stairs Assistance  6: Modified independent (Device/Increase time)    Stair Management Technique  One rail Right    Number of Stairs  13    Height of Stairs  8    Gait Comments  Able to ascend/descend reciprocally with 1 hand rail- moderate quad instability noted on L LE while descending      Exercises   Exercises  Knee/Hip      Knee/Hip Exercises: Stretches   Piriformis Stretch  Left;2 reps;30 seconds;Limitations    Piriformis Stretch Limitations  KTOS       Knee/Hip Exercises: Machines for Strengthening   Cybex Knee Extension  B LEs 10x5#    Cybex Knee Flexion  B LEs 10x5#    Cybex Leg Press  B LEs 10x15#      Knee/Hip Exercises: Supine   Bridges  Both;10 reps;Strengthening;Limitations;2 sets    Bridges Limitations  red TB around knees             PT Education  - 02/17/18 1122    Education Details  update and consolidation of HEP program, administered gym machine HEP as well- advised to avoid pain and use counter top or chair support for all standing exercises    Person(s) Educated  Patient    Methods  Explanation;Demonstration;Tactile cues;Verbal cues;Handout    Comprehension  Verbalized understanding;Returned demonstration       PT Short Term Goals - 02/17/18 0853      PT SHORT TERM GOAL #1   Title  Patient to be independent with initial HEP.    Time  3    Period  Weeks    Status  Achieved        PT Long Term Goals - 02/17/18 6468      PT LONG TERM GOAL #1   Title  Patient to be independent with advanced HEP.    Time  4    Period  Weeks    Status  Achieved      PT LONG TERM GOAL #2   Title  Patient to demonstrate symmetrical and pain free L hip AROM to opposite side.    Time  4    Period  Weeks    Status  Partially Met   L hip AROM unchanged, however no pain noted     PT LONG TERM GOAL #3   Title  Patient to demonstrate >=4+/5 strength in B LEs.    Time  4    Period  Weeks    Status  Partially Met   demonstrated improvements in B hip flexion, ABD, ADD, B knee flexion, and R knee extension; most limited in B hip flexion     PT LONG TERM GOAL #4   Title  Patient to demonstrate reciprocal stair climbing up/down 13 steps with 1 handrail without pain limiting and good mechanics.    Time  4    Period  Weeks    Status  Partially Met   Able to ascend/descend reciprocally with 1 hand rail- moderate quad instability noted on L LE while descending  PT LONG TERM GOAL #5   Title  Patient to report tolerance of 20 min of walking without pain limiting.     Time  4    Period  Weeks    Status  Not Met   reports 1-2 min of walking before limited by pain           Plan - 02/17/18 1126    Clinical Impression Statement  Patient arrived to session with report of 50% improvement in L hip since initial eval. Notes improvements in  comfort when going up/down stairs, strength, and flexibility. Requests to be placed on 30 day hold today. Updated goals- patient has demonstrated improvements in B hip flexion, ABD, ADD, B knee flexion, and R knee extension; most limited in B hip flexion. Able to ascend/descend reciprocally with 1 hand rail- moderate quad instability noted on L LE while descending. Patient still limited in walking tolerance- noting 1-2 min of walking before L hip pain sets in. L hip AROM remains unchanged, however no pain with end range movements. Patient tolerated all measurements and ther-ex for LE strengthening today without issues. Spend significant amount of time reviewing/consolidating HEP and administered gym machine HEP based on exercises performed today as patient reports she would like to join and gym and use the machines there. All questions answered by end of session. Patient has shown good progress with PT- placed on 30 day hold at this time d/t satisfaction with CLOF.     PT Treatment/Interventions  ADLs/Self Care Home Management;Cryotherapy;Electrical Stimulation;Iontophoresis 48m/ml Dexamethasone;Moist Heat;Therapeutic exercise;Therapeutic activities;Functional mobility training;Stair training;Gait training;DME Instruction;Ultrasound;Balance training;Neuromuscular re-education;Patient/family education;Manual techniques;Taping;Splinting;Energy conservation;Dry needling;Passive range of motion;Scar mobilization    PT Next Visit Plan  30 day hold at this time    Consulted and Agree with Plan of Care  Patient       Patient will benefit from skilled therapeutic intervention in order to improve the following deficits and impairments:  Hypomobility, Decreased activity tolerance, Decreased strength, Pain, Difficulty walking, Decreased balance, Decreased range of motion, Improper body mechanics, Postural dysfunction, Impaired flexibility  Visit Diagnosis: Pain in left hip  Stiffness of left hip, not elsewhere  classified  Muscle weakness (generalized)  Difficulty in walking, not elsewhere classified     Problem List Patient Active Problem List   Diagnosis Date Noted  . S/P total knee arthroplasty   . HTN (hypertension)   . Osteoarthritis   . PVC (premature ventricular contraction) 03/13/2013  . Other specified cardiac dysrhythmias(427.89) 03/13/2013     YJanene Harvey PT, DPT 02/17/18 11:32 AM   [place] [place] [address] [place] [address]Phone: [phone]  Fax:  [phone] Name: Maria JhaveriMRN: [id number] of Birth: 07/21/1940/12/25 PHYSICAL THERAPY DISCHARGE SUMMARY  Visits from Start of Care: 12  Current functional level related to goals / functional outcomes: See above clinical summary; patient pleased with progress and has not returned since being placed on 30 day hold   Remaining deficits: Decreased ROM, strength, walking tolerance   Education / Equipment: HEP  Plan: Patient agrees to discharge.  Patient goals were partially met. Patient is being discharged due to being pleased with the current functional level.  ?????     YJanene Harvey PT, DPT 03/21/18 8:11 AM

## 2018-02-20 ENCOUNTER — Ambulatory Visit: Payer: Medicare Other | Admitting: Physical Therapy

## 2018-02-28 ENCOUNTER — Encounter: Payer: PRIVATE HEALTH INSURANCE | Admitting: Physical Therapy

## 2018-03-03 ENCOUNTER — Encounter: Payer: PRIVATE HEALTH INSURANCE | Admitting: Physical Therapy

## 2018-03-09 ENCOUNTER — Encounter: Payer: PRIVATE HEALTH INSURANCE | Admitting: Physical Therapy

## 2018-03-28 DIAGNOSIS — J302 Other seasonal allergic rhinitis: Secondary | ICD-10-CM | POA: Diagnosis not present

## 2018-03-28 DIAGNOSIS — Z780 Asymptomatic menopausal state: Secondary | ICD-10-CM | POA: Diagnosis not present

## 2018-03-28 DIAGNOSIS — M47812 Spondylosis without myelopathy or radiculopathy, cervical region: Secondary | ICD-10-CM | POA: Diagnosis not present

## 2018-03-28 DIAGNOSIS — M179 Osteoarthritis of knee, unspecified: Secondary | ICD-10-CM | POA: Diagnosis not present

## 2018-03-28 DIAGNOSIS — Z23 Encounter for immunization: Secondary | ICD-10-CM | POA: Diagnosis not present

## 2018-03-28 DIAGNOSIS — I10 Essential (primary) hypertension: Secondary | ICD-10-CM | POA: Diagnosis not present

## 2018-03-28 DIAGNOSIS — Z1389 Encounter for screening for other disorder: Secondary | ICD-10-CM | POA: Diagnosis not present

## 2018-03-28 DIAGNOSIS — M1610 Unilateral primary osteoarthritis, unspecified hip: Secondary | ICD-10-CM | POA: Diagnosis not present

## 2018-03-28 DIAGNOSIS — Z0000 Encounter for general adult medical examination without abnormal findings: Secondary | ICD-10-CM | POA: Diagnosis not present

## 2018-04-10 DIAGNOSIS — Z780 Asymptomatic menopausal state: Secondary | ICD-10-CM | POA: Diagnosis not present

## 2018-04-10 DIAGNOSIS — M8589 Other specified disorders of bone density and structure, multiple sites: Secondary | ICD-10-CM | POA: Diagnosis not present

## 2018-06-16 DIAGNOSIS — J111 Influenza due to unidentified influenza virus with other respiratory manifestations: Secondary | ICD-10-CM | POA: Diagnosis not present

## 2018-06-16 DIAGNOSIS — R05 Cough: Secondary | ICD-10-CM | POA: Diagnosis not present

## 2019-01-04 DIAGNOSIS — Z961 Presence of intraocular lens: Secondary | ICD-10-CM | POA: Diagnosis not present

## 2019-01-04 DIAGNOSIS — H43813 Vitreous degeneration, bilateral: Secondary | ICD-10-CM | POA: Diagnosis not present

## 2019-01-04 DIAGNOSIS — H26491 Other secondary cataract, right eye: Secondary | ICD-10-CM | POA: Diagnosis not present

## 2019-01-04 DIAGNOSIS — Z83511 Family history of glaucoma: Secondary | ICD-10-CM | POA: Diagnosis not present

## 2019-01-04 DIAGNOSIS — H5212 Myopia, left eye: Secondary | ICD-10-CM | POA: Diagnosis not present

## 2019-01-04 DIAGNOSIS — H524 Presbyopia: Secondary | ICD-10-CM | POA: Diagnosis not present

## 2019-01-04 DIAGNOSIS — H52203 Unspecified astigmatism, bilateral: Secondary | ICD-10-CM | POA: Diagnosis not present

## 2019-02-16 DIAGNOSIS — Z23 Encounter for immunization: Secondary | ICD-10-CM | POA: Diagnosis not present

## 2019-09-01 DIAGNOSIS — Z8249 Family history of ischemic heart disease and other diseases of the circulatory system: Secondary | ICD-10-CM | POA: Diagnosis not present

## 2019-09-01 DIAGNOSIS — Z809 Family history of malignant neoplasm, unspecified: Secondary | ICD-10-CM | POA: Diagnosis not present

## 2019-09-01 DIAGNOSIS — R32 Unspecified urinary incontinence: Secondary | ICD-10-CM | POA: Diagnosis not present

## 2019-09-01 DIAGNOSIS — I10 Essential (primary) hypertension: Secondary | ICD-10-CM | POA: Diagnosis not present

## 2019-09-01 DIAGNOSIS — Z833 Family history of diabetes mellitus: Secondary | ICD-10-CM | POA: Diagnosis not present

## 2019-09-01 DIAGNOSIS — M1990 Unspecified osteoarthritis, unspecified site: Secondary | ICD-10-CM | POA: Diagnosis not present

## 2019-09-01 DIAGNOSIS — Z791 Long term (current) use of non-steroidal anti-inflammatories (NSAID): Secondary | ICD-10-CM | POA: Diagnosis not present

## 2019-09-01 DIAGNOSIS — Z79899 Other long term (current) drug therapy: Secondary | ICD-10-CM | POA: Diagnosis not present

## 2019-09-01 DIAGNOSIS — G8929 Other chronic pain: Secondary | ICD-10-CM | POA: Diagnosis not present

## 2019-09-01 DIAGNOSIS — J309 Allergic rhinitis, unspecified: Secondary | ICD-10-CM | POA: Diagnosis not present

## 2019-11-19 DIAGNOSIS — M47812 Spondylosis without myelopathy or radiculopathy, cervical region: Secondary | ICD-10-CM | POA: Diagnosis not present

## 2019-11-19 DIAGNOSIS — J302 Other seasonal allergic rhinitis: Secondary | ICD-10-CM | POA: Diagnosis not present

## 2019-11-19 DIAGNOSIS — Z23 Encounter for immunization: Secondary | ICD-10-CM | POA: Diagnosis not present

## 2019-11-19 DIAGNOSIS — I10 Essential (primary) hypertension: Secondary | ICD-10-CM | POA: Diagnosis not present

## 2019-12-27 DIAGNOSIS — Z1231 Encounter for screening mammogram for malignant neoplasm of breast: Secondary | ICD-10-CM | POA: Diagnosis not present

## 2020-01-14 DIAGNOSIS — Z961 Presence of intraocular lens: Secondary | ICD-10-CM | POA: Diagnosis not present

## 2020-01-14 DIAGNOSIS — H524 Presbyopia: Secondary | ICD-10-CM | POA: Diagnosis not present

## 2020-01-14 DIAGNOSIS — Z83511 Family history of glaucoma: Secondary | ICD-10-CM | POA: Diagnosis not present

## 2020-01-14 DIAGNOSIS — H5212 Myopia, left eye: Secondary | ICD-10-CM | POA: Diagnosis not present

## 2020-01-14 DIAGNOSIS — H43813 Vitreous degeneration, bilateral: Secondary | ICD-10-CM | POA: Diagnosis not present

## 2020-01-14 DIAGNOSIS — H52203 Unspecified astigmatism, bilateral: Secondary | ICD-10-CM | POA: Diagnosis not present

## 2020-01-14 DIAGNOSIS — H26491 Other secondary cataract, right eye: Secondary | ICD-10-CM | POA: Diagnosis not present

## 2020-04-22 DIAGNOSIS — R69 Illness, unspecified: Secondary | ICD-10-CM | POA: Diagnosis not present

## 2020-04-30 DIAGNOSIS — M25561 Pain in right knee: Secondary | ICD-10-CM | POA: Diagnosis not present

## 2020-06-05 DIAGNOSIS — J302 Other seasonal allergic rhinitis: Secondary | ICD-10-CM | POA: Diagnosis not present

## 2020-06-05 DIAGNOSIS — N3941 Urge incontinence: Secondary | ICD-10-CM | POA: Diagnosis not present

## 2020-06-05 DIAGNOSIS — Z6829 Body mass index (BMI) 29.0-29.9, adult: Secondary | ICD-10-CM | POA: Diagnosis not present

## 2020-06-05 DIAGNOSIS — M47812 Spondylosis without myelopathy or radiculopathy, cervical region: Secondary | ICD-10-CM | POA: Diagnosis not present

## 2020-06-05 DIAGNOSIS — Z1389 Encounter for screening for other disorder: Secondary | ICD-10-CM | POA: Diagnosis not present

## 2020-06-05 DIAGNOSIS — M85859 Other specified disorders of bone density and structure, unspecified thigh: Secondary | ICD-10-CM | POA: Diagnosis not present

## 2020-06-05 DIAGNOSIS — Z0000 Encounter for general adult medical examination without abnormal findings: Secondary | ICD-10-CM | POA: Diagnosis not present

## 2020-06-05 DIAGNOSIS — M179 Osteoarthritis of knee, unspecified: Secondary | ICD-10-CM | POA: Diagnosis not present

## 2020-06-05 DIAGNOSIS — I10 Essential (primary) hypertension: Secondary | ICD-10-CM | POA: Diagnosis not present

## 2020-06-24 DIAGNOSIS — M8588 Other specified disorders of bone density and structure, other site: Secondary | ICD-10-CM | POA: Diagnosis not present

## 2020-06-24 DIAGNOSIS — M810 Age-related osteoporosis without current pathological fracture: Secondary | ICD-10-CM | POA: Diagnosis not present

## 2020-06-26 DIAGNOSIS — M85859 Other specified disorders of bone density and structure, unspecified thigh: Secondary | ICD-10-CM | POA: Diagnosis not present

## 2020-06-26 DIAGNOSIS — N3941 Urge incontinence: Secondary | ICD-10-CM | POA: Diagnosis not present

## 2020-07-01 DIAGNOSIS — M1712 Unilateral primary osteoarthritis, left knee: Secondary | ICD-10-CM | POA: Diagnosis not present

## 2020-07-01 DIAGNOSIS — M25562 Pain in left knee: Secondary | ICD-10-CM | POA: Diagnosis not present

## 2020-08-18 DIAGNOSIS — M25562 Pain in left knee: Secondary | ICD-10-CM | POA: Diagnosis not present

## 2020-08-18 DIAGNOSIS — M1712 Unilateral primary osteoarthritis, left knee: Secondary | ICD-10-CM | POA: Diagnosis not present

## 2020-09-18 ENCOUNTER — Telehealth: Payer: Self-pay | Admitting: *Deleted

## 2020-09-18 ENCOUNTER — Other Ambulatory Visit: Payer: Self-pay | Admitting: Orthopedic Surgery

## 2020-09-18 DIAGNOSIS — Z01811 Encounter for preprocedural respiratory examination: Secondary | ICD-10-CM

## 2020-09-18 NOTE — Telephone Encounter (Signed)
HEARTCARE STAFF: - Please ensure there is not already an duplicate clearance open for this procedure. - Under Visit Info/Reason for Call, type in Other and utilize the format Clearance MM/DD/YY or Clearance TBD. Do not use dashes or single digits. - If request is for dental extraction, please clarify the # of teeth to be extracted.  Request for surgical clearance: PT LAST 07/2017; PT WILL NEED A NEW PT APPT; S/W PT AND SHE HAS BEEN SCHEDULED TO SEE DR. VARANASI 09/23/20 AS A NEW PT  FOR PRE OP CLEARANCE   1. What type of surgery is being performed? LEFT TOTAL KNEE ARTHROPLASTY   2. When is this surgery scheduled? 10/17/20   3. What type of clearance is required (medical clearance vs. Pharmacy clearance to hold med vs. Both)? MEDICAL  4. Are there any medications that need to be held prior to surgery and how long? NONE LISTED   5. Practice name and name of physician performing surgery? [facility]; DR. Jenny Reichmann [place]   6. What is the office phone number? [phone]   7.   What is the office fax number? [phone] ATTN: JUDY DANIELS  8.   Anesthesia type (None, local, MAC, general) ? SPINAL   Julaine Hua 09/18/2020, 4:28 PM  _________________________________________________________________   (provider comments below)

## 2020-09-20 DIAGNOSIS — J302 Other seasonal allergic rhinitis: Secondary | ICD-10-CM | POA: Diagnosis not present

## 2020-09-20 DIAGNOSIS — Z833 Family history of diabetes mellitus: Secondary | ICD-10-CM | POA: Diagnosis not present

## 2020-09-20 DIAGNOSIS — N3941 Urge incontinence: Secondary | ICD-10-CM | POA: Diagnosis not present

## 2020-09-20 DIAGNOSIS — I4891 Unspecified atrial fibrillation: Secondary | ICD-10-CM | POA: Diagnosis not present

## 2020-09-20 DIAGNOSIS — Z8673 Personal history of transient ischemic attack (TIA), and cerebral infarction without residual deficits: Secondary | ICD-10-CM | POA: Diagnosis not present

## 2020-09-20 DIAGNOSIS — Z809 Family history of malignant neoplasm, unspecified: Secondary | ICD-10-CM | POA: Diagnosis not present

## 2020-09-20 DIAGNOSIS — I10 Essential (primary) hypertension: Secondary | ICD-10-CM | POA: Diagnosis not present

## 2020-09-20 DIAGNOSIS — M1990 Unspecified osteoarthritis, unspecified site: Secondary | ICD-10-CM | POA: Diagnosis not present

## 2020-09-20 DIAGNOSIS — Z008 Encounter for other general examination: Secondary | ICD-10-CM | POA: Diagnosis not present

## 2020-09-20 DIAGNOSIS — Z8249 Family history of ischemic heart disease and other diseases of the circulatory system: Secondary | ICD-10-CM | POA: Diagnosis not present

## 2020-09-20 DIAGNOSIS — D6869 Other thrombophilia: Secondary | ICD-10-CM | POA: Diagnosis not present

## 2020-09-23 ENCOUNTER — Encounter: Payer: Self-pay | Admitting: Interventional Cardiology

## 2020-09-23 ENCOUNTER — Ambulatory Visit (INDEPENDENT_AMBULATORY_CARE_PROVIDER_SITE_OTHER): Payer: Medicare HMO | Admitting: Interventional Cardiology

## 2020-09-23 ENCOUNTER — Other Ambulatory Visit: Payer: Self-pay

## 2020-09-23 VITALS — BP 130/80 | HR 80 | Ht 63.0 in | Wt 164.6 lb

## 2020-09-23 DIAGNOSIS — I493 Ventricular premature depolarization: Secondary | ICD-10-CM

## 2020-09-23 DIAGNOSIS — Z01810 Encounter for preprocedural cardiovascular examination: Secondary | ICD-10-CM

## 2020-09-23 DIAGNOSIS — I491 Atrial premature depolarization: Secondary | ICD-10-CM
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
## 2020-09-23 NOTE — Patient Instructions (Signed)
Medication Instructions:  Your physician recommends that you continue on your current medications as directed. Please refer to the Current Medication list given to you today.  *If you need a refill on your cardiac medications before your next appointment, please call your pharmacy*   Lab Work: none If you have labs (blood work) drawn today and your tests are completely normal, you will receive your results only by: Marland Kitchen MyChart Message (if you have MyChart) OR . A paper copy in the mail If you have any lab test that is abnormal or we need to change your treatment, we will call you to review the results.   Testing/Procedures: none   Follow-Up: At [facility], you and your health needs are our priority.  As part of our continuing mission to provide you with exceptional heart care, we have created designated Provider Care Teams.  These Care Teams include your primary Cardiologist (physician) and Advanced Practice Providers (APPs -  Physician Assistants and Nurse Practitioners) who all work together to provide you with the care you need, when you need it.  We recommend signing up for the patient portal called "MyChart".  Sign up information is provided on this After Visit Summary.  MyChart is used to connect with patients for Virtual Visits (Telemedicine).  Patients are able to view lab/test results, encounter notes, upcoming appointments, etc.  Non-urgent messages can be sent to your provider as well.   To learn more about what you can do with MyChart, go to [email or web address].    Your next appointment:   As needed  The format for your next appointment:   In Person  Provider:   You may see Dr Irish Lack or one of the following Advanced Practice Providers on your designated Care Team:    Melina Copa, PA-C  Ermalinda Barrios, PA-C    Other Instructions

## 2020-09-24 NOTE — Telephone Encounter (Signed)
Will forward to pre op for final notes

## 2020-09-24 NOTE — Telephone Encounter (Signed)
   Name: Maria Rowland  DOB: 26-Jul-1940  MRN: [id number]   Primary Cardiologist: Larae Grooms, MD  Chart reviewed as part of pre-operative protocol coverage.   Maria Rowland was last seen on 09/23/20 by Dr. Irish Lack. He does not recommend additional cardiac testing prior to surgery.   Therefore, based on ACC/AHA guidelines, the patient would be at acceptable risk for the planned procedure without further cardiovascular testing.   I will route this recommendation to the requesting party via Epic fax function and remove from pre-op pool. Please call with questions.  [place], PA 09/24/2020, 5:03 PM

## 2020-09-25 DIAGNOSIS — M1712 Unilateral primary osteoarthritis, left knee: Secondary | ICD-10-CM | POA: Diagnosis not present

## 2020-09-25 DIAGNOSIS — N3941 Urge incontinence: Secondary | ICD-10-CM | POA: Diagnosis not present

## 2020-09-25 DIAGNOSIS — I10 Essential (primary) hypertension: Secondary | ICD-10-CM | POA: Diagnosis not present

## 2020-10-07 NOTE — Care Plan (Signed)
Ortho Bundle Case Management Note  Patient Details  Name: Maria Rowland MRN: [id number] Date of Birth: 1941/05/07  Spoke with patient prior to surgery. She will discharge to home with family to assist. Rolling walker and CPM ordered. HHPT referral to [facility]. She will follow with OPPT at [facility]- [facility]. Patient and MD in agreement with plan. Choice offered.                     DME Arranged:  CPM,Walker rolling DME Agency:  [employer]  HH Arranged:  PT [place] Agency:  [facility] (now [facility])  Additional Comments: Please contact me with any questions of if this plan should need to change.  Ladell Heads,  [place] Specialist  [phone] 10/07/2020, 9:52 AM

## 2020-10-07 NOTE — Patient Instructions (Addendum)
DUE TO COVID-19 ONLY ONE VISITOR IS ALLOWED TO COME WITH YOU AND STAY IN THE WAITING ROOM ONLY DURING PRE OP AND PROCEDURE DAY OF SURGERY. THE 2 VISITORS  MAY VISIT WITH YOU AFTER SURGERY IN YOUR PRIVATE ROOM DURING VISITING HOURS ONLY!  YOU NEED TO HAVE A COVID 19 TEST ON__5/25_____ @_10 :30______, THIS TEST MUST BE DONE BEFORE SURGERY,  COVID TESTING SITE [place] [address], IT IS ON THE RIGHT GOING OUT [address] APPROXIMATELY  2 MINUTES PAST [employer] ON THE RIGHT. ONCE YOUR COVID TEST IS COMPLETED,  PLEASE BEGIN THE QUARANTINE INSTRUCTIONS AS OUTLINED IN YOUR HANDOUT.                Maria Rowland    Your procedure is scheduled on: 10/17/20   Report to [facility] Main  Entrance   Report to 5:15 AM     Call this number if you have problems the morning of surgery [phone]   . BRUSH YOUR TEETH MORNING OF SURGERY AND RINSE YOUR MOUTH OUT, NO CHEWING GUM CANDY OR MINTS.   No food after midnight.    You may have clear liquid until 4:30 AM.    At 4:00 AM drink pre surgery drink.   Nothing by mouth after 4:30 AM.   Take these medicines the morning of surgery with A SIP OF WATER: Metoprolol              You may not have any metal on your body including hair pins and              piercings  Do not wear jewelry, make-up, lotions, powders or perfumes, deodorant             Do not wear nail polish on your fingernails.  Do not shave  48 hours prior to surgery.     Do not bring valuables to the hospital. [place].  Contacts, dentures or bridgework may not be worn into surgery.      Patients discharged the day of surgery will not be allowed to drive home  . IF YOU ARE HAVING SURGERY AND GOING HOME THE SAME DAY, YOU MUST HAVE AN ADULT TO DRIVE YOU HOME AND BE WITH YOU FOR 24 HOURS.   YOU MAY GO HOME BY TAXI OR UBER OR ORTHERWISE, BUT AN ADULT MUST ACCOMPANY YOU HOME AND STAY WITH YOU  FOR 24 HOURS.  Name and phone number of your driver:  Special Instructions: N/A              Please read over the following fact sheets you were given: _____________________________________________________________________             [facility] - Preparing for Surgery Before surgery, you can play an important role.  Because skin is not sterile, your skin needs to be as free of germs as possible.  You can reduce the number of germs on your skin by washing with CHG (chlorahexidine gluconate) soap before surgery.  CHG is an antiseptic cleaner which kills germs and bonds with the skin to continue killing germs even after washing. Please DO NOT use if you have an allergy to CHG or antibacterial soaps.  If your skin becomes reddened/irritated stop using the CHG and inform your nurse when you arrive at Short Stay. Do not shave (including legs and underarms) for at  least 48 hours prior to the first CHG shower.   Please follow these instructions carefully:  1.  Shower with CHG Soap the night before surgery and the  morning of Surgery.  2.  If you choose to wash your hair, wash your hair first as usual with your  normal  shampoo.  3.  After you shampoo, rinse your hair and body thoroughly to remove the  shampoo.                                        4.  Use CHG as you would any other liquid soap.  You can apply chg directly  to the skin and wash                       Gently with a scrungie or clean washcloth.  5.  Apply the CHG Soap to your body ONLY FROM THE NECK DOWN.   Do not use on face/ open                           Wound or open sores. Avoid contact with eyes, ears mouth and genitals (private parts).                       Wash face,  Genitals (private parts) with your normal soap.             6.  Wash thoroughly, paying special attention to the area where your surgery  will be performed.  7.  Thoroughly rinse your body with warm water from the neck down.  8.  DO NOT shower/wash with your  normal soap after using and rinsing off  the CHG Soap.             9.  Pat yourself dry with a clean towel.            10.  Wear clean pajamas.            11.  Place clean sheets on your bed the night of your first shower and do not  sleep with pets. Day of Surgery : Do not apply any lotions/deodorants the morning of surgery.  Please wear clean clothes to the hospital/surgery center.  FAILURE TO FOLLOW THESE INSTRUCTIONS MAY RESULT IN THE CANCELLATION OF YOUR SURGERY PATIENT SIGNATURE_________________________________  NURSE SIGNATURE__________________________________  ________________________________________________________________________   Maria Rowland  An incentive spirometer is a tool that can help keep your lungs clear and active. This tool measures how well you are filling your lungs with each breath. Taking long deep breaths may help reverse or decrease the chance of developing breathing (pulmonary) problems (especially infection) following:  A long period of time when you are unable to move or be active. BEFORE THE PROCEDURE   If the spirometer includes an indicator to show your best effort, your nurse or respiratory therapist will set it to a desired goal.  If possible, sit up straight or lean slightly forward. Try not to slouch.  Hold the incentive spirometer in an upright position. INSTRUCTIONS FOR USE  1. Sit on the edge of your bed if possible, or sit up as far as you can in bed or on a chair. 2. Hold the incentive spirometer in an upright position. 3. Breathe out normally. 4. Place the mouthpiece in your mouth and  seal your lips tightly around it. 5. Breathe in slowly and as deeply as possible, raising the piston or the ball toward the top of the column. 6. Hold your breath for 3-5 seconds or for as long as possible. Allow the piston or ball to fall to the bottom of the column. 7. Remove the mouthpiece from your mouth and breathe out normally. 8. Rest for a  few seconds and repeat Steps 1 through 7 at least 10 times every 1-2 hours when you are awake. Take your time and take a few normal breaths between deep breaths. 9. The spirometer may include an indicator to show your best effort. Use the indicator as a goal to work toward during each repetition. 10. After each set of 10 deep breaths, practice coughing to be sure your lungs are clear. If you have an incision (the cut made at the time of surgery), support your incision when coughing by placing a pillow or rolled up towels firmly against it. Once you are able to get out of bed, walk around indoors and cough well. You may stop using the incentive spirometer when instructed by your caregiver.  RISKS AND COMPLICATIONS  Take your time so you do not get dizzy or light-headed.  If you are in pain, you may need to take or ask for pain medication before doing incentive spirometry. It is harder to take a deep breath if you are having pain. AFTER USE  Rest and breathe slowly and easily.  It can be helpful to keep track of a log of your progress. Your caregiver can provide you with a simple table to help with this. If you are using the spirometer at home, follow these instructions: [place] IF:   You are having difficultly using the spirometer.  You have trouble using the spirometer as often as instructed.  Your pain medication is not giving enough relief while using the spirometer.  You develop fever of 100.5 F (38.1 C) or higher. SEEK IMMEDIATE MEDICAL CARE IF:   You cough up bloody sputum that had not been present before.  You develop fever of 102 F (38.9 C) or greater.  You develop worsening pain at or near the incision site. MAKE SURE YOU:   Understand these instructions.  Will watch your condition.  Will get help right away if you are not doing well or get worse. Document Released: 09/20/2006 Document Revised: 08/02/2011 Document Reviewed: 11/21/2006 [facility] Patient  Information 2014 [place], [place].   ________________________________________________________________________

## 2020-10-08 ENCOUNTER — Encounter (HOSPITAL_COMMUNITY)
Admission: RE | Admit: 2020-10-08 | Discharge: 2020-10-08 | Disposition: A | Discharge status: Home / Self Care | Payer: Medicare HMO | Source: Ambulatory Visit | Attending: Orthopedic Surgery | Admitting: Orthopedic Surgery

## 2020-10-08 ENCOUNTER — Other Ambulatory Visit: Payer: Self-pay

## 2020-10-08 ENCOUNTER — Ambulatory Visit (HOSPITAL_COMMUNITY)
Admission: RE | Admit: 2020-10-08 | Discharge: 2020-10-08 | Disposition: A | Discharge status: Home / Self Care | Payer: Medicare HMO | Source: Ambulatory Visit | Attending: Orthopedic Surgery | Admitting: Orthopedic Surgery

## 2020-10-08 ENCOUNTER — Encounter (HOSPITAL_COMMUNITY): Payer: Self-pay

## 2020-10-08 DIAGNOSIS — Z01811 Encounter for preprocedural respiratory examination: Secondary | ICD-10-CM | POA: Insufficient documentation

## 2020-10-08 DIAGNOSIS — R918 Other nonspecific abnormal finding of lung field: Secondary | ICD-10-CM | POA: Diagnosis not present

## 2020-10-08 DIAGNOSIS — Z01818 Encounter for other preprocedural examination: Secondary | ICD-10-CM | POA: Diagnosis not present

## 2020-10-08 HISTORY — DX: Cardiac arrhythmia, unspecified: I49.9

## 2020-10-08 LAB — COMPREHENSIVE METABOLIC PANEL
ALT: 22 U/L (ref 0–44)
AST: 25 U/L (ref 15–41)
Albumin: 4.6 g/dL (ref 3.5–5.0)
Alkaline Phosphatase: 75 U/L (ref 38–126)
Anion gap: 9 (ref 5–15)
BUN: 15 mg/dL (ref 8–23)
CO2: 26 mmol/L (ref 22–32)
Calcium: 9.6 mg/dL (ref 8.9–10.3)
Chloride: 101 mmol/L (ref 98–111)
Creatinine, Ser: 0.56 mg/dL (ref 0.44–1.00)
GFR, Estimated: >60 mL/min (ref >60)
Glucose, Bld: 92 mg/dL (ref 70–99)
Potassium: 4.3 mmol/L (ref 3.5–5.1)
Sodium: 136 mmol/L (ref 135–145)
Total Bilirubin: 0.4 mg/dL (ref 0.3–1.2)
Total Protein: 7.2 g/dL (ref 6.5–8.1)

## 2020-10-08 LAB — CBC WITH DIFFERENTIAL/PLATELET
Abs Immature Granulocytes: 0.02 10*3/uL (ref 0.00–0.07)
Basophils Absolute: 0 10*3/uL (ref 0.0–0.1)
Basophils Relative: 1 %
Eosinophils Absolute: 0.1 10*3/uL (ref 0.0–0.5)
Eosinophils Relative: 2 %
HCT: 37.5 % (ref 36.0–46.0)
Hemoglobin: 12.6 g/dL (ref 12.0–15.0)
Immature Granulocytes: 0 %
Lymphocytes Relative: 34 %
Lymphs Abs: 2.2 10*3/uL (ref 0.7–4.0)
MCH: 32.6 pg (ref 26.0–34.0)
MCHC: 33.6 g/dL (ref 30.0–36.0)
MCV: 97.2 fL (ref 80.0–100.0)
Monocytes Absolute: 0.6 10*3/uL (ref 0.1–1.0)
Monocytes Relative: 9 %
Neutro Abs: 3.4 10*3/uL (ref 1.7–7.7)
Neutrophils Relative %: 54 %
Platelets: 305 10*3/uL (ref 150–400)
RBC: 3.86 MIL/uL — ABNORMAL LOW (ref 3.87–5.11)
RDW: 12.6 % (ref 11.5–15.5)
WBC: 6.3 10*3/uL (ref 4.0–10.5)
nRBC: 0 % (ref 0.0–0.2)

## 2020-10-08 LAB — URINALYSIS, ROUTINE W REFLEX MICROSCOPIC
Bilirubin Urine: NEGATIVE
Glucose, UA: NEGATIVE mg/dL
Hgb urine dipstick: NEGATIVE
Ketones, ur: NEGATIVE mg/dL
Leukocytes,Ua: NEGATIVE
Nitrite: NEGATIVE
Protein, ur: NEGATIVE mg/dL
Specific Gravity, Urine: 1.012 (ref 1.005–1.030)
pH: 6 (ref 5.0–8.0)

## 2020-10-08 LAB — SURGICAL PCR SCREEN
MRSA, PCR: NEGATIVE
Staphylococcus aureus: POSITIVE — AB

## 2020-10-08 LAB — PROTIME-INR
INR: 1 (ref 0.8–1.2)
Prothrombin Time: 13.1 seconds (ref 11.4–15.2)

## 2020-10-08 LAB — APTT: aPTT: 31 seconds (ref 24–36)

## 2020-10-08 NOTE — Progress Notes (Signed)
COVID Vaccine Completed:yes  Date COVID Vaccine completed:08/17/19-booster 03/20/20 COVID vaccine manufacturer: Pfizer     PCP - Dr. Charmaine Downs Cardiologist - Dr. Lendell Caprice  Chest x-ray - no EKG - 10/08/20-epic Stress Test - no ECHO - no Cardiac Cath - no Pacemaker/ICD device last checked:NA  Sleep Study - no CPAP -   Fasting Blood Sugar - NA Checks Blood Sugar _____ times a day  Blood Thinner Instructions:NA Aspirin Instructions: Last Dose:  Anesthesia review:   Patient denies shortness of breath, fever, cough and chest pain at PAT appointment Yes. Pt can climb 2 flights of stairs, do housework and ADLs with out SOB. She only gets SOB when she has palpitations.  Patient verbalized understanding of instructions that were given to them at the PAT appointment. Patient was also instructed that they will need to review over the PAT instructions again at home before surgery. yes

## 2020-10-14 ENCOUNTER — Ambulatory Visit: Payer: PRIVATE HEALTH INSURANCE | Admitting: Cardiology

## 2020-10-15 ENCOUNTER — Other Ambulatory Visit (HOSPITAL_COMMUNITY)
Admission: RE | Admit: 2020-10-15 | Discharge: 2020-10-15 | Disposition: A | Discharge status: Home / Self Care | Payer: Medicare HMO | Source: Ambulatory Visit | Attending: Orthopedic Surgery | Admitting: Orthopedic Surgery

## 2020-10-15 DIAGNOSIS — Z01812 Encounter for preprocedural laboratory examination: Secondary | ICD-10-CM | POA: Insufficient documentation

## 2020-10-15 DIAGNOSIS — Z20822 Contact with and (suspected) exposure to covid-19: Secondary | ICD-10-CM | POA: Diagnosis not present

## 2020-10-15 LAB — SARS CORONAVIRUS 2 (TAT 6-24 HRS): SARS Coronavirus 2: NEGATIVE

## 2020-10-16 MED ORDER — BUPIVACAINE LIPOSOME 1.3 % IJ SUSP
20.0000 mL | Freq: Once | INTRAMUSCULAR | Status: DC
  Filled 2020-10-16: qty 20

## 2020-10-16 NOTE — Anesthesia Preprocedure Evaluation (Addendum)
Anesthesia Evaluation  Patient identified by MRN, date of birth, ID band Patient awake    Reviewed: Allergy & Precautions, NPO status , Patient's Chart, lab work & pertinent test results  Airway Mallampati: II  TM Distance: >3 FB Neck ROM: Full    Dental  (+) Dental Advisory Given   Pulmonary neg pulmonary ROS,    breath sounds clear to auscultation       Cardiovascular hypertension, Pt. on medications and Pt. on home beta blockers  Rhythm:Regular Rate:Normal     Neuro/Psych negative neurological ROS     GI/Hepatic negative GI ROS, Neg liver ROS,   Endo/Other  negative endocrine ROS  Renal/GU negative Renal ROS     Musculoskeletal  (+) Arthritis ,   Abdominal   Peds  Hematology negative hematology ROS (+)   Anesthesia Other Findings   Reproductive/Obstetrics                            Lab Results  Component Value Date   WBC 6.3 10/08/2020   HGB 12.6 10/08/2020   HCT 37.5 10/08/2020   MCV 97.2 10/08/2020   PLT 305 10/08/2020    Anesthesia Physical Anesthesia Plan  ASA: II  Anesthesia Plan: Spinal   Post-op Pain Management:  Regional for Post-op pain   Induction:   PONV Risk Score and Plan: 2 and Propofol infusion, Ondansetron and Treatment may vary due to age or medical condition  Airway Management Planned: Natural Airway and Simple Face Mask  Additional Equipment:   Intra-op Plan:   Post-operative Plan:   Informed Consent: I have reviewed the patients History and Physical, chart, labs and discussed the procedure including the risks, benefits and alternatives for the proposed anesthesia with the patient or authorized representative who has indicated his/her understanding and acceptance.       Plan Discussed with: CRNA  Anesthesia Plan Comments:        Anesthesia Quick Evaluation

## 2020-10-16 NOTE — H&P (Signed)
TOTAL KNEE ADMISSION H&P  Patient is being admitted for left total knee arthroplasty.  Subjective:  Chief Complaint:left knee pain.  HPI: Maria Rowland, 80 y.o. female, has a history of pain and functional disability in the left knee due to arthritis and has failed non-surgical conservative treatments for greater than 12 weeks to includeNSAID's and/or analgesics, corticosteriod injections, viscosupplementation injections, flexibility and strengthening excercises, weight reduction as appropriate and activity modification.  Onset of symptoms was gradual, starting 4 years ago with gradually worsening course since that time. The patient noted no past surgery on the left knee(s).  Patient currently rates pain in the left knee(s) at 9 out of 10 with activity. Patient has night pain, worsening of pain with activity and weight bearing, pain that interferes with activities of daily living, pain with passive range of motion and joint swelling.  Patient has evidence of subchondral cysts, subchondral sclerosis, periarticular osteophytes, joint subluxation and joint space narrowing by imaging studies. This patient has had failure of all reasonable conservative care. There is no active infection.  Patient Active Problem List   Diagnosis Date Noted  . S/P total knee arthroplasty   . HTN (hypertension)   . Osteoarthritis   . PVC (premature ventricular contraction) 03/13/2013  . Other specified cardiac dysrhythmias(427.89) 03/13/2013   Past Medical History:  Diagnosis Date  . Constipation   . Dysrhythmia    papitations  . HTN (hypertension)   . Irregular heart beat   . Osteoarthritis    lt shoulder,knees, hips,  . S/P total knee arthroplasty   . Shortness of breath    when she has palpitations    Past Surgical History:  Procedure Laterality Date  . APPENDECTOMY    . CARPAL TUNNEL RELEASE    . CHOLECYSTECTOMY    . COLONOSCOPY    . EYE SURGERY Right    cataract with lens implant  . hip replacement  2011    . surgical repair of left hand    . TONSILLECTOMY    . TOTAL KNEE ARTHROPLASTY Right 04/09/2013   Procedure: TOTAL KNEE ARTHROPLASTY;  Surgeon: Vickey Huger, MD;  Location: [facility];  Service: Orthopedics;  Laterality: Right;    Current Facility-Administered Medications  Medication Dose Route Frequency Provider Last Rate Last Admin  . [START ON 10/17/2020] bupivacaine liposome (EXPAREL) 1.3 % injection 266 mg  20 mL Other Once Minda Ditto, [facility]       Current Outpatient Medications  Medication Sig Dispense Refill Last Dose  . acetaminophen (TYLENOL) 650 MG CR tablet Take 1,300 mg by mouth 2 (two) times daily.     Marland Kitchen amoxicillin (AMOXIL) 500 MG capsule Take 2,000 mg by mouth See admin instructions. Take 2000 mg 1 hour prior to dental work     . Calcium Carb-Cholecalciferol (CALCIUM 500 + D PO) Take 2 tablets by mouth in the morning and at bedtime.     . Carboxymethylcellul-Glycerin (LUBRICATING EYE DROPS OP) Place 1 drop into both eyes daily as needed (dryness / allergies).     . fluticasone (FLONASE) 50 MCG/ACT nasal spray Place 1 spray into both nostrils daily as needed for allergies or rhinitis.     . metoprolol succinate (TOPROL-XL) 100 MG 24 hr tablet Take 50-100 mg by mouth See admin instructions. Take 100 mg in the morning and 50 mg in the evening     . Multiple Vitamins-Minerals (CENTRUM SILVER PO) Take 1 tablet by mouth daily.     . Psyllium (METAMUCIL PO) Take 1 Dose by  mouth every evening. 1 dose = 1 teaspoon     . tiZANidine (ZANAFLEX) 4 MG tablet Take 4 mg by mouth every 8 (eight) hours as needed for muscle spasms.      Allergies  Allergen Reactions  . Meloxicam Other (See Comments)    Transaminitis  Increases liver enzymes  . Caffeine Palpitations    Rapid heartrate    Social History   Tobacco Use  . Smoking status: Never Smoker  . Smokeless tobacco: Never Used  Substance Use Topics  . Alcohol use: No    Family History  Problem Relation Age of Onset  . Cancer -  Prostate Father   . Hypertension Mother   . Dementia Mother   . Arthritis Brother   . COPD Brother   . Diabetes Brother   . COPD Brother      Review of Systems ROS: I have reviewed the patient's review of systems thoroughly and there are no positive responses as relates to the HPI. Objective:  Physical Exam  Vital signs in last 24 hours:   Well-developed well-nourished patient in no acute distress. Alert and oriented x3 HEENT:within normal limits Cardiac: Regular rate and rhythm Pulmonary: Lungs clear to auscultation Abdomen: Soft and nontender.  Normal active bowel sounds  Musculoskeletal: (left knee: Painful range of motion.  Limited range of motion.  No instability.  Neurovascular intact distally.  Trace effusion. Labs: Recent Results (from the past 2160 hour(s))  Urinalysis, Routine w reflex microscopic Urine, Clean Catch     Status: None   Collection Time: 10/08/20 10:13 AM  Result Value Ref Range   Color, Urine YELLOW YELLOW   APPearance CLEAR CLEAR   Specific Gravity, Urine 1.012 1.005 - 1.030   pH 6.0 5.0 - 8.0   Glucose, UA NEGATIVE NEGATIVE mg/dL   Hgb urine dipstick NEGATIVE NEGATIVE   Bilirubin Urine NEGATIVE NEGATIVE   Ketones, ur NEGATIVE NEGATIVE mg/dL   Protein, ur NEGATIVE NEGATIVE mg/dL   Nitrite NEGATIVE NEGATIVE   Leukocytes,Ua NEGATIVE NEGATIVE    Comment: Performed at [facility] [address] [place], [place] [address]  Surgical pcr screen     Status: Abnormal   Collection Time: 10/08/20 10:13 AM   Specimen: Nasal Mucosa; Nasal Swab  Result Value Ref Range   MRSA, PCR NEGATIVE NEGATIVE   Staphylococcus aureus POSITIVE (A) NEGATIVE    Comment: (NOTE) The Xpert SA Assay (FDA approved for NASAL specimens in patients 50 years of age and older), is one component of a comprehensive surveillance program. It is not intended to diagnose infection nor to guide or monitor treatment. Performed at [facility],  [place] [address] [place],  [address]   CBC WITH DIFFERENTIAL     Status: Abnormal   Collection Time: 10/08/20 10:30 AM  Result Value Ref Range   WBC 6.3 4.0 - 10.5 K/uL   RBC 3.86 (L) 3.87 - 5.11 MIL/uL   Hemoglobin 12.6 12.0 - 15.0 g/dL   HCT 37.5 36.0 - 46.0 %   MCV 97.2 80.0 - 100.0 fL   MCH 32.6 26.0 - 34.0 pg   MCHC 33.6 30.0 - 36.0 g/dL   RDW 12.6 11.5 - 15.5 %   Platelets 305 150 - 400 K/uL   nRBC 0.0 0.0 - 0.2 %   Neutrophils Relative % 54 %   Neutro Abs 3.4 1.7 - 7.7 K/uL   Lymphocytes Relative 34 %   Lymphs Abs 2.2 0.7 - 4.0 K/uL   Monocytes  Relative 9 %   Monocytes Absolute 0.6 0.1 - 1.0 K/uL   Eosinophils Relative 2 %   Eosinophils Absolute 0.1 0.0 - 0.5 K/uL   Basophils Relative 1 %   Basophils Absolute 0.0 0.0 - 0.1 K/uL   Immature Granulocytes 0 %   Abs Immature Granulocytes 0.02 0.00 - 0.07 K/uL    Comment: Performed at [facility], [place] [address] [place], [place] [address]  Comprehensive metabolic panel     Status: None   Collection Time: 10/08/20 10:30 AM  Result Value Ref Range   Sodium 136 135 - 145 mmol/L   Potassium 4.3 3.5 - 5.1 mmol/L   Chloride 101 98 - 111 mmol/L   CO2 26 22 - 32 mmol/L   Glucose, Bld 92 70 - 99 mg/dL    Comment: Glucose reference range applies only to samples taken after fasting for at least 8 hours.   BUN 15 8 - 23 mg/dL   Creatinine, Ser 0.56 0.44 - 1.00 mg/dL   Calcium 9.6 8.9 - 10.3 mg/dL   Total Protein 7.2 6.5 - 8.1 g/dL   Albumin 4.6 3.5 - 5.0 g/dL   AST 25 15 - 41 U/L   ALT 22 0 - 44 U/L   Alkaline Phosphatase 75 38 - 126 U/L   Total Bilirubin 0.4 0.3 - 1.2 mg/dL   GFR, Estimated >60 >60 mL/min    Comment: (NOTE) Calculated using the CKD-EPI Creatinine Equation (2021)    Anion gap 9 5 - 15    Comment: Performed at [facility], [place] [address] [place], [place] [address]  Protime-INR     Status: None   Collection Time: 10/08/20 10:30 AM  Result Value Ref Range    Prothrombin Time 13.1 11.4 - 15.2 seconds   INR 1.0 0.8 - 1.2    Comment: (NOTE) INR goal varies based on device and disease states. Performed at [facility], [place] [address], [place], [place] [address]   APTT     Status: None   Collection Time: 10/08/20 10:30 AM  Result Value Ref Range   aPTT 31 24 - 36 seconds    Comment: Performed at [facility], [place] [address] [place], [place] [address]  Type and screen Order type and screen if day of surgery is less than 15 days from draw of preadmission visit or order morning of surgery if day of surgery is greater than 6 days from preadmission visit.     Status: None   Collection Time: 10/08/20 10:30 AM  Result Value Ref Range   ABO/RH(D) O POS    Antibody Screen NEG    Sample Expiration 10/22/2020,2359    Extend sample reason      NO TRANSFUSIONS OR PREGNANCY IN THE PAST 3 MONTHS Performed at [facility], [place] [address], [place], [place] [address]   SARS CORONAVIRUS 2 (TAT 6-24 HRS) Nasopharyngeal Nasopharyngeal Swab     Status: None   Collection Time: 10/15/20 10:15 AM   Specimen: Nasopharyngeal Swab  Result Value Ref Range   SARS Coronavirus 2 NEGATIVE NEGATIVE    Comment: (NOTE) SARS-CoV-2 target nucleic acids are NOT DETECTED.  The SARS-CoV-2 RNA is generally detectable in upper and lower respiratory specimens during the acute phase of infection. Negative results do not preclude SARS-CoV-2 infection, do not rule out co-infections with other pathogens, and should not be used as the sole basis for treatment or other patient management decisions. Negative results  must be combined with clinical observations, patient history, and epidemiological information. The expected result is Negative.  Fact Sheet for Patients: SugarRoll.be  Fact Sheet for Healthcare Providers: https://www.woods-mathews.com/  This test is not yet approved or  cleared by the [place] FDA and  has been authorized for detection and/or diagnosis of SARS-CoV-2 by FDA under an Emergency Use Authorization (EUA). This EUA will remain  in effect (meaning this test can be used) for the duration of the COVID-19 declaration under Se ction 564(b)(1) of the Act, 21 U.S.C. section 360bbb-3(b)(1), unless the authorization is terminated or revoked sooner.  Performed at [facility], [place] [address], [place], [place] [address]     Estimated body mass index is 28.07 kg/m as calculated from the following:   Height as of 10/08/20: 5' 3.5" (1.613 m).   Weight as of 10/08/20: 73 kg.   Imaging Review Plain radiographs demonstrate severe degenerative joint disease of the left knee(s). The overall alignment ismild varus. The bone quality appears to be fair for age and reported activity level.      Assessment/Plan:  End stage arthritis, left knee   The patient history, physical examination, clinical judgment of the provider and imaging studies are consistent with end stage degenerative joint disease of the left knee(s) and total knee arthroplasty is deemed medically necessary. The treatment options including medical management, injection therapy arthroscopy and arthroplasty were discussed at length. The risks and benefits of total knee arthroplasty were presented and reviewed. The risks due to aseptic loosening, infection, stiffness, patella tracking problems, thromboembolic complications and other imponderables were discussed. The patient acknowledged the explanation, agreed to proceed with the plan and consent was signed. Patient is being admitted for inpatient treatment for surgery, pain control, PT, OT, prophylactic antibiotics, VTE prophylaxis, progressive ambulation and ADL's and discharge planning. The patient is planning to be discharged home with home health services     Patient's anticipated LOS is less than 2 midnights, meeting these  requirements: - Younger than 50 - Lives within 1 hour of care - Has a competent adult at home to recover with post-op recover - NO history of  - Chronic pain requiring opiods  - Diabetes  - Coronary Artery Disease  - Heart failure  - Heart attack  - Stroke  - DVT/VTE  - Cardiac arrhythmia  - Respiratory Failure/COPD  - Renal failure  - Anemia  - Advanced Liver disease

## 2020-10-17 ENCOUNTER — Ambulatory Visit (HOSPITAL_COMMUNITY): Payer: Medicare HMO | Admitting: Certified Registered Nurse Anesthetist

## 2020-10-17 ENCOUNTER — Encounter (HOSPITAL_COMMUNITY)
Admission: RE | Disposition: A | Discharge status: Home / Self Care | Payer: Self-pay | Source: Home / Self Care | Attending: Orthopedic Surgery

## 2020-10-17 ENCOUNTER — Observation Stay (HOSPITAL_COMMUNITY)
Admission: RE | Admit: 2020-10-17 | Discharge: 2020-10-19 | Disposition: A | Discharge status: Home / Self Care | Payer: Medicare HMO | Attending: Orthopedic Surgery | Admitting: Orthopedic Surgery

## 2020-10-17 ENCOUNTER — Other Ambulatory Visit: Payer: Self-pay

## 2020-10-17 ENCOUNTER — Encounter (HOSPITAL_COMMUNITY): Payer: Self-pay | Admitting: Orthopedic Surgery

## 2020-10-17 DIAGNOSIS — Z20822 Contact with and (suspected) exposure to covid-19: Secondary | ICD-10-CM | POA: Insufficient documentation

## 2020-10-17 DIAGNOSIS — Z96652 Presence of left artificial knee joint: Secondary | ICD-10-CM

## 2020-10-17 DIAGNOSIS — Z79899 Other long term (current) drug therapy: Secondary | ICD-10-CM | POA: Diagnosis not present

## 2020-10-17 DIAGNOSIS — M1712 Unilateral primary osteoarthritis, left knee: Secondary | ICD-10-CM | POA: Diagnosis not present

## 2020-10-17 DIAGNOSIS — G8918 Other acute postprocedural pain: Secondary | ICD-10-CM | POA: Diagnosis not present

## 2020-10-17 DIAGNOSIS — Z96651 Presence of right artificial knee joint: Secondary | ICD-10-CM | POA: Insufficient documentation

## 2020-10-17 DIAGNOSIS — I10 Essential (primary) hypertension: Secondary | ICD-10-CM | POA: Diagnosis not present

## 2020-10-17 DIAGNOSIS — Z96649 Presence of unspecified artificial hip joint: Secondary | ICD-10-CM | POA: Insufficient documentation

## 2020-10-17 DIAGNOSIS — I493 Ventricular premature depolarization: Secondary | ICD-10-CM | POA: Diagnosis not present

## 2020-10-17 HISTORY — PX: TOTAL KNEE ARTHROPLASTY: SHX125

## 2020-10-17 LAB — TYPE AND SCREEN
ABO/RH(D): O POS
Antibody Screen: NEGATIVE

## 2020-10-17 LAB — SARS CORONAVIRUS 2 (TAT 6-24 HRS): SARS Coronavirus 2: NEGATIVE

## 2020-10-17 SURGERY — ARTHROPLASTY, KNEE, TOTAL
Anesthesia: Spinal | Site: Knee | Laterality: Left

## 2020-10-17 MED ORDER — METOCLOPRAMIDE HCL 5 MG PO TABS: 5.0000 mg | ORAL_TABLET | Freq: Three times a day (TID) | ORAL | Status: DC | PRN

## 2020-10-17 MED ORDER — METOCLOPRAMIDE HCL 5 MG/ML IJ SOLN: 5.0000 mg | Freq: Three times a day (TID) | INTRAMUSCULAR | Status: DC | PRN

## 2020-10-17 MED ORDER — METHOCARBAMOL 500 MG IVPB - SIMPLE MED
500.0000 mg | Freq: Four times a day (QID) | INTRAVENOUS | Status: DC | PRN
  Administered 2020-10-17: 500 mg via INTRAVENOUS
  Filled 2020-10-17: qty 50

## 2020-10-17 MED ORDER — ROPIVACAINE HCL 5 MG/ML IJ SOLN
INTRAMUSCULAR | Status: DC | PRN
  Administered 2020-10-17: 20 mL via PERINEURAL

## 2020-10-17 MED ORDER — TIZANIDINE HCL 4 MG PO TABS: 4.0000 mg | ORAL_TABLET | Freq: Three times a day (TID) | ORAL | 0 refills | Status: AC

## 2020-10-17 MED ORDER — OXYCODONE HCL 5 MG PO TABS: 5.0000 mg | ORAL_TABLET | Freq: Four times a day (QID) | ORAL | 0 refills | Status: DC | PRN

## 2020-10-17 MED ORDER — FENTANYL CITRATE (PF) 100 MCG/2ML IJ SOLN
INTRAMUSCULAR | Status: DC | PRN
  Administered 2020-10-17 (×2): 50 ug via INTRAVENOUS

## 2020-10-17 MED ORDER — ACETAMINOPHEN 325 MG PO TABS
325.0000 mg | ORAL_TABLET | Freq: Four times a day (QID) | ORAL | Status: DC | PRN
  Administered 2020-10-18 – 2020-10-19 (×2): 650 mg via ORAL
  Filled 2020-10-17 (×2): qty 2

## 2020-10-17 MED ORDER — WATER FOR IRRIGATION, STERILE IR SOLN
Status: DC | PRN
  Administered 2020-10-17: 2000 mL

## 2020-10-17 MED ORDER — LACTATED RINGERS IV SOLN: INTRAVENOUS | Status: DC

## 2020-10-17 MED ORDER — FENTANYL CITRATE (PF) 100 MCG/2ML IJ SOLN
INTRAMUSCULAR | Status: AC
  Filled 2020-10-17: qty 2

## 2020-10-17 MED ORDER — TRANEXAMIC ACID-NACL 1000-0.7 MG/100ML-% IV SOLN
1000.0000 mg | INTRAVENOUS | Status: AC
  Administered 2020-10-17: 1000 mg via INTRAVENOUS
  Filled 2020-10-17: qty 100

## 2020-10-17 MED ORDER — DEXAMETHASONE SODIUM PHOSPHATE 10 MG/ML IJ SOLN
INTRAMUSCULAR | Status: AC
  Filled 2020-10-17: qty 1

## 2020-10-17 MED ORDER — ORAL CARE MOUTH RINSE: 15.0000 mL | Freq: Once | OROMUCOSAL | Status: AC

## 2020-10-17 MED ORDER — CEFAZOLIN SODIUM-DEXTROSE 1-4 GM/50ML-% IV SOLN
1.0000 g | Freq: Four times a day (QID) | INTRAVENOUS | Status: AC
Start: 2020-10-17 — End: 2020-10-17
  Administered 2020-10-17: 1 g via INTRAVENOUS
  Filled 2020-10-17: qty 50

## 2020-10-17 MED ORDER — DEXAMETHASONE SODIUM PHOSPHATE 10 MG/ML IJ SOLN
10.0000 mg | Freq: Two times a day (BID) | INTRAMUSCULAR | Status: DC
Start: 2020-10-18 — End: 2020-10-19
  Administered 2020-10-18 – 2020-10-19 (×2): 10 mg via INTRAVENOUS
  Filled 2020-10-17 (×2): qty 1

## 2020-10-17 MED ORDER — ASPIRIN EC 325 MG PO TBEC
325.0000 mg | DELAYED_RELEASE_TABLET | Freq: Two times a day (BID) | ORAL | Status: DC
Start: 2020-10-18 — End: 2020-10-19
  Administered 2020-10-18 – 2020-10-19 (×3): 325 mg via ORAL
  Filled 2020-10-17 (×3): qty 1

## 2020-10-17 MED ORDER — DIPHENHYDRAMINE HCL 12.5 MG/5ML PO ELIX: 12.5000 mg | ORAL_SOLUTION | ORAL | Status: DC | PRN

## 2020-10-17 MED ORDER — ONDANSETRON HCL 4 MG/2ML IJ SOLN: 4.0000 mg | Freq: Four times a day (QID) | INTRAMUSCULAR | Status: DC | PRN

## 2020-10-17 MED ORDER — ALUM & MAG HYDROXIDE-SIMETH 200-200-20 MG/5ML PO SUSP: 30.0000 mL | ORAL | Status: DC | PRN

## 2020-10-17 MED ORDER — ACETAMINOPHEN 500 MG PO TABS
1000.0000 mg | ORAL_TABLET | Freq: Once | ORAL | Status: AC
  Administered 2020-10-17: 1000 mg via ORAL
  Filled 2020-10-17: qty 2

## 2020-10-17 MED ORDER — CEFAZOLIN SODIUM-DEXTROSE 2-4 GM/100ML-% IV SOLN
2.0000 g | INTRAVENOUS | Status: AC
  Administered 2020-10-17: 2 g via INTRAVENOUS
  Filled 2020-10-17: qty 100

## 2020-10-17 MED ORDER — PROPOFOL 10 MG/ML IV BOLUS
INTRAVENOUS | Status: AC
  Filled 2020-10-17: qty 20

## 2020-10-17 MED ORDER — OXYCODONE HCL 5 MG PO TABS
ORAL_TABLET | ORAL | Status: AC
  Filled 2020-10-17: qty 2

## 2020-10-17 MED ORDER — PROPOFOL 500 MG/50ML IV EMUL
INTRAVENOUS | Status: DC | PRN
  Administered 2020-10-17: 50 ug/kg/min via INTRAVENOUS

## 2020-10-17 MED ORDER — PHENOL 1.4 % MT LIQD: 1.0000 | OROMUCOSAL | Status: DC | PRN

## 2020-10-17 MED ORDER — BISACODYL 5 MG PO TBEC: 5.0000 mg | DELAYED_RELEASE_TABLET | Freq: Every day | ORAL | Status: DC | PRN

## 2020-10-17 MED ORDER — FENTANYL CITRATE (PF) 100 MCG/2ML IJ SOLN: 25.0000 ug | INTRAMUSCULAR | Status: DC | PRN

## 2020-10-17 MED ORDER — DOCUSATE SODIUM 100 MG PO CAPS: 100.0000 mg | ORAL_CAPSULE | Freq: Two times a day (BID) | ORAL | 0 refills | Status: AC

## 2020-10-17 MED ORDER — MENTHOL 3 MG MT LOZG: 1.0000 | LOZENGE | OROMUCOSAL | Status: DC | PRN

## 2020-10-17 MED ORDER — PHENYLEPHRINE HCL-NACL 10-0.9 MG/250ML-% IV SOLN
INTRAVENOUS | Status: DC | PRN
  Administered 2020-10-17: 20 ug/min via INTRAVENOUS

## 2020-10-17 MED ORDER — DOCUSATE SODIUM 100 MG PO CAPS
100.0000 mg | ORAL_CAPSULE | Freq: Two times a day (BID) | ORAL | Status: DC
  Administered 2020-10-17 – 2020-10-19 (×4): 100 mg via ORAL
  Filled 2020-10-17 (×4): qty 1

## 2020-10-17 MED ORDER — ASPIRIN EC 325 MG PO TBEC: 325.0000 mg | DELAYED_RELEASE_TABLET | Freq: Two times a day (BID) | ORAL | 0 refills | Status: AC

## 2020-10-17 MED ORDER — TRANEXAMIC ACID-NACL 1000-0.7 MG/100ML-% IV SOLN
1000.0000 mg | Freq: Once | INTRAVENOUS | Status: AC
  Administered 2020-10-17: 1000 mg via INTRAVENOUS

## 2020-10-17 MED ORDER — PANTOPRAZOLE SODIUM 40 MG PO TBEC
40.0000 mg | DELAYED_RELEASE_TABLET | Freq: Every day | ORAL | Status: DC
  Administered 2020-10-17 – 2020-10-19 (×3): 40 mg via ORAL
  Filled 2020-10-17 (×3): qty 1

## 2020-10-17 MED ORDER — LACTATED RINGERS IV BOLUS
500.0000 mL | Freq: Once | INTRAVENOUS | Status: AC
  Administered 2020-10-17: 500 mL via INTRAVENOUS

## 2020-10-17 MED ORDER — SODIUM CHLORIDE 0.9 % IR SOLN
Status: DC | PRN
  Administered 2020-10-17: 1000 mL

## 2020-10-17 MED ORDER — MEPERIDINE HCL 50 MG/ML IJ SOLN
6.2500 mg | INTRAMUSCULAR | Status: DC | PRN
Start: 2020-10-17 — End: 2020-10-17
  Administered 2020-10-17: 6.25 mg via INTRAVENOUS

## 2020-10-17 MED ORDER — BUPIVACAINE LIPOSOME 1.3 % IJ SUSP
INTRAMUSCULAR | Status: DC | PRN
  Administered 2020-10-17: 20 mL

## 2020-10-17 MED ORDER — PHENYLEPHRINE HCL (PRESSORS) 10 MG/ML IV SOLN
INTRAVENOUS | Status: AC
  Filled 2020-10-17: qty 1

## 2020-10-17 MED ORDER — METOPROLOL TARTRATE 50 MG PO TABS
50.0000 mg | ORAL_TABLET | Freq: Every day | ORAL | Status: DC
  Administered 2020-10-17 – 2020-10-18 (×2): 50 mg via ORAL
  Filled 2020-10-17: qty 1

## 2020-10-17 MED ORDER — ONDANSETRON HCL 4 MG/2ML IJ SOLN
INTRAMUSCULAR | Status: AC
  Filled 2020-10-17: qty 2

## 2020-10-17 MED ORDER — SODIUM CHLORIDE 0.9 % IV SOLN: INTRAVENOUS | Status: DC

## 2020-10-17 MED ORDER — LACTATED RINGERS IV BOLUS
250.0000 mL | Freq: Once | INTRAVENOUS | Status: AC
  Administered 2020-10-17: 250 mL via INTRAVENOUS

## 2020-10-17 MED ORDER — BUPIVACAINE IN DEXTROSE 0.75-8.25 % IT SOLN
INTRATHECAL | Status: DC | PRN
  Administered 2020-10-17: 1.6 mL via INTRATHECAL

## 2020-10-17 MED ORDER — CEFAZOLIN SODIUM-DEXTROSE 1-4 GM/50ML-% IV SOLN
INTRAVENOUS | Status: AC
  Administered 2020-10-17: 1 g via INTRAVENOUS
  Filled 2020-10-17: qty 50

## 2020-10-17 MED ORDER — METOPROLOL TARTRATE 50 MG PO TABS
100.0000 mg | ORAL_TABLET | Freq: Every day | ORAL | Status: DC
  Administered 2020-10-18 – 2020-10-19 (×2): 100 mg via ORAL
  Filled 2020-10-17 (×3): qty 2

## 2020-10-17 MED ORDER — OXYCODONE HCL 5 MG PO TABS
10.0000 mg | ORAL_TABLET | ORAL | Status: DC | PRN
Start: 2020-10-17 — End: 2020-10-18
  Administered 2020-10-17 – 2020-10-18 (×2): 10 mg via ORAL
  Administered 2020-10-18: 15 mg via ORAL
  Filled 2020-10-17: qty 3
  Filled 2020-10-17 (×4): qty 2

## 2020-10-17 MED ORDER — BUPIVACAINE-EPINEPHRINE 0.5% -1:200000 IJ SOLN
INTRAMUSCULAR | Status: AC
  Filled 2020-10-17: qty 1

## 2020-10-17 MED ORDER — PROPOFOL 10 MG/ML IV BOLUS
INTRAVENOUS | Status: DC | PRN
  Administered 2020-10-17 (×2): 10 mg via INTRAVENOUS
  Administered 2020-10-17: 20 mg via INTRAVENOUS
  Administered 2020-10-17 (×2): 10 mg via INTRAVENOUS

## 2020-10-17 MED ORDER — HYDROMORPHONE HCL 1 MG/ML IJ SOLN: 0.5000 mg | INTRAMUSCULAR | Status: DC | PRN

## 2020-10-17 MED ORDER — 0.9 % SODIUM CHLORIDE (POUR BTL) OPTIME
TOPICAL | Status: DC | PRN
  Administered 2020-10-17: 1000 mL

## 2020-10-17 MED ORDER — POLYETHYLENE GLYCOL 3350 17 G PO PACK: 17.0000 g | PACK | Freq: Every day | ORAL | Status: DC | PRN

## 2020-10-17 MED ORDER — TRANEXAMIC ACID-NACL 1000-0.7 MG/100ML-% IV SOLN
INTRAVENOUS | Status: AC
  Filled 2020-10-17: qty 100

## 2020-10-17 MED ORDER — ONDANSETRON HCL 4 MG PO TABS: 4.0000 mg | ORAL_TABLET | Freq: Four times a day (QID) | ORAL | Status: DC | PRN

## 2020-10-17 MED ORDER — SODIUM CHLORIDE 0.9% FLUSH
INTRAVENOUS | Status: DC | PRN
  Administered 2020-10-17: 50 mL

## 2020-10-17 MED ORDER — MEPERIDINE HCL 50 MG/ML IJ SOLN
INTRAMUSCULAR | Status: AC
  Filled 2020-10-17: qty 1

## 2020-10-17 MED ORDER — POVIDONE-IODINE 10 % EX SWAB
2.0000 "application " | Freq: Once | CUTANEOUS | Status: AC
  Administered 2020-10-17: 2 via TOPICAL

## 2020-10-17 MED ORDER — CHLORHEXIDINE GLUCONATE 0.12 % MT SOLN
15.0000 mL | Freq: Once | OROMUCOSAL | Status: AC
  Administered 2020-10-17: 15 mL via OROMUCOSAL

## 2020-10-17 MED ORDER — METHOCARBAMOL 500 MG PO TABS
500.0000 mg | ORAL_TABLET | Freq: Four times a day (QID) | ORAL | Status: DC | PRN
  Administered 2020-10-17 – 2020-10-19 (×3): 500 mg via ORAL
  Filled 2020-10-17 (×3): qty 1

## 2020-10-17 MED ORDER — BUPIVACAINE-EPINEPHRINE 0.5% -1:200000 IJ SOLN
INTRAMUSCULAR | Status: DC | PRN
  Administered 2020-10-17: 30 mL

## 2020-10-17 MED ORDER — OXYCODONE HCL 5 MG PO TABS
5.0000 mg | ORAL_TABLET | ORAL | Status: DC | PRN
  Administered 2020-10-17 – 2020-10-18 (×4): 10 mg via ORAL
  Filled 2020-10-17: qty 2

## 2020-10-17 MED ORDER — MAGNESIUM CITRATE PO SOLN: 1.0000 | Freq: Once | ORAL | Status: DC | PRN

## 2020-10-17 MED ORDER — ONDANSETRON HCL 4 MG/2ML IJ SOLN
INTRAMUSCULAR | Status: DC | PRN
  Administered 2020-10-17: 4 mg via INTRAVENOUS

## 2020-10-17 MED ORDER — METHOCARBAMOL 500 MG IVPB - SIMPLE MED
INTRAVENOUS | Status: AC
  Filled 2020-10-17: qty 50

## 2020-10-17 SURGICAL SUPPLY — 53 items
ATTUNE MED DOME PAT 32 KNEE (Knees) ×2 IMPLANT
ATTUNE PS FEM LT SZ 4 CEM KNEE (Femur) ×2 IMPLANT
ATTUNE PSRP INSR SZ4 7 KNEE (Insert) ×2 IMPLANT
BAG DECANTER FOR FLEXI CONT (MISCELLANEOUS) ×2 IMPLANT
BAG ZIPLOCK 12X15 (MISCELLANEOUS) ×2 IMPLANT
BASEPLATE TIBIAL ROTATING SZ 4 (Knees) ×2 IMPLANT
BENZOIN TINCTURE PRP APPL 2/3 (GAUZE/BANDAGES/DRESSINGS) ×2 IMPLANT
BLADE SAGITTAL 25.0X1.19X90 (BLADE) ×2 IMPLANT
BLADE SAW SGTL 13.0X1.19X90.0M (BLADE) ×2 IMPLANT
BLADE SURG SZ10 CARB STEEL (BLADE) ×4 IMPLANT
BNDG ELASTIC 6X5.8 VLCR STR LF (GAUZE/BANDAGES/DRESSINGS) ×2 IMPLANT
BOOTIES KNEE HIGH SLOAN (MISCELLANEOUS) ×2 IMPLANT
BOWL SMART MIX CTS (DISPOSABLE) ×2 IMPLANT
CEMENT HV SMART SET (Cement) ×4 IMPLANT
COVER SURGICAL LIGHT HANDLE (MISCELLANEOUS) ×2 IMPLANT
COVER WAND RF STERILE (DRAPES) IMPLANT
CUFF TOURN SGL QUICK 34 (TOURNIQUET CUFF) ×1
CUFF TRNQT CYL 34X4.125X (TOURNIQUET CUFF) ×1 IMPLANT
DECANTER SPIKE VIAL GLASS SM (MISCELLANEOUS) ×4 IMPLANT
DRAPE U-SHAPE 47X51 STRL (DRAPES) ×2 IMPLANT
DRSG AQUACEL AG ADV 3.5X10 (GAUZE/BANDAGES/DRESSINGS) ×2 IMPLANT
DURAPREP 26ML APPLICATOR (WOUND CARE) ×2 IMPLANT
ELECT REM PT RETURN 15FT ADLT (MISCELLANEOUS) ×2 IMPLANT
GLOVE SRG 8 PF TXTR STRL LF DI (GLOVE) ×2 IMPLANT
GLOVE SURG LTX SZ7.5 (GLOVE) ×4 IMPLANT
GLOVE SURG UNDER POLY LF SZ8 (GLOVE) ×2
GOWN STRL REUS W/TWL XL LVL3 (GOWN DISPOSABLE) ×4 IMPLANT
HANDPIECE INTERPULSE COAX TIP (DISPOSABLE) ×1
HOLDER FOLEY CATH W/STRAP (MISCELLANEOUS) ×2 IMPLANT
HOOD PEEL AWAY FLYTE STAYCOOL (MISCELLANEOUS) ×6 IMPLANT
KIT TURNOVER KIT A (KITS) ×2 IMPLANT
MANIFOLD NEPTUNE II (INSTRUMENTS) ×2 IMPLANT
NEEDLE HYPO 22GX1.5 SAFETY (NEEDLE) ×4 IMPLANT
NS IRRIG 1000ML POUR BTL (IV SOLUTION) ×2 IMPLANT
PACK ICE MAXI GEL EZY WRAP (MISCELLANEOUS) ×2 IMPLANT
PACK TOTAL KNEE CUSTOM (KITS) ×2 IMPLANT
PADDING CAST COTTON 6X4 STRL (CAST SUPPLIES) ×2 IMPLANT
PENCIL SMOKE EVACUATOR (MISCELLANEOUS) ×2 IMPLANT
PIN DRILL FIX HALF THREAD (BIT) ×2 IMPLANT
PIN STEINMAN FIXATION KNEE (PIN) ×2 IMPLANT
PROTECTOR NERVE ULNAR (MISCELLANEOUS) ×2 IMPLANT
SET HNDPC FAN SPRY TIP SCT (DISPOSABLE) ×1 IMPLANT
STRIP CLOSURE SKIN 1/2X4 (GAUZE/BANDAGES/DRESSINGS) ×2 IMPLANT
SUT MNCRL AB 3-0 PS2 18 (SUTURE) ×2 IMPLANT
SUT VIC AB 0 CT1 36 (SUTURE) ×2 IMPLANT
SUT VIC AB 1 CT1 36 (SUTURE) ×4 IMPLANT
SUT VIC AB 2-0 CT1 27 (SUTURE) ×1
SUT VIC AB 2-0 CT1 TAPERPNT 27 (SUTURE) ×1 IMPLANT
SYR CONTROL 10ML LL (SYRINGE) ×4 IMPLANT
TRAY FOLEY MTR SLVR 14FR STAT (SET/KITS/TRAYS/PACK) ×2 IMPLANT
TRAY FOLEY MTR SLVR 16FR STAT (SET/KITS/TRAYS/PACK) IMPLANT
WATER STERILE IRR 1000ML POUR (IV SOLUTION) ×4 IMPLANT
WRAP KNEE MAXI GEL POST OP (GAUZE/BANDAGES/DRESSINGS) ×2 IMPLANT

## 2020-10-17 NOTE — Anesthesia Procedure Notes (Signed)
Spinal  Patient location during procedure: OR Start time: 10/17/2020 7:25 AM End time: 10/17/2020 7:30 AM Reason for block: surgical anesthesia Staffing Performed: anesthesiologist  Anesthesiologist: Suzette Battiest, MD Preanesthetic Checklist Completed: patient identified, IV checked, site marked, risks and benefits discussed, surgical consent, monitors and equipment checked, pre-op evaluation and timeout performed Spinal Block Patient position: sitting Prep: DuraPrep Patient monitoring: heart rate, cardiac monitor, continuous pulse ox and blood pressure Approach: midline Location: L4-5 Injection technique: single-shot Needle Needle type: Pencan  Needle gauge: 24 G Needle length: 9 cm Assessment Sensory level: T4 Events: CSF return

## 2020-10-17 NOTE — Progress Notes (Signed)
Orthopedic Tech Progress Note Patient Details:  Maria Rowland [birth year] [id number] Put patient on CPM shortly afterwards she stated she needed to used the restroom, took her off got her tech went to the ED and came back up to reapply CPM. Was not able to reach 80 degrees, around 60 she started showing pain in her face so we stopped at 29. CPM Left Knee CPM Left Knee: On Left Knee Flexion (Degrees): 0 Left Knee Extension (Degrees): 60 Additional Comments: fresh ice  Post Interventions Patient Tolerated: Well Instructions Provided: Care of [place] 10/17/2020, 4:46 PM

## 2020-10-17 NOTE — Progress Notes (Signed)
Physical Therapy Treatment Patient Details Name: Maria Rowland MRN: [id number] DOB: 04-28-1941 Today's Date: 10/17/2020    History of Present Illness P s/p L TKR and with hx of R TKR and THR    PT Comments    Pt with improved BP but now c/o dizziness with attempts to mobilize.  Pt up to EOB sitting and then stood x 2 but only able to take one step fwd and bk each time - BP 162/58.  Rn aware.  Follow Up Recommendations  Home health PT     Equipment Recommendations  None recommended by PT    Recommendations for Other Services       Precautions / Restrictions Precautions Precautions: Fall Restrictions Weight Bearing Restrictions: No Other Position/Activity Restrictions: WBAT    Mobility  Bed Mobility Overal bed mobility: Needs Assistance Bed Mobility: Supine to Sit;Sit to Supine     Supine to sit: Min assist Sit to supine: Min assist   General bed mobility comments: cues for sequence and assist to manage L LE    Transfers Overall transfer level: Needs assistance Equipment used: Rolling walker (2 wheeled) Transfers: Sit to/from Stand Sit to Stand: Min assist;Mod assist         General transfer comment: Pt sitting EOB ~ 10 min 2* c/o dizziness (BP 177/68), cues for LE management and use of UEs to self assist.  Ambulation/Gait Ambulation/Gait assistance: Min assist Gait Distance (Feet): 2 Feet Assistive device: Rolling walker (2 wheeled) Gait Pattern/deviations: Step-to pattern;Decreased step length - right;Decreased step length - left;Shuffle;Trunk flexed Gait velocity: decr   General Gait Details: ltd by ongoing c/o dizziness - cues for sequence, posture and position from RW.   Stairs             Wheelchair Mobility    Modified Rankin (Stroke Patients Only)       Balance Overall balance assessment: Needs assistance Sitting-balance support: Feet supported;No upper extremity supported Sitting balance-Leahy Scale: Fair     Standing balance  support: Bilateral upper extremity supported Standing balance-Leahy Scale: Poor                              Cognition Arousal/Alertness: Awake/alert Behavior During Therapy: WFL for tasks assessed/performed Overall Cognitive Status: Within Functional Limits for tasks assessed                                        Exercises Total Joint Exercises Ankle Circles/Pumps: AROM;Both;15 reps;Supine Quad Sets: AROM;Both;5 reps;Supine Heel Slides: AAROM;Left;5 reps;Supine Straight Leg Raises: AAROM;AROM;Left;10 reps;Supine    General Comments        Pertinent Vitals/Pain Pain Assessment: 0-10 Pain Score: 4  Pain Location: L knee Pain Descriptors / Indicators: Aching;Sore Pain Intervention(s): Limited activity within patient's tolerance;Monitored during session;Premedicated before session;Ice applied    Home Living Family/patient expects to be discharged to:: Private residence Living Arrangements: Alone Available Help at Discharge: Family;Available 24 hours/day Type of Home: House Home Access: Stairs to enter Entrance Stairs-Rails: None Home Layout: Able to live on main level with bedroom/bathroom Home Equipment: Walker - 2 wheels;Cane - single point;Bedside commode;Shower seat      Prior Function Level of Independence: Independent          PT Goals (current goals can now be found in the care plan section) Acute Rehab PT Goals Patient Stated Goal: REgain IND  PT Goal Formulation: With patient Time For Goal Achievement: 10/24/20 Potential to Achieve Goals: Good Progress towards PT goals: Progressing toward goals    Frequency    7X/week      PT Plan Current plan remains appropriate    Co-evaluation              AM-PAC PT "6 Clicks" Mobility   Outcome Measure  Help needed turning from your back to your side while in a flat bed without using bedrails?: A Little Help needed moving from lying on your back to sitting on the side of a  flat bed without using bedrails?: A Little Help needed moving to and from a bed to a chair (including a wheelchair)?: A Little Help needed standing up from a chair using your arms (e.g., wheelchair or bedside chair)?: A Little Help needed to walk in hospital room?: A Lot Help needed climbing 3-5 steps with a railing? : A Lot 6 Click Score: 16    End of Session Equipment Utilized During Treatment: Gait belt Activity Tolerance: Patient limited by fatigue Patient left: in bed;with call bell/phone within reach Nurse Communication: Mobility status PT Visit Diagnosis: Muscle weakness (generalized) (M62.81);Difficulty in walking, not elsewhere classified (R26.2);Pain Pain - Right/Left: Left Pain - part of body: Knee     Time: 1400-1426 PT Time Calculation (min) (ACUTE ONLY): 26 min  Charges:  $Gait Training: 8-22 mins $Therapeutic Activity: 8-22 mins                     Debe Coder PT Acute Rehabilitation Services Pager [phone] Office [phone]    Rashmi Tallent 10/17/2020, 3:17 PM

## 2020-10-17 NOTE — Transfer of Care (Signed)
Immediate Anesthesia Transfer of Care Note  Patient: Maria Rowland  Procedure(s) Performed: TOTAL KNEE ARTHROPLASTY (Left Knee)  Patient Location: PACU  Anesthesia Type:Spinal  Level of Consciousness: drowsy and patient cooperative  Airway & Oxygen Therapy: Patient Spontanous Breathing and Patient connected to face mask oxygen  Post-op Assessment: Report given to RN and Post -op Vital signs reviewed and stable  Post vital signs: Reviewed and stable  Last Vitals:  Vitals Value Taken Time  BP 157/68 10/17/20 0933  Temp    Pulse 62 10/17/20 0938  Resp 12 10/17/20 0938  SpO2 100 % 10/17/20 0938  Vitals shown include unvalidated device data.  Last Pain:  Vitals:   10/17/20 0546  TempSrc: Oral  PainSc: 0-No pain         Complications: No complications documented.

## 2020-10-17 NOTE — Discharge Instructions (Signed)
INSTRUCTIONS AFTER JOINT REPLACEMENT   o Remove items at home which could result in a fall. This includes throw rugs or furniture in walking pathways o ICE to the affected joint every three hours while awake for 30 minutes at a time, for at least the first 3-5 days, and then as needed for pain and swelling.  Continue to use ice for pain and swelling. You may notice swelling that will progress down to the foot and ankle.  This is normal after surgery.  Elevate your leg when you are not up walking on it.   o Continue to use the breathing machine you got in the hospital (incentive spirometer) which will help keep your temperature down.  It is common for your temperature to cycle up and down following surgery, especially at night when you are not up moving around and exerting yourself.  The breathing machine keeps your lungs expanded and your temperature down.   DIET:  As you were doing prior to hospitalization, we recommend a well-balanced diet.  DRESSING / WOUND CARE / SHOWERING  Keep the surgical dressing until follow up.  The dressing is water proof, so you can shower without any extra covering.  IF THE DRESSING FALLS OFF or the wound gets wet inside, change the dressing with sterile gauze.  Please use good hand washing techniques before changing the dressing.  Do not use any lotions or creams on the incision until instructed by your surgeon.    ACTIVITY  o Increase activity slowly as tolerated, but follow the weight bearing instructions below.   o No driving for 6 weeks or until further direction given by your physician.  You cannot drive while taking narcotics.  o No lifting or carrying greater than 10 lbs. until further directed by your surgeon. o Avoid periods of inactivity such as sitting longer than an hour when not asleep. This helps prevent blood clots.  o You may return to work once you are authorized by your doctor.     WEIGHT BEARING   Weight bearing as tolerated with assist  device (walker, cane, etc) as directed, use it as long as suggested by your surgeon or therapist, typically at least 4-6 weeks.   EXERCISES  Results after joint replacement surgery are often greatly improved when you follow the exercise, range of motion and muscle strengthening exercises prescribed by your doctor. Safety measures are also important to protect the joint from further injury. Any time any of these exercises cause you to have increased pain or swelling, decrease what you are doing until you are comfortable again and then slowly increase them. If you have problems or questions, call your caregiver or physical therapist for advice.   Rehabilitation is important following a joint replacement. After just a few days of immobilization, the muscles of the leg can become weakened and shrink (atrophy).  These exercises are designed to build up the tone and strength of the thigh and leg muscles and to improve motion. Often times heat used for twenty to thirty minutes before working out will loosen up your tissues and help with improving the range of motion but do not use heat for the first two weeks following surgery (sometimes heat can increase post-operative swelling).   These exercises can be done on a training (exercise) mat, on the floor, on a table or on a bed. Use whatever works the best and is most comfortable for you.    Use music or television while you are exercising so that   the exercises are a pleasant break in your day. This will make your life better with the exercises acting as a break in your routine that you can look forward to.   Perform all exercises about fifteen times, three times per day or as directed.  You should exercise both the operative leg and the other leg as well.  Exercises include:   . Quad Sets - Tighten up the muscle on the front of the thigh (Quad) and hold for 5-10 seconds.   . Straight Leg Raises - With your knee straight (if you were given a brace, keep it on),  lift the leg to 60 degrees, hold for 3 seconds, and slowly lower the leg.  Perform this exercise against resistance later as your leg gets stronger.  . Leg Slides: Lying on your back, slowly slide your foot toward your buttocks, bending your knee up off the floor (only go as far as is comfortable). Then slowly slide your foot back down until your leg is flat on the floor again.  . Angel Wings: Lying on your back spread your legs to the side as far apart as you can without causing discomfort.  . Hamstring Strength:  Lying on your back, push your heel against the floor with your leg straight by tightening up the muscles of your buttocks.  Repeat, but this time bend your knee to a comfortable angle, and push your heel against the floor.  You may put a pillow under the heel to make it more comfortable if necessary.   A rehabilitation program following joint replacement surgery can speed recovery and prevent re-injury in the future due to weakened muscles. Contact your doctor or a physical therapist for more information on knee rehabilitation.    CONSTIPATION  Constipation is defined medically as fewer than three stools per week and severe constipation as less than one stool per week.  Even if you have a regular bowel pattern at home, your normal regimen is likely to be disrupted due to multiple reasons following surgery.  Combination of anesthesia, postoperative narcotics, change in appetite and fluid intake all can affect your bowels.   YOU MUST use at least one of the following options; they are listed in order of increasing strength to get the job done.  They are all available over the counter, and you may need to use some, POSSIBLY even all of these options:    Drink plenty of fluids (prune juice may be helpful) and high fiber foods Colace 100 mg by mouth twice a day  Senokot for constipation as directed and as needed Dulcolax (bisacodyl), take with full glass of water  Miralax (polyethylene glycol)  once or twice a day as needed.  If you have tried all these things and are unable to have a bowel movement in the first 3-4 days after surgery call either your surgeon or your primary doctor.    If you experience loose stools or diarrhea, hold the medications until you stool forms back up.  If your symptoms do not get better within 1 week or if they get worse, check with your doctor.  If you experience "the worst abdominal pain ever" or develop nausea or vomiting, please contact the office immediately for further recommendations for treatment.   ITCHING:  If you experience itching with your medications, try taking only a single pain pill, or even half a pain pill at a time.  You can also use Benadryl over the counter for itching or also to   help with sleep.   TED HOSE STOCKINGS:  Use stockings on both legs until for at least 2 weeks or as directed by physician office. They may be removed at night for sleeping.  MEDICATIONS:  See your medication summary on the "After Visit Summary" that nursing will review with you.  You may have some home medications which will be placed on hold until you complete the course of blood thinner medication.  It is important for you to complete the blood thinner medication as prescribed.  PRECAUTIONS:  If you experience chest pain or shortness of breath - call 911 immediately for transfer to the hospital emergency department.   If you develop a fever greater that 101 F, purulent drainage from wound, increased redness or drainage from wound, foul odor from the wound/dressing, or calf pain - CONTACT YOUR SURGEON.                                                   FOLLOW-UP APPOINTMENTS:  If you do not already have a post-op appointment, please call the office for an appointment to be seen by your surgeon.  Guidelines for how soon to be seen are listed in your "After Visit Summary", but are typically between 1-4 weeks after surgery.  OTHER INSTRUCTIONS:   Knee  Replacement:  Do not place pillow under knee, focus on keeping the knee straight while resting. CPM instructions: 0-90 degrees, 2 hours in the morning, 2 hours in the afternoon, and 2 hours in the evening. Place foam block, curve side up under heel at all times except when in CPM or when walking.  DO NOT modify, tear, cut, or change the foam block in any way.  POST-OPERATIVE OPIOID TAPER INSTRUCTIONS: . It is important to wean off of your opioid medication as soon as possible. If you do not need pain medication after your surgery it is ok to stop day one. Marland Kitchen Opioids include: o Codeine, Hydrocodone(Norco, Vicodin), Oxycodone(Percocet, oxycontin) and hydromorphone amongst others.  . Long term and even short term use of opiods can cause: o Increased pain response o Dependence o Constipation o Depression o Respiratory depression o And more.  . Withdrawal symptoms can include o Flu like symptoms o Nausea, vomiting o And more . Techniques to manage these symptoms o Hydrate well o Eat regular healthy meals o Stay active o Use relaxation techniques(deep breathing, meditating, yoga) . Do Not substitute Alcohol to help with tapering . If you have been on opioids for less than two weeks and do not have pain than it is ok to stop all together.  . Plan to wean off of opioids o This plan should start within one week post op of your joint replacement. o Maintain the same interval or time between taking each dose and first decrease the dose.  o Cut the total daily intake of opioids by one tablet each day o Next start to increase the time between doses. o The last dose that should be eliminated is the evening dose.    MAKE SURE YOU:  . Understand these instructions.  . Get help right away if you are not doing well or get worse.   Dental Antibiotics:  In most cases prophylactic antibiotics for Dental procdeures after total joint surgery are not necessary.  Exceptions are as follows:  1.  History of prior  total joint infection  2. Severely immunocompromised (Organ Transplant, cancer chemotherapy, Rheumatoid biologic meds such as Eden)  3. Poorly controlled diabetes (A1C &gt; 8.0, blood glucose over 200)  If you have one of these conditions, contact your surgeon for an antibiotic prescription, prior to your dental procedure.  Thank you for letting us be a part of your medical care team.  It is a privilege we respect greatly.  We hope these instructions will help you stay on track for a fast and full recovery!

## 2020-10-17 NOTE — Op Note (Signed)
PATIENT ID:      Maria Rowland  MRN:     [id number] DOB/AGE:    08/06/1940 / 80 y.o.       OPERATIVE REPORT   DATE OF PROCEDURE:  10/17/2020      PREOPERATIVE DIAGNOSIS:   LEFT KNEE DEGENERATIVE JOINT DISEASE      Estimated body mass index is 28.07 kg/m as calculated from the following:   Height as of 10/08/20: 5' 3.5" (1.613 m).   Weight as of this encounter: 73 kg.                                                       POSTOPERATIVE DIAGNOSIS:   Same                                                                  PROCEDURE:  Procedure(s): TOTAL KNEE ARTHROPLASTY Using DepuyAttune RP implants #4 Femur, #4Tibia, 7 mm Attune RP bearing, 32 Patella    SURGEON: Alta Corning  ASSISTANT:   Harmon Dun PA-C   (Present and scrubbed throughout the case, critical for assistance with exposure, retraction, instrumentation, and closure.)        ANESTHESIA: spinal, 20cc Exparel, 50cc 0.25% Marcaine EBL: min cc FLUID REPLACEMENT: unk cc crystaloid TOURNIQUET: DRAINS: None TRANEXAMIC ACID: 1gm IV, 2gm topical COMPLICATIONS:  None         INDICATIONS FOR PROCEDURE: The patient has  LEFT KNEE DEGENERATIVE JOINT DISEASE, varus deformities, XR shows bone on bone arthritis, lateral subluxation of tibia. Patient has failed all conservative measures including anti-inflammatory medicines, narcotics, attempts at exercise and weight loss, cortisone injections and viscosupplementation.  Risks and benefits of surgery have been discussed, questions answered.   DESCRIPTION OF PROCEDURE: The patient identified by armband, received  IV antibiotics, in the holding area at [facility]. Patient taken to the operating room, appropriate anesthetic monitors were attached, and spinal anesthesia was  induced. IV Tranexamic acid was given.Tourniquet applied high to the operative thigh. Lateral post and foot positioner applied to the table, the lower extremity was then prepped and draped in usual sterile fashion from  the toes to the tourniquet. Time-out procedure was performed. Harmon Dun [facility], was present and scrubbed throughout the case, critical for assistance with, positioning, exposure, retraction, instrumentation, and closure.The skin and subcutaneous tissue along the incision was injected with 20 cc of a mixture of Exparel and Marcaine solution, using a 20-gauge by 1-1/2 inch needle. We began the operation, with the knee flexed 130 degrees, by making the anterior midline incision starting at handbreadth above the patella going over the patella 1 cm medial to and 4 cm distal to the tibial tubercle. Small bleeders in the skin and the subcutaneous tissue identified and cauterized. Transverse retinaculum was incised and reflected medially and a medial parapatellar arthrotomy was accomplished. the patella was everted and theprepatellar fat pad resected. The superficial medial collateral ligament was then elevated from anterior to posterior along the proximal flare of the tibia and anterior half of the menisci resected. The knee was hyperflexed exposing bone on bone arthritis. Peripheral and  notch osteophytes as well as the cruciate ligaments were then resected. We continued to work our way around posteriorly along the proximal tibia, and externally rotated the tibia subluxing it out from underneath the femur. A McHale PCL retractor was placed through the notch and a lateral Hohmann retractor placed, and we then entered the proximal tibia in line with the Depuy starter drill in line with the axis of the tibia followed by an intramedullary guide rod and 0-degree posterior slope cutting guide. The tibial cutting guide, 4 degree posterior sloped, was pinned into place allowing resection of 2 mm of bone medially and 10 mm of bone laterally. Satisfied with the tibial resection, we then entered the distal femur 2 mm anterior to the PCL origin with the intramedullary guide rod and applied the distal femoral cutting guide set at 9  mm, with 5 degrees of valgus. This was pinned along the epicondylar axis. At this point, the distal femoral cut was accomplished without difficulty. We then sized for a #4 femoral component and pinned the guide in 3 degrees of external rotation. The chamfer cutting guide was pinned into place. The anterior, posterior, and chamfer cuts were accomplished without difficulty followed by the Attune RP box cutting guide and the box cut. We also removed posterior osteophytes from the posterior femoral condyles. The posterior capsule was injected with Exparel solution. The knee was brought into full extension. We checked our extension gap and fit a 7 mm bearing. Distracting in extension with a lamina spreader,  bleeders in the posterior capsule, Posterior medial and posterior lateral gutter were cauterized.  The transexamic acid-soaked sponge was then placed in the gap of the knee in extension. The knee was flexed 30. The posterior patella cut was accomplished with the 9.5 mm Attune cutting guide, sized for a 49mm dome, and the fixation pegs drilled.The knee was then once again hyperflexed exposing the proximal tibia. We sized for a # 4 tibial base plate, applied the smokestack and the conical reamer followed by the the Delta fin keel punch. We then hammered into place the Attune RP trial femoral component, drilled the lugs, inserted a  7 mm trial bearing, trial patellar button, and took the knee through range of motion from 0-130 degrees. Medial and lateral ligamentous stability was checked. No thumb pressure was required for patellar Tracking. The tourniquet was released @45  min. All trial components were removed, mating surfaces irrigated with pulse lavage, and dried with suction and sponges. 10 cc of the Exparel solution was applied to the cancellus bone of the patella distal femur and proximal tibia.  After waiting 30 seconds, the bony surfaces were again, dried with sponges. A double batch of DePuy HV cement was  mixed and applied to all bony metallic mating surfaces except for the posterior condyles of the femur itself. In order, we hammered into place the tibial tray and removed excess cement, the femoral component and removed excess cement. The final Attune RP bearing was inserted, and the knee brought to full extension with compression. The patellar button was clamped into place, and excess cement removed. The knee was held at 30 flexion with compression, while the cement cured. The wound was irrigated out with normal saline solution pulse lavage. The rest of the Exparel was injected into the parapatellar arthrotomy, subcutaneous tissues, and periosteal tissues. The parapatellar arthrotomy was closed with running #1 Vicryl suture. The subcutaneous tissue with 3-0 undyed Vicryl suture, and the skin with running 3-0 SQ vicryl. An Aquacil and  Ace wrap were applied. The patient was taken to recovery room without difficulty.   Alta Corning 10/17/2020, 10:13 AM

## 2020-10-17 NOTE — Evaluation (Signed)
Physical Therapy Evaluation Patient Details Name: Maria Rowland MRN: [id number] DOB: 1941-05-03 Today's Date: 10/17/2020   History of Present Illness  P s/p L TKR and with hx of R TKR and THR  Clinical Impression  Pt s/p L TKR and presents with decreased L LE strength/ROM, post op pain and elevated BP (196/78) limiting functional mobility.  Pt performed ltd therex program but OOB deferred 2* elevated BP.  Pt should progress to dc home with family assist.    Follow Up Recommendations Home health PT    Equipment Recommendations  None recommended by PT    Recommendations for Other Services       Precautions / Restrictions Precautions Precautions: Fall Restrictions Weight Bearing Restrictions: No Other Position/Activity Restrictions: WBAT      Mobility  Bed Mobility               General bed mobility comments: deferred with BP at 196/78    Transfers                    Ambulation/Gait                Stairs            Wheelchair Mobility    Modified Rankin (Stroke Patients Only)       Balance                                             Pertinent Vitals/Pain Pain Assessment: 0-10 Pain Score: 4  Pain Location: L knee Pain Descriptors / Indicators: Aching;Sore Pain Intervention(s): Limited activity within patient's tolerance;Premedicated before session;Monitored during session;Ice applied    Home Living Family/patient expects to be discharged to:: Private residence Living Arrangements: Alone Available Help at Discharge: Family;Available 24 hours/day Type of Home: House Home Access: Stairs to enter Entrance Stairs-Rails: None Entrance Stairs-Number of Steps: 2 Home Layout: Able to live on main level with bedroom/bathroom Home Equipment: Walker - 2 wheels;Cane - single point;Bedside commode;Shower seat      Prior Function Level of Independence: Independent               Hand Dominance         Extremity/Trunk Assessment   Upper Extremity Assessment Upper Extremity Assessment: Overall WFL for tasks assessed    Lower Extremity Assessment Lower Extremity Assessment: LLE deficits/detail LLE Deficits / Details: IND SLR, AAROM at knee 0 - 40    Cervical / Trunk Assessment Cervical / Trunk Assessment: Normal  Communication   Communication: No difficulties  Cognition Arousal/Alertness: Awake/alert Behavior During Therapy: WFL for tasks assessed/performed Overall Cognitive Status: Within Functional Limits for tasks assessed                                        General Comments      Exercises Total Joint Exercises Ankle Circles/Pumps: AROM;Both;15 reps;Supine Quad Sets: AROM;Both;5 reps;Supine Heel Slides: AAROM;Left;5 reps;Supine Straight Leg Raises: AAROM;AROM;Left;10 reps;Supine   Assessment/Plan    PT Assessment Patient needs continued PT services  PT Problem List Decreased strength;Decreased range of motion;Decreased activity tolerance;Decreased balance;Decreased mobility;Decreased knowledge of use of DME;Pain       PT Treatment Interventions DME instruction;Gait training;Stair training;Functional mobility training;Therapeutic activities;Therapeutic exercise;Patient/family education    PT Goals (Current goals can be  found in the Care Plan section)  Acute Rehab PT Goals Patient Stated Goal: REgain IND PT Goal Formulation: With patient Time For Goal Achievement: 10/24/20 Potential to Achieve Goals: Good    Frequency 7X/week   Barriers to discharge        Co-evaluation               AM-PAC PT "6 Clicks" Mobility  Outcome Measure Help needed turning from your back to your side while in a flat bed without using bedrails?: A Little Help needed moving from lying on your back to sitting on the side of a flat bed without using bedrails?: A Little Help needed moving to and from a bed to a chair (including a wheelchair)?: A Little Help  needed standing up from a chair using your arms (e.g., wheelchair or bedside chair)?: A Little Help needed to walk in hospital room?: A Lot Help needed climbing 3-5 steps with a railing? : A Lot 6 Click Score: 16    End of Session Equipment Utilized During Treatment: Gait belt Activity Tolerance: Other (comment) (BP elevated) Patient left: in bed;with call bell/phone within reach Nurse Communication: Mobility status PT Visit Diagnosis: Muscle weakness (generalized) (M62.81);Difficulty in walking, not elsewhere classified (R26.2);Pain Pain - Right/Left: Left Pain - part of body: Knee    Time: 5465-0354 PT Time Calculation (min) (ACUTE ONLY): 22 min   Charges:   PT Evaluation $PT Eval Low Complexity: 1 Low          [place] Pager [phone] Office [phone]   Shontel Santee 10/17/2020, 3:04 PM

## 2020-10-17 NOTE — Interval H&P Note (Signed)
History and Physical Interval Note:  10/17/2020 7:16 AM  Maria Rowland  has presented today for surgery, with the diagnosis of LEFT KNEE DEGENERATIVE JOINT DISEASE.  The various methods of treatment have been discussed with the patient and family. After consideration of risks, benefits and other options for treatment, the patient has consented to  Procedure(s): TOTAL KNEE ARTHROPLASTY (Left) as a surgical intervention.  The patient's history has been reviewed, patient examined, no change in status, stable for surgery.  I have reviewed the patient's chart and labs.  Questions were answered to the patient's satisfaction.     Alta Corning

## 2020-10-17 NOTE — Anesthesia Procedure Notes (Signed)
Anesthesia Regional Block: Adductor canal block   Pre-Anesthetic Checklist: ,, timeout performed, Correct Patient, Correct Site, Correct Laterality, Correct Procedure, Correct Position, site marked, Risks and benefits discussed,  Surgical consent,  Pre-op evaluation,  At surgeon's request and post-op pain management  Laterality: Left  Prep: chloraprep       Needles:  Injection technique: Single-shot  Needle Type: Echogenic Needle     Needle Length: 9cm  Needle Gauge: 21     Additional Needles:   Procedures:,,,, ultrasound used (permanent image in chart),,,,  Narrative:  Start time: 10/17/2020 7:00 AM End time: 10/17/2020 7:06 AM Injection made incrementally with aspirations every 5 mL.  Performed by: Personally  Anesthesiologist: Suzette Battiest, MD

## 2020-10-17 NOTE — Anesthesia Postprocedure Evaluation (Signed)
Anesthesia Post Note  Patient: Maria Rowland  Procedure(s) Performed: TOTAL KNEE ARTHROPLASTY (Left Knee)     Patient location during evaluation: PACU Anesthesia Type: Spinal Level of consciousness: awake and alert Pain management: pain level controlled Vital Signs Assessment: post-procedure vital signs reviewed and stable Respiratory status: spontaneous breathing and respiratory function stable Cardiovascular status: blood pressure returned to baseline and stable Postop Assessment: spinal receding Anesthetic complications: no   No complications documented.  Last Vitals:  Vitals:   10/17/20 1500 10/17/20 1558  BP: (!) 163/81 (!) 182/81  Pulse: 77 67  Resp: 16 16  Temp: 36.6 C 37 C  SpO2: 98% 98%    Last Pain:  Vitals:   10/17/20 1607  TempSrc:   PainSc: 5                  Tiajuana Amass

## 2020-10-18 DIAGNOSIS — Z96651 Presence of right artificial knee joint: Secondary | ICD-10-CM | POA: Diagnosis not present

## 2020-10-18 DIAGNOSIS — Z96649 Presence of unspecified artificial hip joint: Secondary | ICD-10-CM | POA: Diagnosis not present

## 2020-10-18 DIAGNOSIS — I10 Essential (primary) hypertension: Secondary | ICD-10-CM | POA: Diagnosis not present

## 2020-10-18 DIAGNOSIS — M1712 Unilateral primary osteoarthritis, left knee: Secondary | ICD-10-CM | POA: Diagnosis not present

## 2020-10-18 DIAGNOSIS — Z79899 Other long term (current) drug therapy: Secondary | ICD-10-CM | POA: Diagnosis not present

## 2020-10-18 DIAGNOSIS — Z20822 Contact with and (suspected) exposure to covid-19: Secondary | ICD-10-CM | POA: Diagnosis not present

## 2020-10-18 LAB — CBC
HCT: 33.3 % — ABNORMAL LOW (ref 36.0–46.0)
Hemoglobin: 11 g/dL — ABNORMAL LOW (ref 12.0–15.0)
MCH: 31.6 pg (ref 26.0–34.0)
MCHC: 33 g/dL (ref 30.0–36.0)
MCV: 95.7 fL (ref 80.0–100.0)
Platelets: 270 10*3/uL (ref 150–400)
RBC: 3.48 MIL/uL — ABNORMAL LOW (ref 3.87–5.11)
RDW: 12.8 % (ref 11.5–15.5)
WBC: 13 10*3/uL — ABNORMAL HIGH (ref 4.0–10.5)
nRBC: 0 % (ref 0.0–0.2)

## 2020-10-18 MED ORDER — HYDROCODONE-ACETAMINOPHEN 5-325 MG PO TABS
1.0000 | ORAL_TABLET | Freq: Four times a day (QID) | ORAL | Status: DC | PRN
Start: 2020-10-18 — End: 2020-10-19
  Administered 2020-10-19: 1 via ORAL
  Filled 2020-10-18: qty 1

## 2020-10-18 NOTE — Progress Notes (Signed)
Physical Therapy Treatment Patient Details Name: Maria Rowland MRN: [id number] DOB: 09-10-1940 Today's Date: 10/18/2020    History of Present Illness P s/p L TKR and with hx of R TKR and THR    PT Comments    Pt very cooperative and able to perform therex program with assist and ambulate ltd distance in hall but limited by fatigue and pain.     Follow Up Recommendations  Home health PT     Equipment Recommendations  None recommended by PT    Recommendations for Other Services       Precautions / Restrictions Precautions Precautions: Fall Restrictions Weight Bearing Restrictions: No LLE Weight Bearing: Weight bearing as tolerated    Mobility  Bed Mobility Overal bed mobility: Needs Assistance Bed Mobility: Supine to Sit     Supine to sit: Min assist     General bed mobility comments: cues for sequence and assist to manage L LE    Transfers Overall transfer level: Needs assistance Equipment used: Rolling walker (2 wheeled) Transfers: Sit to/from Stand Sit to Stand: Min assist         General transfer comment: cues for LE management and use of UEs to self assist  Ambulation/Gait Ambulation/Gait assistance: Min assist Gait Distance (Feet): 31 Feet Assistive device: Rolling walker (2 wheeled) Gait Pattern/deviations: Step-to pattern;Decreased step length - right;Decreased step length - left;Shuffle;Trunk flexed Gait velocity: decr   General Gait Details: Increased time with cues for sequence, posture and position from RW.   Stairs             Wheelchair Mobility    Modified Rankin (Stroke Patients Only)       Balance Overall balance assessment: Needs assistance Sitting-balance support: Feet supported;No upper extremity supported Sitting balance-Leahy Scale: Fair     Standing balance support: Bilateral upper extremity supported Standing balance-Leahy Scale: Poor                              Cognition Arousal/Alertness:  Awake/alert Behavior During Therapy: WFL for tasks assessed/performed Overall Cognitive Status: Within Functional Limits for tasks assessed                                        Exercises Total Joint Exercises Ankle Circles/Pumps: AROM;Both;15 reps;Supine Quad Sets: AROM;Both;Supine;10 reps Heel Slides: AAROM;Left;Supine;15 reps Straight Leg Raises: AAROM;AROM;Left;10 reps;Supine Goniometric ROM: AAROM L knee -5 - 40    General Comments        Pertinent Vitals/Pain Pain Assessment: 0-10 Pain Score: 6  Pain Location: L knee Pain Descriptors / Indicators: Aching;Sore Pain Intervention(s): Limited activity within patient's tolerance;Monitored during session;Premedicated before session;Ice applied    Home Living                      Prior Function            PT Goals (current goals can now be found in the care plan section) Acute Rehab PT Goals Patient Stated Goal: REgain IND PT Goal Formulation: With patient Time For Goal Achievement: 10/24/20 Potential to Achieve Goals: Good Progress towards PT goals: Progressing toward goals    Frequency    7X/week      PT Plan Current plan remains appropriate    Co-evaluation              AM-PAC PT "6  Clicks" Mobility   Outcome Measure  Help needed turning from your back to your side while in a flat bed without using bedrails?: A Little Help needed moving from lying on your back to sitting on the side of a flat bed without using bedrails?: A Little Help needed moving to and from a bed to a chair (including a wheelchair)?: A Little Help needed standing up from a chair using your arms (e.g., wheelchair or bedside chair)?: A Little Help needed to walk in hospital room?: A Little Help needed climbing 3-5 steps with a railing? : A Lot 6 Click Score: 17    End of Session Equipment Utilized During Treatment: Gait belt Activity Tolerance: Patient limited by fatigue;Patient limited by  pain Patient left: in chair;with call bell/phone within reach;with family/visitor present Nurse Communication: Mobility status PT Visit Diagnosis: Muscle weakness (generalized) (M62.81);Difficulty in walking, not elsewhere classified (R26.2);Pain Pain - Right/Left: Left Pain - part of body: Knee     Time: 0939-1010 PT Time Calculation (min) (ACUTE ONLY): 31 min  Charges:  $Gait Training: 8-22 mins $Therapeutic Exercise: 8-22 mins                     [place] Pager [phone] Office [phone]    Renda Pohlman 10/18/2020, 1:32 PM

## 2020-10-18 NOTE — TOC Transition Note (Signed)
Transition of Care [facility]) - CM/SW Discharge Note   Patient Details  Name: Karishma Unrein MRN: [id number] Date of Birth: 09-05-1940  Transition of Care [facility]) CM/SW Contact:  Ross Ludwig, LCSW Phone Number: 10/18/2020, 12:47 PM   Clinical Narrative:     Patient will be going home with home health through [place].  CSW signing off please reconsult with any other social work needs, home health agency has been notified of planned discharge.   Final next level of care: [place] Barriers to Discharge: Barriers Resolved   Patient Goals and CMS Choice Patient states their goals for this hospitalization and ongoing recovery are:: To return back home with home health. CMS Medicare.gov Compare Post Acute Care list provided to:: Patient Choice offered to / list presented to : Patient  Discharge Placement                       Discharge Plan and Services                DME Arranged: CPM,Walker rolling DME Agency: [employer]       HH Arranged: PT HH Agency: [place] Date [place]: 10/17/20 Time HH Agency Contacted: [phone]    Social Determinants of Health (SDOH) Interventions     Readmission Risk Interventions No flowsheet data found.

## 2020-10-18 NOTE — Progress Notes (Signed)
Physical Therapy Treatment Patient Details Name: Maria Rowland MRN: [id number] DOB: 06/29/1940 Today's Date: 10/18/2020    History of Present Illness P s/p L TKR and with hx of R TKR and THR    PT Comments    Pt agreeable to bed therex and performed same with increased time and assist but OOB deferred 2* pt fatigue and pain - RN aware.   Follow Up Recommendations  Home health PT     Equipment Recommendations  None recommended by PT    Recommendations for Other Services       Precautions / Restrictions Precautions Precautions: Fall Restrictions Weight Bearing Restrictions: No LLE Weight Bearing: Weight bearing as tolerated    Mobility  Bed Mobility               General bed mobility comments: deferred 2* pt pain and fatigue    Transfers                    Ambulation/Gait                 Stairs             Wheelchair Mobility    Modified Rankin (Stroke Patients Only)       Balance                                            Cognition Arousal/Alertness: Awake/alert Behavior During Therapy: WFL for tasks assessed/performed Overall Cognitive Status: Within Functional Limits for tasks assessed                                        Exercises Total Joint Exercises Ankle Circles/Pumps: AROM;Both;15 reps;Supine Quad Sets: AROM;Both;Supine;10 reps Heel Slides: AAROM;Left;Supine;10 reps Straight Leg Raises: AAROM;Left;10 reps;Supine Goniometric ROM: AAROM L knee -5 - 25    General Comments        Pertinent Vitals/Pain Pain Assessment: 0-10 Pain Score: 9  Pain Location: L knee Pain Descriptors / Indicators: Aching;Sore Pain Intervention(s): Limited activity within patient's tolerance;Monitored during session;Premedicated before session;Ice applied    Home Living                      Prior Function            PT Goals (current goals can now be found in the care plan section)  Acute Rehab PT Goals Patient Stated Goal: REgain IND PT Goal Formulation: With patient Time For Goal Achievement: 10/24/20 Potential to Achieve Goals: Good Progress towards PT goals: Progressing toward goals (ltd by pain and fatigue)    Frequency    7X/week      PT Plan Current plan remains appropriate    Co-evaluation              AM-PAC PT "6 Clicks" Mobility   Outcome Measure  Help needed turning from your back to your side while in a flat bed without using bedrails?: A Lot Help needed moving from lying on your back to sitting on the side of a flat bed without using bedrails?: A Lot Help needed moving to and from a bed to a chair (including a wheelchair)?: A Lot Help needed standing up from a chair using your arms (e.g., wheelchair or bedside chair)?: A Lot Help  needed to walk in hospital room?: A Lot Help needed climbing 3-5 steps with a railing? : A Lot 6 Click Score: 12    End of Session   Activity Tolerance: Patient limited by fatigue;Patient limited by pain Patient left: in bed;with call bell/phone within reach;with family/visitor present Nurse Communication: Mobility status PT Visit Diagnosis: Muscle weakness (generalized) (M62.81);Difficulty in walking, not elsewhere classified (R26.2);Pain Pain - Right/Left: Left Pain - part of body: Knee     Time: 1447-1510 PT Time Calculation (min) (ACUTE ONLY): 23 min  Charges:  $Therapeutic Exercise: 8-22 mins                     Debe Coder PT Acute Rehabilitation Services Pager [phone] Office [phone]    Joseantonio Dittmar 10/18/2020, 4:02 PM

## 2020-10-18 NOTE — Progress Notes (Signed)
    Patient doing well PO Day 1 S/P L TKA by Dr Berenice Primas team. Pain well controlled. Has been up and to bathroom. Awaiting PT. Normal bowel and bladder function. Eating well. Without complaint.   Physical Exam: Vitals:   10/18/20 0121 10/18/20 0554  BP: (!) 169/92 (!) 151/70  Pulse: (!) 59 66  Resp: 14 16  Temp: 97.7 F (36.5 C) 98.2 F (36.8 C)  SpO2: 96% 96%    Dressing in place, CDI, distal compartments soft, pt resting comfortably sitting up in bed with family at bedside NVI  POD #1 s/p L TKA by Dr Berenice Primas, doing well  - up with PT/OT, encourage ambulation - Percocet for pain, tizanidine for muscle spasms, ASA 325 for anticoagulation, sent in to pharmacy electronically - likely d/c home today with f/u in 2 weeks

## 2020-10-18 NOTE — Progress Notes (Signed)
Called [place], [place] to inform that patient was not discharged today due to slow progression with PT and confusion.  Received verbal order from [facility], [place] to D/C the Oxycodone and put order in for Norco 5-325 mg q 6 hr PRN. Will continue to monitor patient.

## 2020-10-18 NOTE — Plan of Care (Signed)
  Problem: Activity: Goal: Range of joint motion will improve Outcome: Progressing   Problem: Clinical Measurements: Goal: Postoperative complications will be avoided or minimized Outcome: Progressing   Problem: Pain Management: Goal: Pain level will decrease with appropriate interventions Outcome: Progressing   

## 2020-10-19 DIAGNOSIS — Z20822 Contact with and (suspected) exposure to covid-19: Secondary | ICD-10-CM | POA: Diagnosis not present

## 2020-10-19 DIAGNOSIS — M1712 Unilateral primary osteoarthritis, left knee: Secondary | ICD-10-CM | POA: Diagnosis not present

## 2020-10-19 DIAGNOSIS — Z96649 Presence of unspecified artificial hip joint: Secondary | ICD-10-CM | POA: Diagnosis not present

## 2020-10-19 DIAGNOSIS — Z96651 Presence of right artificial knee joint: Secondary | ICD-10-CM | POA: Diagnosis not present

## 2020-10-19 DIAGNOSIS — I10 Essential (primary) hypertension: Secondary | ICD-10-CM | POA: Diagnosis not present

## 2020-10-19 DIAGNOSIS — Z79899 Other long term (current) drug therapy: Secondary | ICD-10-CM | POA: Diagnosis not present

## 2020-10-19 LAB — CBC
HCT: 30.2 % — ABNORMAL LOW (ref 36.0–46.0)
Hemoglobin: 10.3 g/dL — ABNORMAL LOW (ref 12.0–15.0)
MCH: 32.1 pg (ref 26.0–34.0)
MCHC: 34.1 g/dL (ref 30.0–36.0)
MCV: 94.1 fL (ref 80.0–100.0)
Platelets: 238 10*3/uL (ref 150–400)
RBC: 3.21 MIL/uL — ABNORMAL LOW (ref 3.87–5.11)
RDW: 12.7 % (ref 11.5–15.5)
WBC: 11.6 10*3/uL — ABNORMAL HIGH (ref 4.0–10.5)
nRBC: 0 % (ref 0.0–0.2)

## 2020-10-19 MED ORDER — HYDROCODONE-ACETAMINOPHEN 5-325 MG PO TABS: 1.0000 | ORAL_TABLET | Freq: Four times a day (QID) | ORAL | 0 refills | Status: AC | PRN

## 2020-10-19 NOTE — Progress Notes (Signed)
The patient is alert and oriented and has been seen by her physician. The orders for discharge were written. IV has been removed. Went over discharge instructions with patient and family. She is being discharged via wheelchair with all of her belongings.  

## 2020-10-19 NOTE — Progress Notes (Signed)
Patient alert and oriented this morning.

## 2020-10-19 NOTE — Progress Notes (Signed)
Physical Therapy Treatment Patient Details Name: Maria Rowland MRN: [id number] DOB: 04/04/1941 Today's Date: 10/19/2020    History of Present Illness P s/p L TKR and with hx of R TKR and THR    PT Comments    Pt very cooperative and with marked improvement in activity tolerance but continues to progress slowly.   Follow Up Recommendations  Home health PT     Equipment Recommendations  None recommended by PT    Recommendations for Other Services       Precautions / Restrictions Precautions Precautions: Fall Restrictions Weight Bearing Restrictions: No LLE Weight Bearing: Weight bearing as tolerated    Mobility  Bed Mobility               General bed mobility comments: Pt on BSC at beginning of session and up in chair at end    Transfers Overall transfer level: Needs assistance Equipment used: Rolling walker (2 wheeled) Transfers: Sit to/from Stand Sit to Stand: Min assist         General transfer comment: cues for LE management and use of UEs to self assist  Ambulation/Gait Ambulation/Gait assistance: Min assist Gait Distance (Feet): 48 Feet Assistive device: Rolling walker (2 wheeled) Gait Pattern/deviations: Step-to pattern;Decreased step length - right;Decreased step length - left;Shuffle;Trunk flexed Gait velocity: decr   General Gait Details: Increased time with cues for sequence, posture and position from RW.   Stairs             Wheelchair Mobility    Modified Rankin (Stroke Patients Only)       Balance Overall balance assessment: Needs assistance Sitting-balance support: Feet supported;No upper extremity supported Sitting balance-Leahy Scale: Good     Standing balance support: Bilateral upper extremity supported Standing balance-Leahy Scale: Poor                              Cognition Arousal/Alertness: Awake/alert Behavior During Therapy: WFL for tasks assessed/performed Overall Cognitive Status: Within  Functional Limits for tasks assessed                                        Exercises Total Joint Exercises Ankle Circles/Pumps: AROM;Both;15 reps;Supine Quad Sets: AROM;Both;Supine;15 reps Heel Slides: AAROM;Left;Supine;15 reps Straight Leg Raises: AAROM;Left;Supine;20 reps Goniometric ROM: AAROM L knee -5 - 40    General Comments        Pertinent Vitals/Pain Pain Assessment: 0-10 Pain Score: 6  Pain Location: L knee Pain Descriptors / Indicators: Aching;Sore Pain Intervention(s): Limited activity within patient's tolerance;Monitored during session;Premedicated before session;Ice applied    Home Living                      Prior Function            PT Goals (current goals can now be found in the care plan section) Acute Rehab PT Goals Patient Stated Goal: REgain IND PT Goal Formulation: With patient Time For Goal Achievement: 10/24/20 Potential to Achieve Goals: Good Progress towards PT goals: Progressing toward goals    Frequency    7X/week      PT Plan Current plan remains appropriate    Co-evaluation              AM-PAC PT "6 Clicks" Mobility   Outcome Measure  Help needed turning from your back to your  side while in a flat bed without using bedrails?: A Lot Help needed moving from lying on your back to sitting on the side of a flat bed without using bedrails?: A Lot Help needed moving to and from a bed to a chair (including a wheelchair)?: A Little Help needed standing up from a chair using your arms (e.g., wheelchair or bedside chair)?: A Little Help needed to walk in hospital room?: A Little Help needed climbing 3-5 steps with a railing? : A Lot 6 Click Score: 15    End of Session Equipment Utilized During Treatment: Gait belt Activity Tolerance: Patient limited by fatigue Patient left: in chair;with call bell/phone within reach;with chair alarm set;with family/visitor present Nurse Communication: Mobility  status PT Visit Diagnosis: Muscle weakness (generalized) (M62.81);Difficulty in walking, not elsewhere classified (R26.2);Pain Pain - Right/Left: Left Pain - part of body: Knee     Time: 5170-0174 PT Time Calculation (min) (ACUTE ONLY): 28 min  Charges:  $Gait Training: 8-22 mins $Therapeutic Exercise: 8-22 mins                     Debe Coder PT Acute Rehabilitation Services Pager [phone] Office [phone]    Maria Rowland 10/19/2020, 2:12 PM

## 2020-10-19 NOTE — Progress Notes (Signed)
    Patient doing well PO Day 2 S/P L TKA by Dr Berenice Primas team. Pain well controlled. Has been up and to bathroom. Has been up with PT and improved today, less sedation and not "feeling crazy" after downgrading to norco. Normal bowel and bladder function. Eating well. Without complaint    Physical Exam: Vitals:   10/19/20 0349 10/19/20 1324  BP: (!) 179/81 129/60  Pulse: 78 73  Resp: 20 16  Temp: 98.7 F (37.1 C) (!) 97.5 F (36.4 C)  SpO2: 94% 95%    Dressing in place, CDI, distal compartments soft, pt resting comfortably sitting up in chair NVI  POD #2 s/p L TKA by Dr Berenice Primas, doing well and improved with downgrade to hydrocodone  - up with PT/OT, encourage ambulation - norco for pain, tizanidine for muscle spasms, ASA 325 for anticoagulation, sent in to pharmacy electronically - likely d/c home today with f/u in 2 weeks

## 2020-10-19 NOTE — Progress Notes (Signed)
Physical Therapy Treatment Patient Details Name: Maria Rowland MRN: [id number] DOB: [birth year] Today's Date: 10/19/2020    History of Present Illness P s/p L TKR and with hx of R TKR and THR    PT Comments    Pt continues progressing with mobility with noted increased level of alertness, increased stability with gait and transfers and increased activity tolerance.  Pt up to EOB unassisted, ambulated increased distance in hall with decreased assist and reviewed limited HEP - HHPT will start 5/31 per dtr.  Pt declines to attempt stairs stating she has a ramp and a wc her dtr will use to get her into the house.  Pt eager for dc home this date.     Follow Up Recommendations  Home health PT     Equipment Recommendations  None recommended by PT    Recommendations for Other Services       Precautions / Restrictions Precautions Precautions: Fall Restrictions Weight Bearing Restrictions: No LLE Weight Bearing: Weight bearing as tolerated    Mobility  Bed Mobility Overal bed mobility: Needs Assistance Bed Mobility: Supine to Sit     Supine to sit: Min guard     General bed mobility comments: Increased time with cues for sequence and self assisting L LE with gait belt    Transfers Overall transfer level: Needs assistance Equipment used: Rolling walker (2 wheeled) Transfers: Sit to/from Stand Sit to Stand: Min guard;Supervision         General transfer comment: cues for LE management and use of UEs to self assist  Ambulation/Gait Ambulation/Gait assistance: Min guard;Supervision Gait Distance (Feet): 100 Feet Assistive device: Rolling walker (2 wheeled) Gait Pattern/deviations: Step-to pattern;Decreased step length - right;Decreased step length - left;Shuffle;Trunk flexed Gait velocity: decr   General Gait Details: Increased time with min cues for sequence, posture and position from RW.   Stairs             Wheelchair Mobility    Modified Rankin (Stroke  Patients Only)       Balance Overall balance assessment: Needs assistance Sitting-balance support: Feet supported;No upper extremity supported Sitting balance-Leahy Scale: Good     Standing balance support: No upper extremity supported Standing balance-Leahy Scale: Fair                              Cognition Arousal/Alertness: Awake/alert Behavior During Therapy: WFL for tasks assessed/performed Overall Cognitive Status: Within Functional Limits for tasks assessed                                        Exercises Total Joint Exercises Ankle Circles/Pumps: AROM;Both;15 reps;Supine Quad Sets: AROM;Supine;15 reps;5 reps Heel Slides: AAROM;Left;Supine;5 reps Straight Leg Raises: AAROM;Left;Supine;5 reps    General Comments        Pertinent Vitals/Pain Pain Assessment: 0-10 Pain Score: 4  Pain Location: L knee Pain Descriptors / Indicators: Aching;Sore Pain Intervention(s): Limited activity within patient's tolerance;Monitored during session    Home Living                      Prior Function            PT Goals (current goals can now be found in the care plan section) Acute Rehab PT Goals Patient Stated Goal: REgain IND PT Goal Formulation: With patient Time For Goal Achievement: 10/24/20  Potential to Achieve Goals: Good Progress towards PT goals: Progressing toward goals    Frequency    7X/week      PT Plan Current plan remains appropriate    Co-evaluation              AM-PAC PT "6 Clicks" Mobility   Outcome Measure  Help needed turning from your back to your side while in a flat bed without using bedrails?: A Little Help needed moving from lying on your back to sitting on the side of a flat bed without using bedrails?: A Little Help needed moving to and from a bed to a chair (including a wheelchair)?: A Little Help needed standing up from a chair using your arms (e.g., wheelchair or bedside chair)?: A  Little Help needed to walk in hospital room?: A Little Help needed climbing 3-5 steps with a railing? : A Lot 6 Click Score: 17    End of Session Equipment Utilized During Treatment: Gait belt Activity Tolerance: Patient tolerated treatment well Patient left: in chair;with call bell/phone within reach;with chair alarm set;with family/visitor present Nurse Communication: Mobility status PT Visit Diagnosis: Muscle weakness (generalized) (M62.81);Difficulty in walking, not elsewhere classified (R26.2);Pain Pain - Right/Left: Left Pain - part of body: Knee     Time: 9233-0076 PT Time Calculation (min) (ACUTE ONLY): 32 min  Charges:  $Gait Training: 8-22 mins $Therapeutic Activity: 8-22 mins                     [place] Pager [phone] Office [phone]    Karlo Goeden 10/19/2020, 4:03 PM

## 2020-10-19 NOTE — Plan of Care (Signed)
  Problem: Education: Goal: Knowledge of General Education information will improve Description: Including pain rating scale, medication(s)/side effects and non-pharmacologic comfort measures Outcome: Progressing   Problem: Health Behavior/Discharge Planning: Goal: Ability to manage health-related needs will improve Outcome: Progressing   Problem: Activity: Goal: Risk for activity intolerance will decrease Outcome: Progressing   

## 2020-10-21 ENCOUNTER — Encounter (HOSPITAL_COMMUNITY): Payer: Self-pay | Admitting: Orthopedic Surgery

## 2020-10-21 DIAGNOSIS — Z9049 Acquired absence of other specified parts of digestive tract: Secondary | ICD-10-CM | POA: Diagnosis not present

## 2020-10-21 DIAGNOSIS — Z96649 Presence of unspecified artificial hip joint: Secondary | ICD-10-CM | POA: Diagnosis not present

## 2020-10-21 DIAGNOSIS — Z9089 Acquired absence of other organs: Secondary | ICD-10-CM | POA: Diagnosis not present

## 2020-10-21 DIAGNOSIS — M19012 Primary osteoarthritis, left shoulder: Secondary | ICD-10-CM | POA: Diagnosis not present

## 2020-10-21 DIAGNOSIS — Z96653 Presence of artificial knee joint, bilateral: Secondary | ICD-10-CM | POA: Diagnosis not present

## 2020-10-21 DIAGNOSIS — Z471 Aftercare following joint replacement surgery: Secondary | ICD-10-CM | POA: Diagnosis not present

## 2020-10-21 DIAGNOSIS — M169 Osteoarthritis of hip, unspecified: Secondary | ICD-10-CM | POA: Diagnosis not present

## 2020-10-21 DIAGNOSIS — M19011 Primary osteoarthritis, right shoulder: Secondary | ICD-10-CM | POA: Diagnosis not present

## 2020-10-21 DIAGNOSIS — I10 Essential (primary) hypertension: Secondary | ICD-10-CM | POA: Diagnosis not present

## 2020-10-21 DIAGNOSIS — Z7982 Long term (current) use of aspirin: Secondary | ICD-10-CM | POA: Diagnosis not present

## 2020-10-23 DIAGNOSIS — Z96653 Presence of artificial knee joint, bilateral: Secondary | ICD-10-CM | POA: Diagnosis not present

## 2020-10-23 DIAGNOSIS — I10 Essential (primary) hypertension: Secondary | ICD-10-CM | POA: Diagnosis not present

## 2020-10-23 DIAGNOSIS — M19012 Primary osteoarthritis, left shoulder: Secondary | ICD-10-CM | POA: Diagnosis not present

## 2020-10-23 DIAGNOSIS — Z471 Aftercare following joint replacement surgery: Secondary | ICD-10-CM | POA: Diagnosis not present

## 2020-10-23 DIAGNOSIS — M169 Osteoarthritis of hip, unspecified: Secondary | ICD-10-CM | POA: Diagnosis not present

## 2020-10-23 DIAGNOSIS — M19011 Primary osteoarthritis, right shoulder: Secondary | ICD-10-CM | POA: Diagnosis not present

## 2020-10-23 DIAGNOSIS — Z7982 Long term (current) use of aspirin: Secondary | ICD-10-CM | POA: Diagnosis not present

## 2020-10-23 DIAGNOSIS — Z9089 Acquired absence of other organs: Secondary | ICD-10-CM | POA: Diagnosis not present

## 2020-10-23 DIAGNOSIS — Z96649 Presence of unspecified artificial hip joint: Secondary | ICD-10-CM | POA: Diagnosis not present

## 2020-10-23 DIAGNOSIS — Z9049 Acquired absence of other specified parts of digestive tract: Secondary | ICD-10-CM | POA: Diagnosis not present

## 2020-10-27 DIAGNOSIS — Z9049 Acquired absence of other specified parts of digestive tract: Secondary | ICD-10-CM | POA: Diagnosis not present

## 2020-10-27 DIAGNOSIS — Z96649 Presence of unspecified artificial hip joint: Secondary | ICD-10-CM | POA: Diagnosis not present

## 2020-10-27 DIAGNOSIS — M169 Osteoarthritis of hip, unspecified: Secondary | ICD-10-CM | POA: Diagnosis not present

## 2020-10-27 DIAGNOSIS — M19011 Primary osteoarthritis, right shoulder: Secondary | ICD-10-CM | POA: Diagnosis not present

## 2020-10-27 DIAGNOSIS — M19012 Primary osteoarthritis, left shoulder: Secondary | ICD-10-CM | POA: Diagnosis not present

## 2020-10-27 DIAGNOSIS — Z96653 Presence of artificial knee joint, bilateral: Secondary | ICD-10-CM | POA: Diagnosis not present

## 2020-10-27 DIAGNOSIS — Z9089 Acquired absence of other organs: Secondary | ICD-10-CM | POA: Diagnosis not present

## 2020-10-27 DIAGNOSIS — I10 Essential (primary) hypertension: Secondary | ICD-10-CM | POA: Diagnosis not present

## 2020-10-27 DIAGNOSIS — Z471 Aftercare following joint replacement surgery: Secondary | ICD-10-CM | POA: Diagnosis not present

## 2020-10-27 DIAGNOSIS — Z7982 Long term (current) use of aspirin: Secondary | ICD-10-CM | POA: Diagnosis not present

## 2020-10-28 DIAGNOSIS — Z9049 Acquired absence of other specified parts of digestive tract: Secondary | ICD-10-CM | POA: Diagnosis not present

## 2020-10-28 DIAGNOSIS — M19011 Primary osteoarthritis, right shoulder: Secondary | ICD-10-CM | POA: Diagnosis not present

## 2020-10-28 DIAGNOSIS — Z9089 Acquired absence of other organs: Secondary | ICD-10-CM | POA: Diagnosis not present

## 2020-10-28 DIAGNOSIS — I10 Essential (primary) hypertension: Secondary | ICD-10-CM | POA: Diagnosis not present

## 2020-10-28 DIAGNOSIS — Z7982 Long term (current) use of aspirin: Secondary | ICD-10-CM | POA: Diagnosis not present

## 2020-10-28 DIAGNOSIS — M19012 Primary osteoarthritis, left shoulder: Secondary | ICD-10-CM | POA: Diagnosis not present

## 2020-10-28 DIAGNOSIS — M169 Osteoarthritis of hip, unspecified: Secondary | ICD-10-CM | POA: Diagnosis not present

## 2020-10-28 DIAGNOSIS — Z471 Aftercare following joint replacement surgery: Secondary | ICD-10-CM | POA: Diagnosis not present

## 2020-10-28 DIAGNOSIS — Z96653 Presence of artificial knee joint, bilateral: Secondary | ICD-10-CM | POA: Diagnosis not present

## 2020-10-28 DIAGNOSIS — Z96649 Presence of unspecified artificial hip joint: Secondary | ICD-10-CM | POA: Diagnosis not present

## 2020-10-29 DIAGNOSIS — Z9089 Acquired absence of other organs: Secondary | ICD-10-CM | POA: Diagnosis not present

## 2020-10-29 DIAGNOSIS — Z96653 Presence of artificial knee joint, bilateral: Secondary | ICD-10-CM | POA: Diagnosis not present

## 2020-10-29 DIAGNOSIS — Z471 Aftercare following joint replacement surgery: Secondary | ICD-10-CM | POA: Diagnosis not present

## 2020-10-29 DIAGNOSIS — I10 Essential (primary) hypertension: Secondary | ICD-10-CM | POA: Diagnosis not present

## 2020-10-29 DIAGNOSIS — M19012 Primary osteoarthritis, left shoulder: Secondary | ICD-10-CM | POA: Diagnosis not present

## 2020-10-29 DIAGNOSIS — Z96649 Presence of unspecified artificial hip joint: Secondary | ICD-10-CM | POA: Diagnosis not present

## 2020-10-29 DIAGNOSIS — M19011 Primary osteoarthritis, right shoulder: Secondary | ICD-10-CM | POA: Diagnosis not present

## 2020-10-29 DIAGNOSIS — M169 Osteoarthritis of hip, unspecified: Secondary | ICD-10-CM | POA: Diagnosis not present

## 2020-10-29 DIAGNOSIS — Z9049 Acquired absence of other specified parts of digestive tract: Secondary | ICD-10-CM | POA: Diagnosis not present

## 2020-10-29 DIAGNOSIS — Z7982 Long term (current) use of aspirin: Secondary | ICD-10-CM | POA: Diagnosis not present

## 2020-10-30 ENCOUNTER — Other Ambulatory Visit: Payer: Self-pay

## 2020-10-30 ENCOUNTER — Encounter: Payer: Self-pay | Admitting: Physical Therapy

## 2020-10-30 ENCOUNTER — Ambulatory Visit: Payer: Medicare HMO | Attending: Orthopedic Surgery | Admitting: Physical Therapy

## 2020-10-30 DIAGNOSIS — M25662 Stiffness of left knee, not elsewhere classified: Secondary | ICD-10-CM

## 2020-10-30 DIAGNOSIS — M25562 Pain in left knee: Secondary | ICD-10-CM | POA: Diagnosis not present

## 2020-10-30 DIAGNOSIS — R2689 Other abnormalities of gait and mobility: Secondary | ICD-10-CM

## 2020-10-30 DIAGNOSIS — M6281 Muscle weakness (generalized): Secondary | ICD-10-CM

## 2020-10-30 DIAGNOSIS — Z471 Aftercare following joint replacement surgery: Secondary | ICD-10-CM | POA: Diagnosis not present

## 2020-10-30 DIAGNOSIS — R600 Localized edema: Secondary | ICD-10-CM | POA: Diagnosis present

## 2020-10-30 DIAGNOSIS — Z96652 Presence of left artificial knee joint: Secondary | ICD-10-CM | POA: Diagnosis not present

## 2020-10-30 NOTE — Therapy (Signed)
[facility] [place], [place], [address] Phone: [phone]   Fax:  [phone]  Physical Therapy Evaluation  Patient Details  Name: Maria Rowland MRN: [id number] Date of Birth: 1940/08/08 Referring Provider (PT): Dorna Leitz, MD   Encounter Date: 10/30/2020   PT End of Session - 10/30/20 1710     Visit Number 1    Number of Visits 17    Date for PT Re-Evaluation 12/25/20    Authorization Type [employer] & [employer]    PT Start Time 3419   pt late   PT Stop Time 1609    PT Time Calculation (min) 30 min    Activity Tolerance Patient tolerated treatment well;Patient limited by pain    Behavior During Therapy [facility] for tasks assessed/performed             Past Medical History:  Diagnosis Date   Constipation    Dysrhythmia    papitations   HTN (hypertension)    Irregular heart beat    Osteoarthritis    lt shoulder,knees, hips,   S/P total knee arthroplasty    Shortness of breath    when she has palpitations    Past Surgical History:  Procedure Laterality Date   APPENDECTOMY     CARPAL TUNNEL RELEASE     CHOLECYSTECTOMY     COLONOSCOPY     EYE SURGERY Right    cataract with lens implant   hip replacement 2011     surgical repair of left hand     TONSILLECTOMY     TOTAL KNEE ARTHROPLASTY Right 04/09/2013   Procedure: TOTAL KNEE ARTHROPLASTY;  Surgeon: Vickey Huger, MD;  Location: [place];  Service: Orthopedics;  Laterality: Right;   TOTAL KNEE ARTHROPLASTY Left 10/17/2020   Procedure: TOTAL KNEE ARTHROPLASTY;  Surgeon: Dorna Leitz, MD;  Location: [facility];  Service: Orthopedics;  Laterality: Left;    There were no vitals filed for this visit.    Subjective Assessment - 10/30/20 1542     Subjective Patient reports undergoing L TKA on 10/17/20. Had HHPT for 5 visits. Ambulating with RW comfortably indoors/outdoors. Having trouble bending her knee. Feels that now that she is back on her meds,  her pain is better-controlled now than before. Wearing ted hose in AM d/t remaining swelling per MD's advise today. Denies fever, chills, night sweats, calf pain.    Pertinent History SOB, R TKA 2014, OA, dysrhythmia, HTN, surgical repair of L hand, hip replacement 2011, carpal tunnel release    Limitations Sitting;Lifting;Standing;Walking;House hold activities    Diagnostic tests none recent    Patient Stated Goals decrease pain, increase motion    Currently in Pain? Yes    Pain Score 7     Pain Location Knee    Pain Orientation Left;Posterior;Anterior    Pain Descriptors / Indicators Aching    Pain Type Acute pain;Surgical pain                [facility] PT Assessment - 10/30/20 1546       Assessment   Medical Diagnosis s/p L TKA    Referring Provider (PT) Dorna Leitz, MD    Onset Date/Surgical Date 10/17/20    Next MD Visit 11/20/20    Prior Therapy yes      Precautions   Precautions None      Balance Screen   Has the patient fallen in the past 6 months No    Has the  patient had a decrease in activity level because of a fear of falling?  No    Is the patient reluctant to leave their home because of a fear of falling?  No      Home Environment   Living Environment Private residence    Living Arrangements Alone    Available Help at Discharge Family    Type of [place] to enter;Ramped entrance    Entrance Stairs-Number of Steps 2    Entrance Stairs-Rails None    Home Layout Two level;Able to live on main level with bedroom/bathroom    Alternate Level Stairs-Number of Steps 15    Alternate Level Stairs-Rails Right    Home Equipment Walker - 2 wheels;Cane - single point;Grab bars - tub/shower;Shower seat      Prior Function   Level of Independence Independent    Vocation Retired    Warehouse manager   Overall Cognitive Status Within Functional Limits for tasks assessed      Observation/Other Assessments   Observations B ted  hose donned., L knee covered in bandage and dressing; moderate L knee & lower leg edema      Sensation   Light Touch Appears Intact      Coordination   Gross Motor Movements are Fluid and Coordinated Yes      Posture/Postural Control   Posture/Postural Control Postural limitations    Postural Limitations Rounded Shoulders;Forward head      ROM / Strength   AROM / PROM / Strength AROM;PROM;Strength      AROM   AROM Assessment Site Knee    Right/Left Knee Right;Left    Right Knee Extension 0    Right Knee Flexion 120    Left Knee Extension 4    Left Knee Flexion 55      PROM   PROM Assessment Site Knee    Right/Left Knee Right;Left    Right Knee Extension -2    Right Knee Flexion 124    Left Knee Extension 2    Left Knee Flexion 60      Strength   Strength Assessment Site Hip;Knee;Ankle    Right/Left Hip Right;Left    Right Hip Flexion 4+/5    Right Hip ABduction 4/5    Right Hip ADduction 4/5    Left Hip Flexion 4/5    Left Hip ABduction 4-/5    Left Hip ADduction 4-/5    Right/Left Knee Right;Left    Right Knee Flexion 4+/5    Right Knee Extension 4+/5    Left Knee Flexion 4-/5    Left Knee Extension 3+/5    Right/Left Ankle Right;Left    Right Ankle Dorsiflexion 4+/5    Right Ankle Plantar Flexion 4+/5    Left Ankle Dorsiflexion 4/5    Left Ankle Plantar Flexion 4-/5      Palpation   Patella mobility limited by dressing    Palpation comment TTP over L distal anterior thigh; B calf tight but nontender and without redness/warmth      Ambulation/Gait   Assistive device Rolling walker    Gait Pattern Step-to pattern;Step-through pattern;Decreased stance time - left;Decreased step length - right;Decreased hip/knee flexion - left;Decreased weight shift to left;Poor foot clearance - left;Poor foot clearance - right;Trunk flexed    Ambulation Surface Level;Indoor    Gait velocity decreased  Objective measurements completed  on examination: See above findings.               PT Education - 10/30/20 1710     Education Details prognosis, POC, HEP    Person(s) Educated Patient    Methods Explanation;Demonstration;Tactile cues;Verbal cues;Handout    Comprehension Verbalized understanding              PT Short Term Goals - 10/30/20 1715       PT SHORT TERM GOAL #1   Title Patient to be independent with initial HEP.    Time 3    Period Weeks    Status New    Target Date 11/20/20               PT Long Term Goals - 10/30/20 1715       PT LONG TERM GOAL #1   Title Patient to be independent with advanced HEP.    Time 8    Period Weeks    Status New    Target Date 12/25/20      PT LONG TERM GOAL #2   Title Patient to demonstrate  L knee AROM/PROM [facility].    Time 8    Period Weeks    Status New    Target Date 12/25/20      PT LONG TERM GOAL #3   Title Patient to demonstrate >=4+/5 strength in B LEs.    Time 8    Period Weeks    Status New    Target Date 12/25/20      PT LONG TERM GOAL #4   Title Patient to demonstrate reciprocal stair climbing up/down 13 steps with 1 handrail without pain limiting and good quad stability.    Time 8    Period Weeks    Status New    Target Date 12/25/20      PT LONG TERM GOAL #5   Title Patient to demonstrate adequate step length, heel-toe pattern, and knee flexion with ambulation with LRAD.    Time 8    Period Weeks    Status New    Target Date 12/25/20                    Plan - 10/30/20 1711     Clinical Impression Statement Patient is a 80 y/o F presenting to OPPT with c/o L knee pain s/p L TKA on 10/17/20. Patient reports having had HHPT and is now ambulating with RW. Notes marked difficulty with knee flexion. Denies fever, chills, night sweats, calf pain. Patient would like to return to gardening without pain. Patient today presenting with rounded shoulders and forward head posture, limited L knee ROM, decreased B LE  strength, TTP over L distal anterior thigh, B calf tightness and L LE edema but LE nontender and without redness/warmth, and gait deviations. Patient was educated on gentle ROM and strengthening HEP- patient reported understanding. Would benefit from skilled PT services 2x/week for 8 weeks to address aforementioned impairments.    Personal Factors and Comorbidities Age;Comorbidity 3+;Past/Current Experience;Time since onset of injury/illness/exacerbation    Comorbidities SOB, R TKA 2014, OA, dysrhythmia, HTN, surgical repair of L hand, hip replacement 2011, carpal tunnel release    Examination-Activity Limitations Bed Mobility;Bend;Squat;Sleep;Sit;Stairs;Stand;Carry;Transfers;Dressing;Hygiene/Grooming;Lift;Locomotion Level    Examination-Participation Restrictions Church;Cleaning;Shop;Community Activity;Driving;Yard Work;Laundry;Meal Prep    Stability/Clinical Decision Making Stable/Uncomplicated    Clinical Decision Making Low    Rehab Potential Good    PT Frequency 2x / week  PT Duration 8 weeks    PT Treatment/Interventions ADLs/Self Care Home Management;Cryotherapy;Electrical Stimulation;Iontophoresis 4mg /ml Dexamethasone;Moist Heat;Balance training;Therapeutic exercise;Therapeutic activities;Functional mobility training;Stair training;Gait training;Ultrasound;DME Instruction;Neuromuscular re-education;Patient/family education;Manual techniques;Vasopneumatic Device;Taping;Energy conservation;Dry needling;Passive range of motion;Scar mobilization    PT Next Visit Plan knee FOTO; reassess HEP; progress knee flexion ROM    Consulted and Agree with Plan of Care Patient             Patient will benefit from skilled therapeutic intervention in order to improve the following deficits and impairments:  Abnormal gait, Hypomobility, Increased edema, Decreased scar mobility, Decreased activity tolerance, Decreased strength, Increased fascial restricitons, Pain, Increased muscle spasms, Difficulty  walking, Decreased balance, Decreased range of motion, Improper body mechanics, Postural dysfunction, Impaired flexibility  Visit Diagnosis: Acute pain of left knee  Stiffness of left knee, not elsewhere classified  Muscle weakness (generalized)  Localized edema  Other abnormalities of gait and mobility     Problem List Patient Active Problem List   Diagnosis Date Noted   Primary osteoarthritis of left knee 10/17/2020   Status post total left knee replacement 10/17/2020   Status post total knee replacement, left 10/17/2020   S/P total knee arthroplasty    HTN (hypertension)    Osteoarthritis    PVC (premature ventricular contraction) 03/13/2013   Other specified cardiac dysrhythmias(427.89) 03/13/2013     Janene Harvey, PT, DPT 10/30/20 5:20 PM    [facility] [address]  [place], [place], [address] Phone: [phone]   Fax:  [phone]  Name: Maria Rowland MRN: [id number] Date of Birth: 10/12/1940

## 2020-11-05 ENCOUNTER — Other Ambulatory Visit: Payer: Self-pay

## 2020-11-05 ENCOUNTER — Ambulatory Visit: Payer: Medicare HMO | Admitting: Physical Therapy

## 2020-11-05 ENCOUNTER — Encounter: Payer: Self-pay | Admitting: Physical Therapy

## 2020-11-05 DIAGNOSIS — M6281 Muscle weakness (generalized): Secondary | ICD-10-CM

## 2020-11-05 DIAGNOSIS — M25662 Stiffness of left knee, not elsewhere classified: Secondary | ICD-10-CM

## 2020-11-05 DIAGNOSIS — R2689 Other abnormalities of gait and mobility: Secondary | ICD-10-CM

## 2020-11-05 DIAGNOSIS — R600 Localized edema: Secondary | ICD-10-CM | POA: Diagnosis not present

## 2020-11-05 DIAGNOSIS — M25562 Pain in left knee: Secondary | ICD-10-CM | POA: Diagnosis not present

## 2020-11-05 NOTE — Therapy (Signed)
[facility] [address]  [place], [place], [address] Phone: [phone]   Fax:  [phone]  Physical Therapy Treatment  Patient Details  Name: Maria Rowland MRN: [id number] Date of Birth: 1941/01/21 Referring Provider (PT): Dorna Leitz, MD   Encounter Date: 11/05/2020   PT End of Session - 11/05/20 1655     Visit Number 2    Number of Visits 17    Date for PT Re-Evaluation 12/25/20    Authorization Type [employer] Medicare & [employer]    PT Start Time 1615    PT Stop Time 1704    PT Time Calculation (min) 49 min    Activity Tolerance Patient tolerated treatment well;Patient limited by pain    Behavior During Therapy [facility] for tasks assessed/performed             Past Medical History:  Diagnosis Date   Constipation    Dysrhythmia    papitations   HTN (hypertension)    Irregular heart beat    Osteoarthritis    lt shoulder,knees, hips,   S/P total knee arthroplasty    Shortness of breath    when she has palpitations    Past Surgical History:  Procedure Laterality Date   APPENDECTOMY     CARPAL TUNNEL RELEASE     CHOLECYSTECTOMY     COLONOSCOPY     EYE SURGERY Right    cataract with lens implant   hip replacement 2011     surgical repair of left hand     TONSILLECTOMY     TOTAL KNEE ARTHROPLASTY Right 04/09/2013   Procedure: TOTAL KNEE ARTHROPLASTY;  Surgeon: Vickey Huger, MD;  Location: [facility];  Service: Orthopedics;  Laterality: Right;   TOTAL KNEE ARTHROPLASTY Left 10/17/2020   Procedure: TOTAL KNEE ARTHROPLASTY;  Surgeon: Dorna Leitz, MD;  Location: [facility];  Service: Orthopedics;  Laterality: Left;    There were no vitals filed for this visit.   Subjective Assessment - 11/05/20 1617     Subjective Transitioned herself to the [facility] today. Feeling discouraged with her knee stiffness. Notes that she had to cut herself out of her ted hose d/t swelling. Denies episodes of L knee buckling.    Pertinent  History SOB, R TKA 2014, OA, dysrhythmia, HTN, surgical repair of L hand, hip replacement 2011, carpal tunnel release    Diagnostic tests none recent    Patient Stated Goals decrease pain, increase motion    Currently in Pain? Yes    Pain Score 6     Pain Location Knee    Pain Orientation Left    Pain Descriptors / Indicators Aching    Pain Type Acute pain;Surgical pain                [facility] PT Assessment - 11/05/20 0001       Observation/Other Assessments   Focus on Therapeutic Outcomes (FOTO)  Knee: 24                           [facility] Adult PT Treatment/Exercise - 11/05/20 0001       Exercises   Exercises Knee/Hip      Knee/Hip Exercises: Stretches   Hip Flexor Stretch Left;2 reps;30 seconds    Hip Flexor Stretch Limitations mod thomas with strap    Other Knee/Hip Stretches L knee flexion stretch on 6" step 10x5"      Knee/Hip Exercises: Aerobic   Nustep  L1 x 6 min (UEs/LEs)      Knee/Hip Exercises: Standing   Knee Flexion AROM;Right;Left;1 set;10 reps    Knee Flexion Limitations HS curl at counter    Functional Squat 1 set;10 reps    Functional Squat Limitations mini squat   cues to avoid excessive UE use     Knee/Hip Exercises: Supine   Quad Sets Strengthening;Left;1 set;10 reps    Quad Sets Limitations 10x5" with folded pillow under knee   cues to concentrate on quad vs core contraction   Heel Slides AAROM;Left;1 set;10 reps    Heel Slides Limitations 10x3" with orange pball and strap   c/o more pain with LEs extended   Straight Leg Raises Strengthening;Left;1 set;10 reps    Straight Leg Raises Limitations very mild quad lag      Modalities   Modalities Vasopneumatic      Vasopneumatic   Number Minutes Vasopneumatic  10 minutes    Vasopnuematic Location  Knee   L   Vasopneumatic Pressure Low    Vasopneumatic Temperature  --   coldest                   PT Education - 11/05/20 1655     Education Details encouraged patient to  continue ambulation with [facility] for short distances but to continue using RW for longer distances    Person(s) Educated Patient    Methods Explanation    Comprehension Verbalized understanding              PT Short Term Goals - 11/05/20 1656       PT SHORT TERM GOAL #1   Title Patient to be independent with initial HEP.    Time 3    Period Weeks    Status On-going    Target Date 11/20/20               PT Long Term Goals - 11/05/20 1656       PT LONG TERM GOAL #1   Title Patient to be independent with advanced HEP.    Time 8    Period Weeks    Status On-going      PT LONG TERM GOAL #2   Title Patient to demonstrate  L knee AROM/PROM WFL.    Time 8    Period Weeks    Status On-going      PT LONG TERM GOAL #3   Title Patient to demonstrate >=4+/5 strength in B LEs.    Time 8    Period Weeks    Status On-going      PT LONG TERM GOAL #4   Title Patient to demonstrate reciprocal stair climbing up/down 13 steps with 1 handrail without pain limiting and good quad stability.    Time 8    Period Weeks    Status On-going      PT LONG TERM GOAL #5   Title Patient to demonstrate adequate step length, heel-toe pattern, and knee flexion with ambulation with LRAD.    Time 8    Period Weeks    Status On-going                   Plan - 11/05/20 1656     Clinical Impression Statement Patient arrived to session ambulating with [facility], noting that she transitioned to the [facility] today. Denies episodes of L knee buckling. Thus, adjusted SPC height for max safety and encouraged patient to continue ambulation with [facility] for short distances as  she appears safe and stable, however to continue using RW for longer distances. Reviewed HEP and provided intermittent cues for correction of form. Patient demonstrates mild quad lag remaining with SLR's. L knee flexion ROM appeared slightly improved with heel slides today. Initiated standing ther-ex for ROM and LE strength, which patient  tolerated well. Ended session with [employer] for pain and edema. Patient without complaints at end of session.    Comorbidities SOB, R TKA 2014, OA, dysrhythmia, HTN, surgical repair of L hand, hip replacement 2011, carpal tunnel release    PT Treatment/Interventions ADLs/Self Care Home Management;Cryotherapy;Electrical Stimulation;Iontophoresis 4mg /ml Dexamethasone;Moist Heat;Balance training;Therapeutic exercise;Therapeutic activities;Functional mobility training;Stair training;Gait training;Ultrasound;DME Instruction;Neuromuscular re-education;Patient/family education;Manual techniques;Vasopneumatic Device;Taping;Energy conservation;Dry needling;Passive range of motion;Scar mobilization    PT Next Visit Plan progress knee flexion ROM and LE strength    Consulted and Agree with Plan of Care Patient             Patient will benefit from skilled therapeutic intervention in order to improve the following deficits and impairments:  Abnormal gait, Hypomobility, Increased edema, Decreased scar mobility, Decreased activity tolerance, Decreased strength, Increased fascial restricitons, Pain, Increased muscle spasms, Difficulty walking, Decreased balance, Decreased range of motion, Improper body mechanics, Postural dysfunction, Impaired flexibility  Visit Diagnosis: Acute pain of left knee  Stiffness of left knee, not elsewhere classified  Muscle weakness (generalized)  Localized edema  Other abnormalities of gait and mobility     Problem List Patient Active Problem List   Diagnosis Date Noted   Primary osteoarthritis of left knee 10/17/2020   Status post total left knee replacement 10/17/2020   Status post total knee replacement, left 10/17/2020   S/P total knee arthroplasty    HTN (hypertension)    Osteoarthritis    PVC (premature ventricular contraction) 03/13/2013   Other specified cardiac dysrhythmias(427.89) 03/13/2013     Janene Harvey, PT, DPT 11/05/20 5:07  PM    [facility] [address]  [place], [place], [address] Phone: [phone]   Fax:  [phone]  Name: Maria Rowland MRN: [id number] Date of Birth: [birth year]

## 2020-11-06 NOTE — Discharge Summary (Signed)
Patient ID: Maria Rowland MRN: [id number] DOB/AGE: 1940/11/29 80 y.o.  Admit date: 10/17/2020 Discharge date: 10/19/2020  Admission Diagnoses:  Principal Problem:   Primary osteoarthritis of left knee Active Problems:   Status post total left knee replacement   Status post total knee replacement, left   Discharge Diagnoses:  Same  Past Medical History:  Diagnosis Date   Constipation    Dysrhythmia    papitations   HTN (hypertension)    Irregular heart beat    Osteoarthritis    lt shoulder,knees, hips,   S/P total knee arthroplasty    Shortness of breath    when she has palpitations    Surgeries: Procedure(s): TOTAL KNEE ARTHROPLASTY on 10/17/2020   Consultants: None  Discharged Condition: Improved  Hospital Course: Maria Rowland is an 80 y.o. female who was admitted 10/17/2020 for operative treatment of Primary osteoarthritis of left knee. Patient has severe unremitting pain that affects sleep, daily activities, and work/hobbies. After pre-op clearance the patient was taken to the operating room on 10/17/2020 and underwent  Procedure(s): TOTAL KNEE ARTHROPLASTY.    Patient was given perioperative antibiotics:  Anti-infectives (From admission, onward)    Start     Dose/Rate Route Frequency Ordered Stop   10/17/20 1400  ceFAZolin (ANCEF) IVPB 1 g/50 mL premix        1 g 100 mL/hr over 30 Minutes Intravenous Every 6 hours 10/17/20 0927 10/17/20 2102   10/17/20 0600  ceFAZolin (ANCEF) IVPB 2g/100 mL premix        2 g 200 mL/hr over 30 Minutes Intravenous On call to O.R. 10/17/20 8182 10/17/20 0737        Patient was given sequential compression devices, early ambulation to prevent DVT.  Patient benefited maximally from hospital stay and there were no complications.    Recent vital signs: BP 129/60 (BP Location: Left Arm)   Pulse 73   Temp (!) 97.5 F (36.4 C)   Resp 16   Wt 73 kg   SpO2 95%   BMI 28.07 kg/m    Discharge Medications:   Allergies as of  10/19/2020       Reactions   Meloxicam Other (See Comments)   Transaminitis  Increases liver enzymes   Caffeine Palpitations   Rapid heartrate        Medication List     TAKE these medications    acetaminophen 650 MG CR tablet Commonly known as: TYLENOL Take 1,300 mg by mouth 2 (two) times daily.   amoxicillin 500 MG capsule Commonly known as: AMOXIL Take 2,000 mg by mouth See admin instructions. Take 2000 mg 1 hour prior to dental work   aspirin EC 325 MG tablet Take 1 tablet (325 mg total) by mouth 2 (two) times daily after a meal.   CALCIUM 500 + D PO Take 2 tablets by mouth in the morning and at bedtime.   CENTRUM SILVER PO Take 1 tablet by mouth daily.   docusate sodium 100 MG capsule Commonly known as: Colace Take 1 capsule (100 mg total) by mouth 2 (two) times daily.   fluticasone 50 MCG/ACT nasal spray Commonly known as: FLONASE Place 1 spray into both nostrils daily as needed for allergies or rhinitis.   HYDROcodone-acetaminophen 5-325 MG tablet Commonly known as: NORCO/VICODIN Take 1-2 tablets by mouth every 6 (six) hours as needed for moderate pain or severe pain.   LUBRICATING EYE DROPS OP Place 1 drop into both eyes daily as needed (dryness / allergies).  METAMUCIL PO Take 1 Dose by mouth every evening. 1 dose = 1 teaspoon   metoprolol succinate 100 MG 24 hr tablet Commonly known as: TOPROL-XL Take 50-100 mg by mouth See admin instructions. Take 100 mg in the morning and 50 mg in the evening   tiZANidine 4 MG tablet Commonly known as: ZANAFLEX Take 4 mg by mouth every 8 (eight) hours as needed for muscle spasms. What changed: Another medication with the same name was added. Make sure you understand how and when to take each.   tiZANidine 4 MG tablet Commonly known as: Zanaflex Take 1 tablet (4 mg total) by mouth 3 (three) times daily. What changed: You were already taking a medication with the same name, and this prescription was added.  Make sure you understand how and when to take each.               Discharge Care Instructions  (From admission, onward)           Start     Ordered   10/17/20 0000  Leave dressing on - Keep it clean, dry, and intact until clinic visit        10/17/20 2206            Diagnostic Studies: DG Chest 2 View  Result Date: 10/10/2020 CLINICAL DATA:  80 year old female with pending left knee surgery EXAM: CHEST - 2 VIEW COMPARISON:  05/29/2017 FINDINGS: Cardiomediastinal silhouette unchanged in size and contour. No evidence of central vascular congestion. No interlobular septal thickening. No pneumothorax or pleural effusion. Coarsened interstitial markings, with no confluent airspace disease. No acute displaced fracture IMPRESSION: Negative for acute cardiopulmonary disease Electronically Signed   By: Corrie Mckusick D.O.   On: 10/10/2020 09:00    Disposition: Discharge disposition: 01-Home or Self Care       Discharge Instructions     Call MD for:  difficulty breathing, headache or visual disturbances   Complete by: As directed    Call MD for:  persistant nausea and vomiting   Complete by: As directed    Call MD for:  severe uncontrolled pain   Complete by: As directed    Call MD for:  temperature >100.4   Complete by: As directed    Diet - low sodium heart healthy   Complete by: As directed    Discharge instructions   Complete by: As directed    [place] items at home which could result in a fall. This includes throw rugs or furniture in walking pathways ICE to the affected joint every three hours while awake for 30 minutes at a time, for at least the first 3-5 days, and then as needed for pain and swelling.  Continue to use ice for pain and swelling. You may notice swelling that will progress down to the foot and ankle.  This is normal after surgery.  Elevate your leg when you are not up walking on it.   Continue to use the  breathing machine you got in the hospital (incentive spirometer) which will help keep your temperature down.  It is common for your temperature to cycle up and down following surgery, especially at night when you are not up moving around and exerting yourself.  The breathing machine keeps your lungs expanded and your temperature down.   DIET:  As you were doing prior to hospitalization, we recommend a well-balanced diet.  DRESSING / WOUND CARE / SHOWERING  Keep the surgical dressing  until follow up.  The dressing is water proof, so you can shower without any extra covering.  IF THE DRESSING FALLS OFF or the wound gets wet inside, change the dressing with sterile gauze.  Please use good hand washing techniques before changing the dressing.  Do not use any lotions or creams on the incision until instructed by your surgeon.    ACTIVITY  Increase activity slowly as tolerated, but follow the weight bearing instructions below.   No driving for 6 weeks or until further direction given by your physician.  You cannot drive while taking narcotics.  No lifting or carrying greater than 10 lbs. until further directed by your surgeon. Avoid periods of inactivity such as sitting longer than an hour when not asleep. This helps prevent blood clots.  You may return to work once you are authorized by your doctor.     WEIGHT BEARING   Weight bearing as tolerated with assist device (walker, cane, etc) as directed, use it as long as suggested by your surgeon or therapist, typically at least 4-6 weeks.   EXERCISES  Results after joint replacement surgery are often greatly improved when you follow the exercise, range of motion and muscle strengthening exercises prescribed by your doctor. Safety measures are also important to protect the joint from further injury. Any time any of these exercises cause you to have increased pain or swelling, decrease what you are doing until you are comfortable again and then slowly  increase them. If you have problems or questions, call your caregiver or physical therapist for advice.   Rehabilitation is important following a joint replacement. After just a few days of immobilization, the muscles of the leg can become weakened and shrink (atrophy).  These exercises are designed to build up the tone and strength of the thigh and leg muscles and to improve motion. Often times heat used for twenty to thirty minutes before working out will loosen up your tissues and help with improving the range of motion but do not use heat for the first two weeks following surgery (sometimes heat can increase post-operative swelling).   These exercises can be done on a training (exercise) mat, on the floor, on a table or on a bed. Use whatever works the best and is most comfortable for you.    Use music or television while you are exercising so that the exercises are a pleasant break in your day. This will make your life better with the exercises acting as a break in your routine that you can look forward to.   Perform all exercises about fifteen times, three times per day or as directed.  You should exercise both the operative leg and the other leg as well.  Exercises include:   Quad Sets - Tighten up the muscle on the front of the thigh (Quad) and hold for 5-10 seconds.   Straight Leg Raises - With your knee straight (if you were given a brace, keep it on), lift the leg to 60 degrees, hold for 3 seconds, and slowly lower the leg.  Perform this exercise against resistance later as your leg gets stronger.  Leg Slides: Lying on your back, slowly slide your foot toward your buttocks, bending your knee up off the floor (only go as far as is comfortable). Then slowly slide your foot back down until your leg is flat on the floor again.  Angel Wings: Lying on your back spread your legs to the side as far apart as you can  without causing discomfort.  Hamstring Strength:  Lying on your back, push your heel  against the floor with your leg straight by tightening up the muscles of your buttocks.  Repeat, but this time bend your knee to a comfortable angle, and push your heel against the floor.  You may put a pillow under the heel to make it more comfortable if necessary.   A rehabilitation program following joint replacement surgery can speed recovery and prevent re-injury in the future due to weakened muscles. Contact your doctor or a physical therapist for more information on knee rehabilitation.    CONSTIPATION  Constipation is defined medically as fewer than three stools per week and severe constipation as less than one stool per week.  Even if you have a regular bowel pattern at home, your normal regimen is likely to be disrupted due to multiple reasons following surgery.  Combination of anesthesia, postoperative narcotics, change in appetite and fluid intake all can affect your bowels.   YOU MUST use at least one of the following options; they are listed in order of increasing strength to get the job done.  They are all available over the counter, and you may need to use some, POSSIBLY even all of these options:    Drink plenty of fluids (prune juice may be helpful) and high fiber foods Colace 100 mg by mouth twice a day  Senokot for constipation as directed and as needed Dulcolax (bisacodyl), take with full glass of water  Miralax (polyethylene glycol) once or twice a day as needed.  If you have tried all these things and are unable to have a bowel movement in the first 3-4 days after surgery call either your surgeon or your primary doctor.    If you experience loose stools or diarrhea, hold the medications until you stool forms back up.  If your symptoms do not get better within 1 week or if they get worse, check with your doctor.  If you experience "the worst abdominal pain ever" or develop nausea or vomiting, please contact the office immediately for further recommendations for  treatment.   ITCHING:  If you experience itching with your medications, try taking only a single pain pill, or even half a pain pill at a time.  You can also use Benadryl over the counter for itching or also to help with sleep.   TED HOSE STOCKINGS:  Use stockings on both legs until for at least 2 weeks or as directed by physician office. They may be removed at night for sleeping.  MEDICATIONS:  See your medication summary on the "After Visit Summary" that nursing will review with you.  You may have some home medications which will be placed on hold until you complete the course of blood thinner medication.  It is important for you to complete the blood thinner medication as prescribed.  PRECAUTIONS:  If you experience chest pain or shortness of breath - call 911 immediately for transfer to the hospital emergency department.   If you develop a fever greater that 101 F, purulent drainage from wound, increased redness or drainage from wound, foul odor from the wound/dressing, or calf pain - CONTACT YOUR SURGEON.                                                   FOLLOW-UP APPOINTMENTS:  If  you do not already have a post-op appointment, please call the office for an appointment to be seen by your surgeon.  Guidelines for how soon to be seen are listed in your "After Visit Summary", but are typically between 1-4 weeks after surgery.  OTHER INSTRUCTIONS:   Knee Replacement:  Do not place pillow under knee, focus on keeping the knee straight while resting. CPM instructions: 0-90 degrees, 2 hours in the morning, 2 hours in the afternoon, and 2 hours in the evening. Place foam block, curve side up under heel at all times except when in CPM or when walking.  DO NOT modify, tear, cut, or change the foam block in any way.  POST-OPERATIVE OPIOID TAPER INSTRUCTIONS: It is important to wean off of your opioid medication as soon as possible. If you do not need pain medication after your surgery it is ok to stop  day one. Opioids include: Codeine, Hydrocodone(Norco, Vicodin), Oxycodone(Percocet, oxycontin) and hydromorphone amongst others.  Long term and even short term use of opiods can cause: Increased pain response Dependence Constipation Depression Respiratory depression And more.  Withdrawal symptoms can include Flu like symptoms Nausea, vomiting And more Techniques to manage these symptoms Hydrate well Eat regular healthy meals Stay active Use relaxation techniques(deep breathing, meditating, yoga) Do Not substitute Alcohol to help with tapering If you have been on opioids for less than two weeks and do not have pain than it is ok to stop all together.  Plan to wean off of opioids This plan should start within one week post op of your joint replacement. Maintain the same interval or time between taking each dose and first decrease the dose.  Cut the total daily intake of opioids by one tablet each day Next start to increase the time between doses. The last dose that should be eliminated is the evening dose.    Dental Antibiotics:  In most cases prophylactic antibiotics for Dental procdeures after total joint surgery are not necessary.  Exceptions are as follows:  1. History of prior total joint infection  2. Severely immunocompromised (Organ Transplant, cancer chemotherapy, Rheumatoid biologic meds such as [place])  3. Poorly controlled diabetes (A1C &gt; 8.0, blood glucose over 200)  If you have one of these conditions, contact your surgeon for an antibiotic prescription, prior to your dental procedure.    MAKE SURE YOU:  Understand these instructions.  Get help right away if you are not doing well or get worse.    Thank you for letting us be a part of your medical care team.  It is a privilege we respect greatly.  We hope these instructions will help you stay on track for a fast and full recovery!   Discharge patient   Complete by: As directed    Discharge  disposition: 01-Home or Self Care   Discharge patient date: 10/19/2020   Increase activity slowly   Complete by: As directed    Leave dressing on - Keep it clean, dry, and intact until clinic visit   Complete by: As directed       POD #2 s/p L TKA by Dr Berenice Primas, doing well and improved with downgrade to hydrocodone   - up with PT/OT, encourage ambulation - norco for pain, tizanidine for muscle spasms, ASA 325 for anticoagulation, sent in to pharmacy electronically -Scripts for pain sent to pharmacy electronically  -D/C instructions sheet printed and in chart -D/C today  -F/U in office 2 weeks   Signed: Lennie Muckle Garland [place]ouse 11/06/2020, 11:13  AM

## 2020-11-07 ENCOUNTER — Other Ambulatory Visit: Payer: Self-pay

## 2020-11-07 ENCOUNTER — Ambulatory Visit: Payer: Medicare HMO

## 2020-11-07 DIAGNOSIS — R2689 Other abnormalities of gait and mobility: Secondary | ICD-10-CM | POA: Diagnosis not present

## 2020-11-07 DIAGNOSIS — M25562 Pain in left knee: Secondary | ICD-10-CM | POA: Diagnosis not present

## 2020-11-07 DIAGNOSIS — R600 Localized edema: Secondary | ICD-10-CM | POA: Diagnosis not present

## 2020-11-07 DIAGNOSIS — M6281 Muscle weakness (generalized): Secondary | ICD-10-CM | POA: Diagnosis not present

## 2020-11-07 DIAGNOSIS — M25662 Stiffness of left knee, not elsewhere classified: Secondary | ICD-10-CM | POA: Diagnosis not present

## 2020-11-07 NOTE — Therapy (Signed)
[facility] [address]  [place], [place], [address] Phone: [phone]   Fax:  [phone]  Physical Therapy Treatment  Patient Details  Name: Maria Rowland MRN: [id number] Date of Birth: June 22, 1940 Referring Provider (PT): Dorna Leitz, MD   Encounter Date: 11/07/2020   PT End of Session - 11/07/20 1152     Visit Number 3    Number of Visits 17    Date for PT Re-Evaluation 12/25/20    Authorization Type [employer] & [employer]    PT Start Time 1103    PT Stop Time 1157    PT Time Calculation (min) 54 min    Activity Tolerance Patient tolerated treatment well;Patient limited by pain    Behavior During Therapy WFL for tasks assessed/performed             Past Medical History:  Diagnosis Date   Constipation    Dysrhythmia    papitations   HTN (hypertension)    Irregular heart beat    Osteoarthritis    lt shoulder,knees, hips,   S/P total knee arthroplasty    Shortness of breath    when she has palpitations    Past Surgical History:  Procedure Laterality Date   APPENDECTOMY     CARPAL TUNNEL RELEASE     CHOLECYSTECTOMY     COLONOSCOPY     EYE SURGERY Right    cataract with lens implant   hip replacement 2011     surgical repair of left hand     TONSILLECTOMY     TOTAL KNEE ARTHROPLASTY Right 04/09/2013   Procedure: TOTAL KNEE ARTHROPLASTY;  Surgeon: Vickey Huger, MD;  Location: [facility];  Service: Orthopedics;  Laterality: Right;   TOTAL KNEE ARTHROPLASTY Left 10/17/2020   Procedure: TOTAL KNEE ARTHROPLASTY;  Surgeon: Dorna Leitz, MD;  Location: [facility];  Service: Orthopedics;  Laterality: Left;    There were no vitals filed for this visit.   Subjective Assessment - 11/07/20 1107     Subjective Pt still reports sorenss in her knee with no pain. [facility] d/t fear of knee buckling.    Pertinent History SOB, R TKA 2014, OA, dysrhythmia, HTN, surgical repair of L hand, hip  replacement 2011, carpal tunnel release    Diagnostic tests none recent    Patient Stated Goals decrease pain, increase motion    Currently in Pain? No/denies   "soreness"               [facility] PT Assessment - 11/07/20 0001       AROM   Left Knee Extension 4    Left Knee Flexion 85                           [facility] Adult PT Treatment/Exercise - 11/07/20 0001       Ambulation/Gait   Ambulation/Gait Yes    Ambulation Distance (Feet) 60 Feet    Assistive device --   SPC   Gait Pattern Step-through pattern;Decreased stance time - left;Decreased step length - right;Decreased hip/knee flexion - left;Decreased weight shift to left;Trunk flexed    Gait Comments mild antalgic gait      Exercises   Exercises Knee/Hip      Knee/Hip Exercises: Aerobic   Nustep L3x32min      Knee/Hip Exercises: Standing   Knee Flexion AROM;Both;10 reps    Knee Flexion Limitations HS curl at counter  Hip Abduction Stengthening;Both;10 reps    Functional Squat 10 reps    Functional Squat Limitations mini squat; cues for proper foot positioning      Knee/Hip Exercises: Seated   Ball Squeeze 10x3"    Sit to Sand 10 reps;with UE support;without UE support   intermitten UE support; cues to increase anterior WS     Knee/Hip Exercises: Supine   Quad Sets Strengthening;Left    Quad Sets Limitations 10x5" with towel under knee; 5 reps with B ball squeeze; instruction needed to facilitate quad contraction    Heel Slides AAROM;Left;2 sets;10 reps    Heel Slides Limitations 20x3" with strap      Modalities   Modalities Vasopneumatic      Vasopneumatic   Number Minutes Vasopneumatic  10 minutes    Vasopnuematic Location  Knee    Vasopneumatic Pressure Low    Vasopneumatic Temperature  34                      PT Short Term Goals - 11/05/20 1656       PT SHORT TERM GOAL #1   Title Patient to be independent with initial HEP.    Time 3    Period Weeks    Status  On-going    Target Date 11/20/20               PT Long Term Goals - 11/05/20 1656       PT LONG TERM GOAL #1   Title Patient to be independent with advanced HEP.    Time 8    Period Weeks    Status On-going      PT LONG TERM GOAL #2   Title Patient to demonstrate  L knee AROM/PROM WFL.    Time 8    Period Weeks    Status On-going      PT LONG TERM GOAL #3   Title Patient to demonstrate >=4+/5 strength in B LEs.    Time 8    Period Weeks    Status On-going      PT LONG TERM GOAL #4   Title Patient to demonstrate reciprocal stair climbing up/down 13 steps with 1 handrail without pain limiting and good quad stability.    Time 8    Period Weeks    Status On-going      PT LONG TERM GOAL #5   Title Patient to demonstrate adequate step length, heel-toe pattern, and knee flexion with ambulation with LRAD.    Time 8    Period Weeks    Status On-going                   Plan - 11/07/20 1154     Clinical Impression Statement Encouraged patient today to continue with use of SPC for short distances at home to improve gait mechanics and walk with a LRAD. She noted that she was hesitant to do so after last session d/t a fear of her L knee buckling although she denies this happen yet. Observed her walking through the clinic and she demonstrated good stability with mild deviation with the [facility]. L knee AROM was measured 4-85 deg today indicating steady improvement so far. She required instruction for proper foot positioning and posterior WS during the mini squats. Also needed instruction for adequate anterior WS during the STS. She compenstates during the session with less WS to the L and R LE bias. TC required for quad contraction during QS. Overall  she tolerated the exercises well, ended session with [place] to control pain and edema.    Personal Factors and Comorbidities Age;Comorbidity 3+;Past/Current Experience;Time since onset of injury/illness/exacerbation     Comorbidities SOB, R TKA 2014, OA, dysrhythmia, HTN, surgical repair of L hand, hip replacement 2011, carpal tunnel release    PT Frequency 2x / week    PT Duration 8 weeks    PT Treatment/Interventions ADLs/Self Care Home Management;Cryotherapy;Electrical Stimulation;Iontophoresis 4mg /ml Dexamethasone;Moist Heat;Balance training;Therapeutic exercise;Therapeutic activities;Functional mobility training;Stair training;Gait training;Ultrasound;DME Instruction;Neuromuscular re-education;Patient/family education;Manual techniques;Vasopneumatic Device;Taping;Energy conservation;Dry needling;Passive range of motion;Scar mobilization    PT Next Visit Plan progress knee flexion ROM and LE strength    Consulted and Agree with Plan of Care Patient             Patient will benefit from skilled therapeutic intervention in order to improve the following deficits and impairments:  Abnormal gait, Hypomobility, Increased edema, Decreased scar mobility, Decreased activity tolerance, Decreased strength, Increased fascial restricitons, Pain, Increased muscle spasms, Difficulty walking, Decreased balance, Decreased range of motion, Improper body mechanics, Postural dysfunction, Impaired flexibility  Visit Diagnosis: Acute pain of left knee  Stiffness of left knee, not elsewhere classified  Muscle weakness (generalized)  Localized edema  Other abnormalities of gait and mobility     Problem List Patient Active Problem List   Diagnosis Date Noted   Primary osteoarthritis of left knee 10/17/2020   Status post total left knee replacement 10/17/2020   Status post total knee replacement, left 10/17/2020   S/P total knee arthroplasty    HTN (hypertension)    Osteoarthritis    PVC (premature ventricular contraction) 03/13/2013   Other specified cardiac dysrhythmias(427.89) 03/13/2013    Artist Pais, PTA 11/07/2020, 12:04 PM  [facility] [address]  [place], [place], [address] Phone: [phone]   Fax:  [phone]  Name: Graycee Greeson MRN: [id number] Date of Birth: 07/01/1940

## 2020-11-11 ENCOUNTER — Other Ambulatory Visit: Payer: Self-pay

## 2020-11-11 ENCOUNTER — Ambulatory Visit: Payer: Medicare HMO | Admitting: Physical Therapy

## 2020-11-11 ENCOUNTER — Encounter: Payer: Self-pay | Admitting: Physical Therapy

## 2020-11-11 DIAGNOSIS — M25662 Stiffness of left knee, not elsewhere classified: Secondary | ICD-10-CM | POA: Diagnosis not present

## 2020-11-11 DIAGNOSIS — M25562 Pain in left knee: Secondary | ICD-10-CM | POA: Diagnosis not present

## 2020-11-11 DIAGNOSIS — R600 Localized edema: Secondary | ICD-10-CM | POA: Diagnosis not present

## 2020-11-11 DIAGNOSIS — M6281 Muscle weakness (generalized): Secondary | ICD-10-CM | POA: Diagnosis not present

## 2020-11-11 DIAGNOSIS — R2689 Other abnormalities of gait and mobility: Secondary | ICD-10-CM | POA: Diagnosis not present

## 2020-11-11 NOTE — Therapy (Signed)
[facility] [address]  [place], [place], [address] Phone: [phone]   Fax:  [phone]  Physical Therapy Treatment  Patient Details  Name: Maria Rowland MRN: [id number] Date of Birth: 1940-12-09 Referring Provider (PT): Dorna Leitz, MD   Encounter Date: 11/11/2020   PT End of Session - 11/11/20 1354     Visit Number 4    Number of Visits 17    Date for PT Re-Evaluation 12/25/20    Authorization Type [employer] & [employer]    PT Start Time 1275    PT Stop Time 1407    PT Time Calculation (min) 53 min    Activity Tolerance Patient tolerated treatment well;Patient limited by pain    Behavior During Therapy [facility] for tasks assessed/performed             Past Medical History:  Diagnosis Date   Constipation    Dysrhythmia    papitations   HTN (hypertension)    Irregular heart beat    Osteoarthritis    lt shoulder,knees, hips,   S/P total knee arthroplasty    Shortness of breath    when she has palpitations    Past Surgical History:  Procedure Laterality Date   APPENDECTOMY     CARPAL TUNNEL RELEASE     CHOLECYSTECTOMY     COLONOSCOPY     EYE SURGERY Right    cataract with lens implant   hip replacement 2011     surgical repair of left hand     TONSILLECTOMY     TOTAL KNEE ARTHROPLASTY Right 04/09/2013   Procedure: TOTAL KNEE ARTHROPLASTY;  Surgeon: Vickey Huger, MD;  Location: [place];  Service: Orthopedics;  Laterality: Right;   TOTAL KNEE ARTHROPLASTY Left 10/17/2020   Procedure: TOTAL KNEE ARTHROPLASTY;  Surgeon: Dorna Leitz, MD;  Location: [facility];  Service: Orthopedics;  Laterality: Left;    There were no vitals filed for this visit.   Subjective Assessment - 11/11/20 1316     Subjective Got some new ted hose and has been wearing them most of the time. Feels that her pain is getting better.    Pertinent History SOB, R TKA 2014, OA, dysrhythmia, HTN, surgical repair of L hand, hip  replacement 2011, carpal tunnel release    Diagnostic tests none recent    Patient Stated Goals decrease pain, increase motion    Currently in Pain? No/denies                               [facility] Adult PT Treatment/Exercise - 11/11/20 0001       Knee/Hip Exercises: Stretches   Other Knee/Hip Stretches L knee flexion stretch on 9" step 10x5"; L knee flexion stretch in sitting 5x5"   to tolerance     Knee/Hip Exercises: Aerobic   Recumbent Bike L1 x 6 min (partial revolutions)      Knee/Hip Exercises: Standing   Knee Flexion Strengthening;Left;10 reps;2 sets    Knee Flexion Limitations 1# HS curl at counter   cues to maintain chest up   Terminal Knee Extension Strengthening;Left;1 set;10 reps;Theraband    Theraband Level (Terminal Knee Extension) Level 4 (Blue)    Terminal Knee Extension Limitations 10x3" holding onto chair    Forward Step Up Left;1 set;10 reps;Hand Hold: 1;Step Height: 6"    Forward Step Up Limitations L step up/back   cues to avoid circumduction  Functional Squat 10 reps    Functional Squat Limitations mini squat      Knee/Hip Exercises: Seated   Sit to Sand without UE support;2 sets;5 reps   holding ball at chest     Vasopneumatic   Number Minutes Vasopneumatic  10 minutes    Vasopnuematic Location  Knee   L   Vasopneumatic Pressure Low    Vasopneumatic Temperature  34      Manual Therapy   Manual Therapy Joint mobilization    Manual therapy comments supine    Joint Mobilization L patellar jt mobs in all directions grade 3   hypomobility superior glides                   PT Education - 11/11/20 1354     Education Details update to HEP    Person(s) Educated Patient    Methods Explanation;Demonstration;Tactile cues;Verbal cues;Handout    Comprehension Verbalized understanding;Returned demonstration              PT Short Term Goals - 11/11/20 1358       PT SHORT TERM GOAL #1   Title Patient to be independent with  initial HEP.    Time 3    Period Weeks    Status Achieved    Target Date 11/20/20               PT Long Term Goals - 11/05/20 1656       PT LONG TERM GOAL #1   Title Patient to be independent with advanced HEP.    Time 8    Period Weeks    Status On-going      PT LONG TERM GOAL #2   Title Patient to demonstrate  L knee AROM/PROM WFL.    Time 8    Period Weeks    Status On-going      PT LONG TERM GOAL #3   Title Patient to demonstrate >=4+/5 strength in B LEs.    Time 8    Period Weeks    Status On-going      PT LONG TERM GOAL #4   Title Patient to demonstrate reciprocal stair climbing up/down 13 steps with 1 handrail without pain limiting and good quad stability.    Time 8    Period Weeks    Status On-going      PT LONG TERM GOAL #5   Title Patient to demonstrate adequate step length, heel-toe pattern, and knee flexion with ambulation with LRAD.    Time 8    Period Weeks    Status On-going                   Plan - 11/11/20 1357     Clinical Impression Statement Patient arrived to session with report of improving L knee pain as she is not having to take as much pain medicine. Worked on L patellar mobs with hypomobility demonstrated with superior glides. Increased challenge with STS transfers by having patient perform these transfers without UE support. Minor cueing required for proper set up, with good carryover. Knee flexion stretching was performed in sitting and standing with improved tolerance for standing version. Standing LE strengthening ther-ex was performed with good overall form and tolerance. Step ups were initiated with cues to avoid circumduction with good effort to correct and with fairly good knee stability. Ended session with [place] to L knee for post-exercise soreness. No complaints at end of session. Patient is progressing well towards goals.  Personal Factors and Comorbidities Age;Comorbidity 3+;Past/Current Experience;Time since  onset of injury/illness/exacerbation    Comorbidities SOB, R TKA 2014, OA, dysrhythmia, HTN, surgical repair of L hand, hip replacement 2011, carpal tunnel release    PT Frequency 2x / week    PT Duration 8 weeks    PT Treatment/Interventions ADLs/Self Care Home Management;Cryotherapy;Electrical Stimulation;Iontophoresis 4mg /ml Dexamethasone;Moist Heat;Balance training;Therapeutic exercise;Therapeutic activities;Functional mobility training;Stair training;Gait training;Ultrasound;DME Instruction;Neuromuscular re-education;Patient/family education;Manual techniques;Vasopneumatic Device;Taping;Energy conservation;Dry needling;Passive range of motion;Scar mobilization    PT Next Visit Plan progress knee flexion ROM and LE strength    Consulted and Agree with Plan of Care Patient             Patient will benefit from skilled therapeutic intervention in order to improve the following deficits and impairments:  Abnormal gait, Hypomobility, Increased edema, Decreased scar mobility, Decreased activity tolerance, Decreased strength, Increased fascial restricitons, Pain, Increased muscle spasms, Difficulty walking, Decreased balance, Decreased range of motion, Improper body mechanics, Postural dysfunction, Impaired flexibility  Visit Diagnosis: Acute pain of left knee  Stiffness of left knee, not elsewhere classified  Muscle weakness (generalized)  Localized edema  Other abnormalities of gait and mobility     Problem List Patient Active Problem List   Diagnosis Date Noted   Primary osteoarthritis of left knee 10/17/2020   Status post total left knee replacement 10/17/2020   Status post total knee replacement, left 10/17/2020   S/P total knee arthroplasty    HTN (hypertension)    Osteoarthritis    PVC (premature ventricular contraction) 03/13/2013   Other specified cardiac dysrhythmias(427.89) 03/13/2013     Janene Harvey, PT, DPT 11/11/20 2:39 PM   [facility] [address]  [place], [place], [address] Phone: [phone]   Fax:  [phone]  Name: Maria Rowland MRN: [id number] Date of Birth: 05-16-1941

## 2020-11-14 ENCOUNTER — Ambulatory Visit: Payer: Medicare HMO

## 2020-11-14 ENCOUNTER — Other Ambulatory Visit: Payer: Self-pay

## 2020-11-14 DIAGNOSIS — M25562 Pain in left knee: Secondary | ICD-10-CM | POA: Diagnosis not present

## 2020-11-14 DIAGNOSIS — R2689 Other abnormalities of gait and mobility: Secondary | ICD-10-CM | POA: Diagnosis not present

## 2020-11-14 DIAGNOSIS — M6281 Muscle weakness (generalized): Secondary | ICD-10-CM | POA: Diagnosis not present

## 2020-11-14 DIAGNOSIS — R600 Localized edema: Secondary | ICD-10-CM

## 2020-11-14 DIAGNOSIS — M25662 Stiffness of left knee, not elsewhere classified: Secondary | ICD-10-CM

## 2020-11-14 NOTE — Therapy (Signed)
[facility] [address]  [place], [place], [address] Phone: [phone]   Fax:  [phone]  Physical Therapy Treatment  Patient Details  Name: Jaynia Fendley MRN: [id number] Date of Birth: [birth year] Referring Provider (PT): Dorna Leitz, MD   Encounter Date: 11/14/2020   PT End of Session - 11/14/20 1103     Visit Number 5    Number of Visits 17    Date for PT Re-Evaluation 12/25/20    Authorization Type [employer] & [employer]    PT Start Time 1018    PT Stop Time 1109    PT Time Calculation (min) 51 min    Activity Tolerance Patient tolerated treatment well    Behavior During Therapy WFL for tasks assessed/performed             Past Medical History:  Diagnosis Date   Constipation    Dysrhythmia    papitations   HTN (hypertension)    Irregular heart beat    Osteoarthritis    lt shoulder,knees, hips,   S/P total knee arthroplasty    Shortness of breath    when she has palpitations    Past Surgical History:  Procedure Laterality Date   APPENDECTOMY     CARPAL TUNNEL RELEASE     CHOLECYSTECTOMY     COLONOSCOPY     EYE SURGERY Right    cataract with lens implant   hip replacement 2011     surgical repair of left hand     TONSILLECTOMY     TOTAL KNEE ARTHROPLASTY Right 04/09/2013   Procedure: TOTAL KNEE ARTHROPLASTY;  Surgeon: Vickey Huger, MD;  Location: [facility];  Service: Orthopedics;  Laterality: Right;   TOTAL KNEE ARTHROPLASTY Left 10/17/2020   Procedure: TOTAL KNEE ARTHROPLASTY;  Surgeon: Dorna Leitz, MD;  Location[facility];  Service: Orthopedics;  Laterality: Left;    There were no vitals filed for this visit.   Subjective Assessment - 11/14/20 1024     Subjective Doing good using her SPC a lot more.    Pertinent History SOB, R TKA 2014, OA, dysrhythmia, HTN, surgical repair of L hand, hip replacement 2011, carpal tunnel release    Diagnostic tests none recent    Patient Stated Goals  decrease pain, increase motion    Currently in Pain? Yes    Pain Score 2     Pain Location Knee                               [facility] Adult PT Treatment/Exercise - 11/14/20 0001       Exercises   Exercises Knee/Hip      Knee/Hip Exercises: Aerobic   Recumbent Bike 6 min (partial revolutions)      Knee/Hip Exercises: Standing   Knee Flexion Strengthening;Left;10 reps    Knee Flexion Limitations 1# HS curl at counter    Hip Flexion Stengthening;Left;10 reps;Knee straight    Hip Flexion Limitations 1# weight at counter    Terminal Knee Extension Strengthening;Left;10 reps;Theraband    Theraband Level (Terminal Knee Extension) Level 4 (Blue)    Terminal Knee Extension Limitations 10x3" holding onto chair    Lateral Step Up Left;10 reps;Hand Hold: 2;Step Height: 4"    Lateral Step Up Limitations counter support    Forward Step Up Left;10 reps;Hand Hold: 2;Step Height: 4"    Forward Step Up Limitations counter support    Functional Squat  10 reps    Functional Squat Limitations depth to tolerance      Knee/Hip Exercises: Seated   Sit to Sand without UE support;10 reps   slight staggered stance with R foot fwd     Knee/Hip Exercises: Supine   Heel Slides AAROM;Left;10 reps    Heel Slides Limitations 10x5" with strap and orange pball      Modalities   Modalities Vasopneumatic      Vasopneumatic   Number Minutes Vasopneumatic  10 minutes    Vasopnuematic Location  Knee    Vasopneumatic Pressure Low    Vasopneumatic Temperature  42                      PT Short Term Goals - 11/11/20 1358       PT SHORT TERM GOAL #1   Title Patient to be independent with initial HEP.    Time 3    Period Weeks    Status Achieved    Target Date 11/20/20               PT Long Term Goals - 11/05/20 1656       PT LONG TERM GOAL #1   Title Patient to be independent with advanced HEP.    Time 8    Period Weeks    Status On-going      PT LONG TERM  GOAL #2   Title Patient to demonstrate  L knee AROM/PROM WFL.    Time 8    Period Weeks    Status On-going      PT LONG TERM GOAL #3   Title Patient to demonstrate >=4+/5 strength in B LEs.    Time 8    Period Weeks    Status On-going      PT LONG TERM GOAL #4   Title Patient to demonstrate reciprocal stair climbing up/down 13 steps with 1 handrail without pain limiting and good quad stability.    Time 8    Period Weeks    Status On-going      PT LONG TERM GOAL #5   Title Patient to demonstrate adequate step length, heel-toe pattern, and knee flexion with ambulation with LRAD.    Time 8    Period Weeks    Status On-going                   Plan - 11/14/20 1106     Clinical Impression Statement Pt overall responded well. She came into clinic with a SPC and reported that she has been using it a lot more. She showed a good demonstration of the exercises today but needed instruction to isolate the targeted muscles with the exercises. She was able to complete a STS with a slight staggered stance with the R LE fwd w/o hand support. Lots of instruction required to isolate the quads during the TKE in standing. Ended session with vaso for reducing pain and edema, pt responded well.    Personal Factors and Comorbidities Age;Comorbidity 3+;Past/Current Experience;Time since onset of injury/illness/exacerbation    Comorbidities SOB, R TKA 2014, OA, dysrhythmia, HTN, surgical repair of L hand, hip replacement 2011, carpal tunnel release    PT Frequency 2x / week    PT Duration 8 weeks    PT Treatment/Interventions ADLs/Self Care Home Management;Cryotherapy;Electrical Stimulation;Iontophoresis 4mg /ml Dexamethasone;Moist Heat;Balance training;Therapeutic exercise;Therapeutic activities;Functional mobility training;Stair training;Gait training;Ultrasound;DME Instruction;Neuromuscular re-education;Patient/family education;Manual techniques;Vasopneumatic Device;Taping;Energy conservation;Dry  needling;Passive range of motion;Scar mobilization    PT Next  Visit Plan progress knee flexion ROM and LE strength    Consulted and Agree with Plan of Care Patient             Patient will benefit from skilled therapeutic intervention in order to improve the following deficits and impairments:  Abnormal gait, Hypomobility, Increased edema, Decreased scar mobility, Decreased activity tolerance, Decreased strength, Increased fascial restricitons, Pain, Increased muscle spasms, Difficulty walking, Decreased balance, Decreased range of motion, Improper body mechanics, Postural dysfunction, Impaired flexibility  Visit Diagnosis: Acute pain of left knee  Stiffness of left knee, not elsewhere classified  Muscle weakness (generalized)  Localized edema  Other abnormalities of gait and mobility     Problem List Patient Active Problem List   Diagnosis Date Noted   Primary osteoarthritis of left knee 10/17/2020   Status post total left knee replacement 10/17/2020   Status post total knee replacement, left 10/17/2020   S/P total knee arthroplasty    HTN (hypertension)    Osteoarthritis    PVC (premature ventricular contraction) 03/13/2013   Other specified cardiac dysrhythmias(427.89) 03/13/2013    Artist Pais, PTA 11/14/2020, 11:55 AM  [facility] [address]  [place], [place], [address] Phone: [phone]   Fax:  [phone]  Name: Carlisle Torgeson MRN: [id number] Date of Birth: [birth year]

## 2020-11-17 ENCOUNTER — Ambulatory Visit: Payer: Medicare HMO

## 2020-11-17 ENCOUNTER — Other Ambulatory Visit: Payer: Self-pay

## 2020-11-17 DIAGNOSIS — M25562 Pain in left knee: Secondary | ICD-10-CM

## 2020-11-17 DIAGNOSIS — R600 Localized edema: Secondary | ICD-10-CM

## 2020-11-17 DIAGNOSIS — R2689 Other abnormalities of gait and mobility: Secondary | ICD-10-CM | POA: Diagnosis not present

## 2020-11-17 DIAGNOSIS — M25662 Stiffness of left knee, not elsewhere classified: Secondary | ICD-10-CM

## 2020-11-17 DIAGNOSIS — M6281 Muscle weakness (generalized): Secondary | ICD-10-CM | POA: Diagnosis not present

## 2020-11-17 NOTE — Therapy (Signed)
[facility] [address]  [place], [place], [address] Phone: [phone]   Fax:  [phone]  Physical Therapy Treatment  Patient Details  Name: Maria Rowland MRN: [id number] Date of Birth: 07/03/1940 Referring Provider (PT): Dorna Leitz, MD   Encounter Date: 11/17/2020   PT End of Session - 11/17/20 1017     Visit Number 6    Number of Visits 17    Date for PT Re-Evaluation 12/25/20    Authorization Type [employer] & [employer]    PT Start Time [phone]    PT Stop Time 1027    PT Time Calculation (min) 53 min    Activity Tolerance Patient tolerated treatment well    Behavior During Therapy WFL for tasks assessed/performed             Past Medical History:  Diagnosis Date   Constipation    Dysrhythmia    papitations   HTN (hypertension)    Irregular heart beat    Osteoarthritis    lt shoulder,knees, hips,   S/P total knee arthroplasty    Shortness of breath    when she has palpitations    Past Surgical History:  Procedure Laterality Date   APPENDECTOMY     CARPAL TUNNEL RELEASE     CHOLECYSTECTOMY     COLONOSCOPY     EYE SURGERY Right    cataract with lens implant   hip replacement 2011     surgical repair of left hand     TONSILLECTOMY     TOTAL KNEE ARTHROPLASTY Right 04/09/2013   Procedure: TOTAL KNEE ARTHROPLASTY;  Surgeon: Vickey Huger, MD;  Location: [facility];  Service: Orthopedics;  Laterality: Right;   TOTAL KNEE ARTHROPLASTY Left 10/17/2020   Procedure: TOTAL KNEE ARTHROPLASTY;  Surgeon: Dorna Leitz, MD;  Location[facility];  Service: Orthopedics;  Laterality: Left;    There were no vitals filed for this visit.   Subjective Assessment - 11/17/20 0938     Subjective Reports no pain when at rest but 3/10 when moving her knee.    Pertinent History SOB, R TKA 2014, OA, dysrhythmia, HTN, surgical repair of L hand, hip replacement 2011, carpal tunnel release    Diagnostic tests none recent     Patient Stated Goals decrease pain, increase motion    Currently in Pain? Yes    Pain Score 3     Pain Location Knee    Pain Orientation Left    Pain Descriptors / Indicators Aching    Pain Type Acute pain;Surgical pain                [facility] PT Assessment - 11/17/20 0001       AROM   Left Knee Extension 3    Left Knee Flexion 95                           [facility] Adult PT Treatment/Exercise - 11/17/20 0001       Exercises   Exercises Knee/Hip      Knee/Hip Exercises: Stretches   Other Knee/Hip Stretches L knee flexion stretch on treadmill step 5x5"      Knee/Hip Exercises: Aerobic   Recumbent Bike 6 min (partial revolutions)      Knee/Hip Exercises: Standing   Forward Step Up Left;10 reps;Hand Hold: 2;Step Height: 6";2 sets    Forward Step Up Limitations B UE support    Functional Squat 2  sets;10 reps    Functional Squat Limitations depth to tolerance      Knee/Hip Exercises: Seated   Sit to Sand without UE support;10 reps   yellow medicine ball at chest; cues for equal foot position     Knee/Hip Exercises: Supine   Straight Leg Raises Strengthening;Left;10 reps   3 sec holds   Straight Leg Raises Limitations no quad lag but decreased TKE      Modalities   Modalities Cryotherapy      Cryotherapy   Number Minutes Cryotherapy 10 Minutes    Cryotherapy Location Knee    Type of Cryotherapy Ice pack      Manual Therapy   Manual Therapy Joint mobilization;Passive ROM    Manual therapy comments supine    Joint Mobilization L patellar jt mobs in all directions grade III/IV    Passive ROM Knee flex and ext gentle into end ranges                      PT Short Term Goals - 11/11/20 1358       PT SHORT TERM GOAL #1   Title Patient to be independent with initial HEP.    Time 3    Period Weeks    Status Achieved    Target Date 11/20/20               PT Long Term Goals - 11/17/20 1018       PT LONG TERM GOAL #1   Title  Patient to be independent with advanced HEP.    Time 8    Period Weeks    Status On-going      PT LONG TERM GOAL #2   Title Patient to demonstrate  L knee AROM/PROM WFL.    Time 8    Period Weeks    Status On-going   3-95 deg AROM     PT LONG TERM GOAL #3   Title Patient to demonstrate >=4+/5 strength in B LEs.    Time 8    Period Weeks    Status On-going      PT LONG TERM GOAL #4   Title Patient to demonstrate reciprocal stair climbing up/down 13 steps with 1 handrail without pain limiting and good quad stability.    Time 8    Period Weeks    Status On-going      PT LONG TERM GOAL #5   Title Patient to demonstrate adequate step length, heel-toe pattern, and knee flexion with ambulation with LRAD.    Time 8    Period Weeks    Status On-going                   Plan - 11/17/20 1024     Clinical Impression Statement Pt had only mild reports of pain beginning today's session. L knee AROM is showing improvements at 3-95 deg. She demonstrates less WS to the L side with an antalgic gait. She requires instruction to keep equal foot positioning during STS and squats. Also instruction to reduce vaulting onto steps with L LE and use of UEs. No complaints with any of the exercises today, she shows to be continuously making progress toward goals and responding well to treatments. Pt requested an ice pack following treatment today for pain and edema control.    Personal Factors and Comorbidities Age;Comorbidity 3+;Past/Current Experience;Time since onset of injury/illness/exacerbation    Comorbidities SOB, R TKA 2014, OA, dysrhythmia, HTN, surgical repair of L  hand, hip replacement 2011, carpal tunnel release    PT Frequency 2x / week    PT Duration 8 weeks    PT Treatment/Interventions ADLs/Self Care Home Management;Cryotherapy;Electrical Stimulation;Iontophoresis 4mg /ml Dexamethasone;Moist Heat;Balance training;Therapeutic exercise;Therapeutic activities;Functional mobility  training;Stair training;Gait training;Ultrasound;DME Instruction;Neuromuscular re-education;Patient/family education;Manual techniques;Vasopneumatic Device;Taping;Energy conservation;Dry needling;Passive range of motion;Scar mobilization    PT Next Visit Plan progress knee flexion ROM and LE strength    Consulted and Agree with Plan of Care Patient             Patient will benefit from skilled therapeutic intervention in order to improve the following deficits and impairments:  Abnormal gait, Hypomobility, Increased edema, Decreased scar mobility, Decreased activity tolerance, Decreased strength, Increased fascial restricitons, Pain, Increased muscle spasms, Difficulty walking, Decreased balance, Decreased range of motion, Improper body mechanics, Postural dysfunction, Impaired flexibility  Visit Diagnosis: Acute pain of left knee  Stiffness of left knee, not elsewhere classified  Muscle weakness (generalized)  Localized edema  Other abnormalities of gait and mobility     Problem List Patient Active Problem List   Diagnosis Date Noted   Primary osteoarthritis of left knee 10/17/2020   Status post total left knee replacement 10/17/2020   Status post total knee replacement, left 10/17/2020   S/P total knee arthroplasty    HTN (hypertension)    Osteoarthritis    PVC (premature ventricular contraction) 03/13/2013   Other specified cardiac dysrhythmias(427.89) 03/13/2013    Artist Pais, PTA 11/17/2020, 11:59 AM  [facility] [address]  [place], [place], [address] Phone: [phone]   Fax:  [phone]  Name: Pearlean Sabina MRN: [id number] Date of Birth: Aug 29, 1940

## 2020-11-19 ENCOUNTER — Other Ambulatory Visit: Payer: Self-pay

## 2020-11-19 ENCOUNTER — Ambulatory Visit: Payer: Medicare HMO

## 2020-11-19 DIAGNOSIS — R2689 Other abnormalities of gait and mobility: Secondary | ICD-10-CM

## 2020-11-19 DIAGNOSIS — R600 Localized edema: Secondary | ICD-10-CM

## 2020-11-19 DIAGNOSIS — M25562 Pain in left knee: Secondary | ICD-10-CM | POA: Diagnosis not present

## 2020-11-19 DIAGNOSIS — M25662 Stiffness of left knee, not elsewhere classified: Secondary | ICD-10-CM | POA: Diagnosis not present

## 2020-11-19 DIAGNOSIS — M6281 Muscle weakness (generalized): Secondary | ICD-10-CM | POA: Diagnosis not present

## 2020-11-19 NOTE — Therapy (Signed)
[facility] [address]  [place], [place], [address] Phone: [phone]   Fax:  [phone]  Physical Therapy Treatment  Patient Details  Name: Maria Rowland MRN: [id number] Date of Birth: February 16, 1941 Referring Provider (PT): Dorna Leitz, MD   Encounter Date: 11/19/2020   PT End of Session - 11/19/20 1014     Visit Number 7    Number of Visits 17    Date for PT Re-Evaluation 12/25/20    Authorization Type [employer]    PT Start Time 0932    PT Stop Time 1023    PT Time Calculation (min) 51 min    Activity Tolerance Patient tolerated treatment well    Behavior During Therapy WFL for tasks assessed/performed             Past Medical History:  Diagnosis Date   Constipation    Dysrhythmia    papitations   HTN (hypertension)    Irregular heart beat    Osteoarthritis    lt shoulder,knees, hips,   S/P total knee arthroplasty    Shortness of breath    when she has palpitations    Past Surgical History:  Procedure Laterality Date   APPENDECTOMY     CARPAL TUNNEL RELEASE     CHOLECYSTECTOMY     COLONOSCOPY     EYE SURGERY Right    cataract with lens implant   hip replacement 2011     surgical repair of left hand     TONSILLECTOMY     TOTAL KNEE ARTHROPLASTY Right 04/09/2013   Procedure: TOTAL KNEE ARTHROPLASTY;  Surgeon: Vickey Huger, MD;  Location: [facility];  Service: Orthopedics;  Laterality: Right;   TOTAL KNEE ARTHROPLASTY Left 10/17/2020   Procedure: TOTAL KNEE ARTHROPLASTY;  Surgeon: Dorna Leitz, MD;  Location: [facility];  Service: Orthopedics;  Laterality: Left;    There were no vitals filed for this visit.   Subjective Assessment - 11/19/20 0934     Subjective Notes nocturnal pain which is alleviated with pain meds. Only having pain when bending her knee.    Pertinent History SOB, R TKA 2014, OA, dysrhythmia, HTN, surgical repair of L hand, hip replacement 2011, carpal tunnel release     Diagnostic tests none recent    Patient Stated Goals decrease pain, increase motion    Currently in Pain? Yes    Pain Score 3     Pain Location Knee    Pain Orientation Left    Pain Descriptors / Indicators Aching    Pain Type Acute pain;Surgical pain                [facility] PT Assessment - 11/19/20 0001       Assessment   Medical Diagnosis s/p L TKA    Referring Provider (PT) Dorna Leitz, MD    Onset Date/Surgical Date 10/17/20      AROM   Left Knee Extension 3    Left Knee Flexion 99                           [facility] Adult PT Treatment/Exercise - 11/19/20 0001       Knee/Hip Exercises: Aerobic   Recumbent Bike 6 min (partial revolutions)      Knee/Hip Exercises: Standing   Step Down Right;10 reps;Hand Hold: 1;Step Height: 4"    Step Down Limitations fwd and lateral focusing on eccentric control of quads; 10  reps each      Knee/Hip Exercises: Seated   Long Arc Quad Strengthening;Both;10 reps;Weights    Long Arc Quad Weight 2 lbs.    Long [employer] Limitations focusing on TKE    Heel Slides AROM;AAROM;Left;2 sets;10 reps    Heel Slides Limitations foot on peanut ball with occasional OP      Knee/Hip Exercises: Supine   Heel Slides --    Heel Slides Limitations --      Modalities   Modalities Vasopneumatic      Vasopneumatic   Number Minutes Vasopneumatic  10 minutes    Vasopnuematic Location  Knee    Vasopneumatic Pressure Low    Vasopneumatic Temperature  40                      PT Short Term Goals - 11/11/20 1358       PT SHORT TERM GOAL #1   Title Patient to be independent with initial HEP.    Time 3    Period Weeks    Status Achieved    Target Date 11/20/20               PT Long Term Goals - 11/19/20 1014       PT LONG TERM GOAL #1   Title Patient to be independent with advanced HEP.    Time 8    Period Weeks    Status On-going      PT LONG TERM GOAL #2   Title Patient to demonstrate  L knee  AROM/PROM WFL.    Time 8    Period Weeks    Status On-going   3-99 deg AROM     PT LONG TERM GOAL #3   Title Patient to demonstrate >=4+/5 strength in B LEs.    Time 8    Period Weeks    Status On-going      PT LONG TERM GOAL #4   Title Patient to demonstrate reciprocal stair climbing up/down 13 steps with 1 handrail without pain limiting and good quad stability.    Time 8    Period Weeks    Status On-going      PT LONG TERM GOAL #5   Title Patient to demonstrate adequate step length, heel-toe pattern, and knee flexion with ambulation with LRAD.    Time 8    Period Weeks    Status On-going                   Plan - 11/19/20 1015     Clinical Impression Statement Pt shows improvements with knee AROM (3-99 deg). She shows a good steady gait with SPC with minimal deviation. Didn't measure strength today but she demonstrated quad weakness with decreased TKE. She shows difficulty with eccentric control of the quads in positions such as descending stairs. She notes nocturnal pain in her L knee and difficulty getting comfortable in order to sleep but for the most part is resolved with pain meds. Pain levels remain low throughout sessions and she shows good tolerance with exercises. Ended session with vaso for edema, pt responded well.    Personal Factors and Comorbidities Age;Comorbidity 3+;Past/Current Experience;Time since onset of injury/illness/exacerbation    Comorbidities SOB, R TKA 2014, OA, dysrhythmia, HTN, surgical repair of L hand, hip replacement 2011, carpal tunnel release    PT Frequency 2x / week    PT Duration 8 weeks    PT Treatment/Interventions ADLs/Self Care Home Management;Cryotherapy;Electrical Stimulation;Iontophoresis 4mg /ml  Dexamethasone;Moist Heat;Balance training;Therapeutic exercise;Therapeutic activities;Functional mobility training;Stair training;Gait training;Ultrasound;DME Instruction;Neuromuscular re-education;Patient/family education;Manual  techniques;Vasopneumatic Device;Taping;Energy conservation;Dry needling;Passive range of motion;Scar mobilization    PT Next Visit Plan progress knee flexion ROM; quad strength and eccentrics    Consulted and Agree with Plan of Care Patient             Patient will benefit from skilled therapeutic intervention in order to improve the following deficits and impairments:  Abnormal gait, Hypomobility, Increased edema, Decreased scar mobility, Decreased activity tolerance, Decreased strength, Increased fascial restricitons, Pain, Increased muscle spasms, Difficulty walking, Decreased balance, Decreased range of motion, Improper body mechanics, Postural dysfunction, Impaired flexibility  Visit Diagnosis: Acute pain of left knee  Stiffness of left knee, not elsewhere classified  Muscle weakness (generalized)  Localized edema  Other abnormalities of gait and mobility     Problem List Patient Active Problem List   Diagnosis Date Noted   Primary osteoarthritis of left knee 10/17/2020   Status post total left knee replacement 10/17/2020   Status post total knee replacement, left 10/17/2020   S/P total knee arthroplasty    HTN (hypertension)    Osteoarthritis    PVC (premature ventricular contraction) 03/13/2013   Other specified cardiac dysrhythmias(427.89) 03/13/2013    Artist Pais, PTA 11/19/2020, 11:02 AM  [facility] [address]  [place], [place], [address] Phone: [phone]   Fax:  [phone]  Name: Maria Rowland MRN: [id number] Date of Birth: [birth year]

## 2020-11-26 ENCOUNTER — Other Ambulatory Visit: Payer: Self-pay

## 2020-11-26 ENCOUNTER — Ambulatory Visit: Payer: Medicare HMO | Attending: Orthopedic Surgery

## 2020-11-26 DIAGNOSIS — R600 Localized edema: Secondary | ICD-10-CM | POA: Insufficient documentation

## 2020-11-26 DIAGNOSIS — M25662 Stiffness of left knee, not elsewhere classified: Secondary | ICD-10-CM | POA: Insufficient documentation

## 2020-11-26 DIAGNOSIS — M25562 Pain in left knee: Secondary | ICD-10-CM | POA: Diagnosis not present

## 2020-11-26 DIAGNOSIS — M6281 Muscle weakness (generalized): Secondary | ICD-10-CM | POA: Insufficient documentation

## 2020-11-26 DIAGNOSIS — R2689 Other abnormalities of gait and mobility: Secondary | ICD-10-CM | POA: Diagnosis not present

## 2020-11-26 NOTE — Therapy (Signed)
[facility] [address]  [place], [place], [address] Phone: [phone]   Fax:  [phone]  Physical Therapy Treatment  Patient Details  Name: Maria Rowland MRN: [id number] Date of Birth: [birth year] Referring Provider (PT): Dorna Leitz, MD   Encounter Date: 11/26/2020   PT End of Session - 11/26/20 1804     Visit Number 8    Number of Visits 17    Date for PT Re-Evaluation 12/25/20    Authorization Type [employer] & [employer]    PT Start Time 1533    PT Stop Time 1623    PT Time Calculation (min) 50 min    Activity Tolerance Patient tolerated treatment well    Behavior During Therapy WFL for tasks assessed/performed             Past Medical History:  Diagnosis Date   Constipation    Dysrhythmia    papitations   HTN (hypertension)    Irregular heart beat    Osteoarthritis    lt shoulder,knees, hips,   S/P total knee arthroplasty    Shortness of breath    when she has palpitations    Past Surgical History:  Procedure Laterality Date   APPENDECTOMY     CARPAL TUNNEL RELEASE     CHOLECYSTECTOMY     COLONOSCOPY     EYE SURGERY Right    cataract with lens implant   hip replacement 2011     surgical repair of left hand     TONSILLECTOMY     TOTAL KNEE ARTHROPLASTY Right 04/09/2013   Procedure: TOTAL KNEE ARTHROPLASTY;  Surgeon: Vickey Huger, MD;  Location: [facility];  Service: Orthopedics;  Laterality: Right;   TOTAL KNEE ARTHROPLASTY Left 10/17/2020   Procedure: TOTAL KNEE ARTHROPLASTY;  Surgeon: Dorna Leitz, MD;  Location: [facility];  Service: Orthopedics;  Laterality: Left;    There were no vitals filed for this visit.   Subjective Assessment - 11/26/20 1538     Subjective Pt doing well with no new complaints.    Pertinent History SOB, R TKA 2014, OA, dysrhythmia, HTN, surgical repair of L hand, hip replacement 2011, carpal tunnel release    Diagnostic tests none recent    Patient Stated Goals  decrease pain, increase motion    Currently in Pain? Yes    Pain Score 4     Pain Location Knee    Pain Orientation Left    Pain Descriptors / Indicators Aching    Pain Type Acute pain;Surgical pain                [facility] PT Assessment - 11/26/20 0001       Strength   Right Hip ABduction 4+/5    Right Hip ADduction 4+/5    Left Hip Flexion 4/5    Left Hip ABduction 4/5    Left Hip ADduction 4+/5    Left Knee Flexion 4-/5    Left Knee Extension 4/5                           [facility] Adult PT Treatment/Exercise - 11/26/20 0001       Ambulation/Gait   Stairs Yes    Stairs Assistance 6: Modified independent (Device/Increase time)    Stair Management Technique One rail Right;One rail Left;Alternating pattern    Number of Stairs 13    Height of Stairs 8      Knee/Hip Exercises:  Aerobic   Recumbent Bike 6 min full rev      Knee/Hip Exercises: Standing   Forward Step Up Left;10 reps;Hand Hold: 1;Step Height: 6"    Forward Step Up Limitations 10 additional reps with blue TB TKE      Modalities   Modalities Vasopneumatic      Vasopneumatic   Number Minutes Vasopneumatic  10 minutes    Vasopnuematic Location  Knee    Vasopneumatic Pressure Low    Vasopneumatic Temperature  40                    PT Education - 11/26/20 1809     Education Details Educated on progress toward goals and safely practicing stairs.    Person(s) Educated Patient    Methods Explanation;Demonstration    Comprehension Verbalized understanding;Returned demonstration              PT Short Term Goals - 11/11/20 1358       PT SHORT TERM GOAL #1   Title Patient to be independent with initial HEP.    Time 3    Period Weeks    Status Achieved    Target Date 11/20/20               PT Long Term Goals - 11/26/20 1545       PT LONG TERM GOAL #1   Title Patient to be independent with advanced HEP.    Time 8    Period Weeks    Status On-going      PT  LONG TERM GOAL #2   Title Patient to demonstrate  L knee AROM/PROM WFL.    Time 8    Period Weeks    Status On-going   3-99 deg AROM     PT LONG TERM GOAL #3   Title Patient to demonstrate >=4+/5 strength in B LEs.    Time 8    Period Weeks    Status On-going      PT LONG TERM GOAL #4   Title Patient to demonstrate reciprocal stair climbing up/down 13 steps with 1 handrail without pain limiting and good quad stability.    Time 8    Period Weeks    Status On-going   heavy UE use with going up and downstairs. Lack of stability going downstairs     PT LONG TERM GOAL #5   Title Patient to demonstrate adequate step length, heel-toe pattern, and knee flexion with ambulation with LRAD.    Time 8    Period Weeks    Status On-going                   Plan - 11/26/20 1812     Clinical Impression Statement Pt shows slight improvement in L LE strength but still weakness in knee and hip muscles. She was able to do a reciprocal pattern with steps but demonstrated much UE use. We talked about trying to do stairs at home using up with the good and down with the bad method, until further practice with stairs and L quads get stronger. Did step up exercises again today and added blue TB to increase TKE with step up. Ended session with vaso to control swelling and soreness.    Personal Factors and Comorbidities Age;Comorbidity 3+;Past/Current Experience;Time since onset of injury/illness/exacerbation    Comorbidities SOB, R TKA 2014, OA, dysrhythmia, HTN, surgical repair of L hand, hip replacement 2011, carpal tunnel release    PT Frequency 2x /  week    PT Duration 8 weeks    PT Treatment/Interventions ADLs/Self Care Home Management;Cryotherapy;Electrical Stimulation;Iontophoresis 4mg /ml Dexamethasone;Moist Heat;Balance training;Therapeutic exercise;Therapeutic activities;Functional mobility training;Stair training;Gait training;Ultrasound;DME Instruction;Neuromuscular  re-education;Patient/family education;Manual techniques;Vasopneumatic Device;Taping;Energy conservation;Dry needling;Passive range of motion;Scar mobilization    PT Next Visit Plan progress knee flexion ROM; quad strength and eccentrics    Consulted and Agree with Plan of Care Patient             Patient will benefit from skilled therapeutic intervention in order to improve the following deficits and impairments:  Abnormal gait, Hypomobility, Increased edema, Decreased scar mobility, Decreased activity tolerance, Decreased strength, Increased fascial restricitons, Pain, Increased muscle spasms, Difficulty walking, Decreased balance, Decreased range of motion, Improper body mechanics, Postural dysfunction, Impaired flexibility  Visit Diagnosis: Acute pain of left knee  Stiffness of left knee, not elsewhere classified  Muscle weakness (generalized)  Localized edema  Other abnormalities of gait and mobility     Problem List Patient Active Problem List   Diagnosis Date Noted   Primary osteoarthritis of left knee 10/17/2020   Status post total left knee replacement 10/17/2020   Status post total knee replacement, left 10/17/2020   S/P total knee arthroplasty    HTN (hypertension)    Osteoarthritis    PVC (premature ventricular contraction) 03/13/2013   Other specified cardiac dysrhythmias(427.89) 03/13/2013    Artist Pais, PTA 11/26/2020, 6:20 PM  [facility] [address]  [place], [place], [address] Phone: [phone]   Fax:  [phone]  Name: Deeann Servidio MRN: [id number] Date of Birth: [birth year]

## 2020-11-27 ENCOUNTER — Ambulatory Visit: Payer: Medicare HMO

## 2020-11-27 DIAGNOSIS — M25562 Pain in left knee: Secondary | ICD-10-CM | POA: Diagnosis not present

## 2020-11-27 DIAGNOSIS — M25662 Stiffness of left knee, not elsewhere classified: Secondary | ICD-10-CM

## 2020-11-27 DIAGNOSIS — R2689 Other abnormalities of gait and mobility: Secondary | ICD-10-CM | POA: Diagnosis not present

## 2020-11-27 DIAGNOSIS — M6281 Muscle weakness (generalized): Secondary | ICD-10-CM | POA: Diagnosis not present

## 2020-11-27 DIAGNOSIS — R600 Localized edema: Secondary | ICD-10-CM

## 2020-11-27 NOTE — Therapy (Signed)
[facility] [address]  [place], [place], [address] Phone: [phone]   Fax:  [phone]  Physical Therapy Treatment  Patient Details  Name: Maria Rowland MRN: [id number] Date of Birth: [birth year] Referring Provider (PT): Dorna Leitz, MD   Encounter Date: 11/27/2020   PT End of Session - 11/27/20 1533     Visit Number 9    Number of Visits 17    Date for PT Re-Evaluation 12/25/20    Authorization Type [employer] & [employer]    PT Start Time 5170    PT Stop Time 1535    PT Time Calculation (min) 56 min    Activity Tolerance Patient tolerated treatment well    Behavior During Therapy WFL for tasks assessed/performed             Past Medical History:  Diagnosis Date   Constipation    Dysrhythmia    papitations   HTN (hypertension)    Irregular heart beat    Osteoarthritis    lt shoulder,knees, hips,   S/P total knee arthroplasty    Shortness of breath    when she has palpitations    Past Surgical History:  Procedure Laterality Date   APPENDECTOMY     CARPAL TUNNEL RELEASE     CHOLECYSTECTOMY     COLONOSCOPY     EYE SURGERY Right    cataract with lens implant   hip replacement 2011     surgical repair of left hand     TONSILLECTOMY     TOTAL KNEE ARTHROPLASTY Right 04/09/2013   Procedure: TOTAL KNEE ARTHROPLASTY;  Surgeon: Vickey Huger, MD;  Location: [facility];  Service: Orthopedics;  Laterality: Right;   TOTAL KNEE ARTHROPLASTY Left 10/17/2020   Procedure: TOTAL KNEE ARTHROPLASTY;  Surgeon: Dorna Leitz, MD;  Location[facility];  Service: Orthopedics;  Laterality: Left;    There were no vitals filed for this visit.   Subjective Assessment - 11/27/20 1443     Subjective Pt notes lateral L knee pain, no buckling episodes. Still walking w/o SPC short distances.    Pertinent History SOB, R TKA 2014, OA, dysrhythmia, HTN, surgical repair of L hand, hip replacement 2011, carpal tunnel release     Diagnostic tests none recent    Patient Stated Goals decrease pain, increase motion    Currently in Pain? Yes    Pain Score 4     Pain Location Knee    Pain Orientation Left;Lateral    Pain Descriptors / Indicators Aching    Pain Type Acute pain;Surgical pain                               [facility] Adult PT Treatment/Exercise - 11/27/20 0001       Knee/Hip Exercises: Stretches   Hip Flexor Stretch Left;2 reps;30 seconds    Hip Flexor Stretch Limitations mod thomas with strap      Knee/Hip Exercises: Aerobic   Recumbent Bike 2 min full rev; L1x79min      Knee/Hip Exercises: Machines for Strengthening   Cybex Knee Extension 10# B LE 10 reps    Cybex Knee Flexion 15# B LE 10 reps      Knee/Hip Exercises: Standing   Knee Flexion Strengthening;Both;10 reps    Knee Flexion Limitations 2# weight at counter    Hip Abduction Stengthening;Both;10 reps    Abduction Limitations 2# weight at counter; cues to  avoid pelvis rotation      Knee/Hip Exercises: Seated   Heel Slides AROM;AAROM;Left;10 reps    Heel Slides Limitations foot on peanut ball with occasional OP      Modalities   Modalities Vasopneumatic      Vasopneumatic   Number Minutes Vasopneumatic  10 minutes    Vasopnuematic Location  Knee    Vasopneumatic Pressure Low    Vasopneumatic Temperature  40      Manual Therapy   Manual Therapy Soft tissue mobilization    Soft tissue mobilization IASTM to L quads                    PT Education - 11/27/20 1832     Education Details HEP update: Access Code: [email or web address]    Person(s) Educated Patient    Methods Explanation;Demonstration;Handout    Comprehension Verbalized understanding;Returned demonstration;Verbal cues required              PT Short Term Goals - 11/11/20 1358       PT SHORT TERM GOAL #1   Title Patient to be independent with initial HEP.    Time 3    Period Weeks    Status Achieved    Target Date 11/20/20                PT Long Term Goals - 11/26/20 1545       PT LONG TERM GOAL #1   Title Patient to be independent with advanced HEP.    Time 8    Period Weeks    Status On-going      PT LONG TERM GOAL #2   Title Patient to demonstrate  L knee AROM/PROM WFL.    Time 8    Period Weeks    Status On-going   3-99 deg AROM     PT LONG TERM GOAL #3   Title Patient to demonstrate >=4+/5 strength in B LEs.    Time 8    Period Weeks    Status On-going      PT LONG TERM GOAL #4   Title Patient to demonstrate reciprocal stair climbing up/down 13 steps with 1 handrail without pain limiting and good quad stability.    Time 8    Period Weeks    Status On-going   heavy UE use with going up and downstairs. Lack of stability going downstairs     PT LONG TERM GOAL #5   Title Patient to demonstrate adequate step length, heel-toe pattern, and knee flexion with ambulation with LRAD.    Time 8    Period Weeks    Status On-going                   Plan - 11/27/20 1534     Clinical Impression Statement Pt notes that she is doing well with her home exercises. Based on last visits strength testing we added exercises to focus on going forward. Cues required with hip abduction to prevent rotation at the hip. Tried IASTM to the L quads after she reported that her anterior thigh was sore and tight. She did ok with this but had ttp and decent tolerance. Afterward she did demonstrate better AROM, even though measurements were not taken. Added HEP exercises today to increase challenges and carryover at home. Ended session with vaso for pain and edema. She responded well.    Personal Factors and Comorbidities Age;Comorbidity 3+;Past/Current Experience;Time since onset of injury/illness/exacerbation    Comorbidities SOB, R  TKA 2014, OA, dysrhythmia, HTN, surgical repair of L hand, hip replacement 2011, carpal tunnel release    PT Frequency 2x / week    PT Duration 8 weeks    PT Treatment/Interventions ADLs/Self  Care Home Management;Cryotherapy;Electrical Stimulation;Iontophoresis 4mg /ml Dexamethasone;Moist Heat;Balance training;Therapeutic exercise;Therapeutic activities;Functional mobility training;Stair training;Gait training;Ultrasound;DME Instruction;Neuromuscular re-education;Patient/family education;Manual techniques;Vasopneumatic Device;Taping;Energy conservation;Dry needling;Passive range of motion;Scar mobilization    PT Next Visit Plan progress knee flexion ROM; quad strength and eccentrics    Consulted and Agree with Plan of Care Patient             Patient will benefit from skilled therapeutic intervention in order to improve the following deficits and impairments:  Abnormal gait, Hypomobility, Increased edema, Decreased scar mobility, Decreased activity tolerance, Decreased strength, Increased fascial restricitons, Pain, Increased muscle spasms, Difficulty walking, Decreased balance, Decreased range of motion, Improper body mechanics, Postural dysfunction, Impaired flexibility  Visit Diagnosis: Acute pain of left knee  Stiffness of left knee, not elsewhere classified  Muscle weakness (generalized)  Localized edema  Other abnormalities of gait and mobility     Problem List Patient Active Problem List   Diagnosis Date Noted   Primary osteoarthritis of left knee 10/17/2020   Status post total left knee replacement 10/17/2020   Status post total knee replacement, left 10/17/2020   S/P total knee arthroplasty    HTN (hypertension)    Osteoarthritis    PVC (premature ventricular contraction) 03/13/2013   Other specified cardiac dysrhythmias(427.89) 03/13/2013    Artist Pais, PTA 11/27/2020, 6:43 PM  [facility] [address]  [place], [place], [address] Phone: [phone]   Fax:  [phone]  Name: Maria Rowland MRN: [id number] Date of Birth: 1940/06/27

## 2020-12-02 ENCOUNTER — Ambulatory Visit: Payer: Medicare HMO | Admitting: Rehabilitative and Restorative Service Providers"

## 2020-12-02 ENCOUNTER — Other Ambulatory Visit: Payer: Self-pay

## 2020-12-02 ENCOUNTER — Encounter: Payer: Self-pay | Admitting: Rehabilitative and Restorative Service Providers"

## 2020-12-02 DIAGNOSIS — M25662 Stiffness of left knee, not elsewhere classified: Secondary | ICD-10-CM | POA: Diagnosis not present

## 2020-12-02 DIAGNOSIS — R2689 Other abnormalities of gait and mobility: Secondary | ICD-10-CM | POA: Diagnosis not present

## 2020-12-02 DIAGNOSIS — R600 Localized edema: Secondary | ICD-10-CM | POA: Diagnosis not present

## 2020-12-02 DIAGNOSIS — M25562 Pain in left knee: Secondary | ICD-10-CM

## 2020-12-02 DIAGNOSIS — M6281 Muscle weakness (generalized): Secondary | ICD-10-CM

## 2020-12-02 NOTE — Therapy (Signed)
[facility] [address]  [place], [place], [address] Phone: [phone]   Fax:  [phone]  Physical Therapy Treatment  Patient Details  Name: Agam Tuohy MRN: [id number] Date of Birth: 07-15-1940 Referring Provider (PT): Dorna Leitz, MD  Progress Note Reporting Period 10/30/2020 to 12/02/2020  See note below for Objective Data and Assessment of Progress/Goals.      Encounter Date: 12/02/2020   PT End of Session - 12/02/20 1107     Visit Number 10    Number of Visits 17    Date for PT Re-Evaluation 12/25/20    Authorization Type [employer] & [employer]    PT Start Time 1102    PT Stop Time 1145    PT Time Calculation (min) 43 min    Activity Tolerance Patient tolerated treatment well    Behavior During Therapy WFL for tasks assessed/performed             Past Medical History:  Diagnosis Date   Constipation    Dysrhythmia    papitations   HTN (hypertension)    Irregular heart beat    Osteoarthritis    lt shoulder,knees, hips,   S/P total knee arthroplasty    Shortness of breath    when she has palpitations    Past Surgical History:  Procedure Laterality Date   APPENDECTOMY     CARPAL TUNNEL RELEASE     CHOLECYSTECTOMY     COLONOSCOPY     EYE SURGERY Right    cataract with lens implant   hip replacement 2011     surgical repair of left hand     TONSILLECTOMY     TOTAL KNEE ARTHROPLASTY Right 04/09/2013   Procedure: TOTAL KNEE ARTHROPLASTY;  Surgeon: Vickey Huger, MD;  Location: [facility];  Service: Orthopedics;  Laterality: Right;   TOTAL KNEE ARTHROPLASTY Left 10/17/2020   Procedure: TOTAL KNEE ARTHROPLASTY;  Surgeon: Dorna Leitz, MD;  Location: [facility];  Service: Orthopedics;  Laterality: Left;    There were no vitals filed for this visit.   Subjective Assessment - 12/02/20 1106     Subjective I had a little pain last night and still a little today on the side.  The pain goes and comes.     Patient Stated Goals decrease pain, increase motion    Currently in Pain? Yes    Pain Score 4     Pain Location Knee    Pain Orientation Left;Lateral    Pain Descriptors / Indicators Aching    Pain Type Acute pain;Surgical pain                [facility] PT Assessment - 12/02/20 0001       Assessment   Medical Diagnosis s/p L TKA    Referring Provider (PT) Dorna Leitz, MD    Onset Date/Surgical Date 10/17/20      Prior Function   Level of Independence Independent    Vocation Retired    Leisure gardening      Observation/Other Assessments   Focus on Therapeutic Outcomes (FOTO)  47%      AROM   Left Knee Extension 3    Left Knee Flexion 104                           [facility] Adult PT Treatment/Exercise - 12/02/20 0001       Ambulation/Gait   Ambulation/Gait Yes    Ambulation/Gait  Assistance 6: Modified independent (Device/Increase time)    Ambulation Distance (Feet) 90 Feet    Assistive device None      High Level Balance   High Level Balance Comments Standing on AirEx with eyes closed and close SBA/CGA      Knee/Hip Exercises: Aerobic   Recumbent Bike L1 x6 min      Knee/Hip Exercises: Machines for Strengthening   Cybex Knee Extension 10# B LE 2x10 reps    Cybex Knee Flexion 15# B LE 2x10 reps      Knee/Hip Exercises: Standing   Knee Flexion Strengthening;Both;2 sets;10 reps    Knee Flexion Limitations 2# weight at counter    Hip Flexion Stengthening;Both;2 sets;10 reps    Hip Flexion Limitations 2#    Hip Abduction Stengthening;Both;2 sets;10 reps    Abduction Limitations 2# weight at counter; cues to avoid pelvis rotation    Forward Step Up Both;1 set;10 reps;Hand Hold: 2;Step Height: 4"      Knee/Hip Exercises: Seated   Heel Slides AROM;AAROM;Left;10 reps    Heel Slides Limitations foot on peanut ball with occasional OP                      PT Short Term Goals - 11/11/20 1358       PT SHORT TERM GOAL #1   Title  Patient to be independent with initial HEP.    Time 3    Period Weeks    Status Achieved    Target Date 11/20/20               PT Long Term Goals - 12/02/20 1156       PT LONG TERM GOAL #1   Title Patient to be independent with advanced HEP.    Status On-going      PT LONG TERM GOAL #2   Title Patient to demonstrate  L knee AROM/PROM WFL.    Status On-going      PT LONG TERM GOAL #3   Title Patient to demonstrate >=4+/5 strength in B LEs.    Status On-going      PT LONG TERM GOAL #4   Title Patient to demonstrate reciprocal stair climbing up/down 13 steps with 1 handrail without pain limiting and good quad stability.    Status On-going      PT LONG TERM GOAL #5   Title Patient to demonstrate adequate step length, heel-toe pattern, and knee flexion with ambulation with LRAD.    Status On-going                   Plan - 12/02/20 1150     Clinical Impression Statement Patient is progressing towards goal related activities.  She is improving with increased AROM and increasing in her ability to progress with exercises in PT gym.  She has started some ambulation without use of assistive device with close SBA with patient carrying SPC in case it is needed.  She continues to perform HEP and is progressing with increased ambulation distance and decreased reliance on assistive device.  She continues to require skilled PT to progress towards goal related activities.    PT Frequency 2x / week    PT Duration 8 weeks    PT Treatment/Interventions ADLs/Self Care Home Management;Cryotherapy;Electrical Stimulation;Iontophoresis 4mg /ml Dexamethasone;Moist Heat;Balance training;Therapeutic exercise;Therapeutic activities;Functional mobility training;Stair training;Gait training;Ultrasound;DME Instruction;Neuromuscular re-education;Patient/family education;Manual techniques;Vasopneumatic Device;Taping;Energy conservation;Dry needling;Passive range of motion;Scar mobilization    PT  Next Visit Plan progress knee flexion ROM; quad  strength and eccentrics    Consulted and Agree with Plan of Care Patient             Patient will benefit from skilled therapeutic intervention in order to improve the following deficits and impairments:  Abnormal gait, Hypomobility, Increased edema, Decreased scar mobility, Decreased activity tolerance, Decreased strength, Increased fascial restricitons, Pain, Increased muscle spasms, Difficulty walking, Decreased balance, Decreased range of motion, Improper body mechanics, Postural dysfunction, Impaired flexibility  Visit Diagnosis: Acute pain of left knee  Stiffness of left knee, not elsewhere classified  Muscle weakness (generalized)  Localized edema  Other abnormalities of gait and mobility     Problem List Patient Active Problem List   Diagnosis Date Noted   Primary osteoarthritis of left knee 10/17/2020   Status post total left knee replacement 10/17/2020   Status post total knee replacement, left 10/17/2020   S/P total knee arthroplasty    HTN (hypertension)    Osteoarthritis    PVC (premature ventricular contraction) 03/13/2013   Other specified cardiac dysrhythmias(427.89) 03/13/2013    Juel Burrow, PT, DPT 12/02/2020, 11:58 AM  [facility] [address]  [place], [place], [address] Phone: [phone]   Fax:  [phone]  Name: Belia Febo MRN: [id number] Date of Birth: [birth year]

## 2020-12-16 ENCOUNTER — Ambulatory Visit: Payer: Medicare HMO

## 2020-12-16 ENCOUNTER — Other Ambulatory Visit: Payer: Self-pay

## 2020-12-16 DIAGNOSIS — M25662 Stiffness of left knee, not elsewhere classified: Secondary | ICD-10-CM

## 2020-12-16 DIAGNOSIS — M25562 Pain in left knee: Secondary | ICD-10-CM | POA: Diagnosis not present

## 2020-12-16 DIAGNOSIS — M6281 Muscle weakness (generalized): Secondary | ICD-10-CM | POA: Diagnosis not present

## 2020-12-16 DIAGNOSIS — R2689 Other abnormalities of gait and mobility: Secondary | ICD-10-CM

## 2020-12-16 DIAGNOSIS — R600 Localized edema: Secondary | ICD-10-CM

## 2020-12-16 NOTE — Therapy (Signed)
[facility] [address]  [place], [place], [address] Phone: [phone]   Fax:  [phone]  Physical Therapy Treatment  Patient Details  Name: Maria Rowland MRN: [id number] Date of Birth: 1941-02-22 Referring Provider (PT): Dorna Leitz, MD   Encounter Date: 12/16/2020   PT End of Session - 12/16/20 1152     Visit Number 11    Number of Visits 17    Date for PT Re-Evaluation 12/25/20    Authorization Type [employer] & [employer]    PT Start Time 1102    PT Stop Time 1157    PT Time Calculation (min) 55 min    Activity Tolerance Patient tolerated treatment well    Behavior During Therapy WFL for tasks assessed/performed             Past Medical History:  Diagnosis Date   Constipation    Dysrhythmia    papitations   HTN (hypertension)    Irregular heart beat    Osteoarthritis    lt shoulder,knees, hips,   S/P total knee arthroplasty    Shortness of breath    when she has palpitations    Past Surgical History:  Procedure Laterality Date   APPENDECTOMY     CARPAL TUNNEL RELEASE     CHOLECYSTECTOMY     COLONOSCOPY     EYE SURGERY Right    cataract with lens implant   hip replacement 2011     surgical repair of left hand     TONSILLECTOMY     TOTAL KNEE ARTHROPLASTY Right 04/09/2013   Procedure: TOTAL KNEE ARTHROPLASTY;  Surgeon: Vickey Huger, MD;  Location: [facility];  Service: Orthopedics;  Laterality: Right;   TOTAL KNEE ARTHROPLASTY Left 10/17/2020   Procedure: TOTAL KNEE ARTHROPLASTY;  Surgeon: Dorna Leitz, MD;  Location: [facility];  Service: Orthopedics;  Laterality: Left;    There were no vitals filed for this visit.   Subjective Assessment - 12/16/20 1105     Subjective Last session had me very sore for the next few days. Went on vacation last week, may be a little behind.    Pertinent History SOB, R TKA 2014, OA, dysrhythmia, HTN, surgical repair of L hand, hip replacement 2011, carpal tunnel  release    Diagnostic tests none recent    Patient Stated Goals decrease pain, increase motion    Currently in Pain? No/denies                [facility] PT Assessment - 12/16/20 0001       AROM   Left Knee Extension 2    Left Knee Flexion 117                           [facility] Adult PT Treatment/Exercise - 12/16/20 0001       Ambulation/Gait   Ambulation/Gait Yes    Ambulation/Gait Assistance 6: Modified independent (Device/Increase time)    Ambulation Distance (Feet) 200 Feet    Assistive device None    Gait Pattern Step-through pattern;Decreased step length - right;Decreased stance time - left;Antalgic    Stairs Yes    Stairs Assistance 6: Modified independent (Device/Increase time)    Number of Stairs 26    Height of Stairs 8      Exercises   Exercises Knee/Hip      Knee/Hip Exercises: Aerobic   Recumbent Bike L3x16mn      Knee/Hip Exercises: Standing  Hip Flexion Stengthening;Left;10 reps;Knee straight    Hip Flexion Limitations 3#    Other Standing Knee Exercises practicing hip and knee flexion during swing, 3# 10 reps      Knee/Hip Exercises: Seated   Heel Slides AROM;Left;10 reps    Heel Slides Limitations foot on peanut ball    Hamstring Curl Strengthening;Left;10 reps    Hamstring Limitations green TB    Sit to Sand 10 reps;without UE support   with blue weighted ball     Modalities   Modalities Vasopneumatic      Vasopneumatic   Number Minutes Vasopneumatic  10 minutes    Vasopnuematic Location  Knee    Vasopneumatic Pressure Medium    Vasopneumatic Temperature  34                      PT Short Term Goals - 11/11/20 1358       PT SHORT TERM GOAL #1   Title Patient to be independent with initial HEP.    Time 3    Period Weeks    Status Achieved    Target Date 11/20/20               PT Long Term Goals - 12/16/20 1130       PT LONG TERM GOAL #1   Title Patient to be independent with advanced HEP.     Status On-going      PT LONG TERM GOAL #2   Title Patient to demonstrate  L knee AROM/PROM WFL.    Status Partially Met   2-117 deg AROM     PT LONG TERM GOAL #3   Title Patient to demonstrate >=4+/5 strength in B LEs.    Status On-going      PT LONG TERM GOAL #4   Title Patient to demonstrate reciprocal stair climbing up/down 13 steps with 1 handrail without pain limiting and good quad stability.    Status On-going      PT LONG TERM GOAL #5   Title Patient to demonstrate adequate step length, heel-toe pattern, and knee flexion with ambulation with LRAD.    Status Partially Met   min decreased step length with R LE and decreased heel to toe pattern                  Plan - 12/16/20 1152     Clinical Impression Statement Pt reportedly was sore after her last session and also was on vacation last week. Pt reports that she saw her doctor recently and they are pleased with her progress. Practiced stairs today because she reports that she doesn't feel as confident navigating stairs. During gait training she demonstrated mild deviations but no instability noted even w/o AD. R knee AROM is at 2-117 deg. She required cues with the pre gait exercise for proper hip/knee motion and WS. Pt feels that going foward she would like to transition to her HEP at the end of her POC but will assess goals to further determine this. Based on today's assessments she has partially met LTGs 2 and 5.    Personal Factors and Comorbidities Age;Comorbidity 3+;Past/Current Experience;Time since onset of injury/illness/exacerbation    Comorbidities SOB, R TKA 2014, OA, dysrhythmia, HTN, surgical repair of L hand, hip replacement 2011, carpal tunnel release    PT Frequency 2x / week    PT Duration 8 weeks    PT Treatment/Interventions ADLs/Self Care Home Management;Cryotherapy;Electrical Stimulation;Iontophoresis 33m/ml Dexamethasone;Moist Heat;Balance training;Therapeutic exercise;Therapeutic  activities;Functional mobility training;Stair training;Gait training;Ultrasound;DME Instruction;Neuromuscular re-education;Patient/family education;Manual techniques;Vasopneumatic Device;Taping;Energy conservation;Dry needling;Passive range of motion;Scar mobilization    PT Next Visit Plan assess remaining goals; determine whether 30 day hold or recert    Consulted and Agree with Plan of Care Patient             Patient will benefit from skilled therapeutic intervention in order to improve the following deficits and impairments:  Abnormal gait, Hypomobility, Increased edema, Decreased scar mobility, Decreased activity tolerance, Decreased strength, Increased fascial restricitons, Pain, Increased muscle spasms, Difficulty walking, Decreased balance, Decreased range of motion, Improper body mechanics, Postural dysfunction, Impaired flexibility  Visit Diagnosis: Acute pain of left knee  Stiffness of left knee, not elsewhere classified  Muscle weakness (generalized)  Localized edema  Other abnormalities of gait and mobility     Problem List Patient Active Problem List   Diagnosis Date Noted   Primary osteoarthritis of left knee 10/17/2020   Status post total left knee replacement 10/17/2020   Status post total knee replacement, left 10/17/2020   S/P total knee arthroplasty    HTN (hypertension)    Osteoarthritis    PVC (premature ventricular contraction) 03/13/2013   Other specified cardiac dysrhythmias(427.89) 03/13/2013    Artist Pais, PTA 12/16/2020, 12:13 PM  [facility] [address]  [place], [place], [address] Phone: [phone]   Fax:  [phone]  Name: Maria Rowland MRN: [id number] Date of Birth: January 12, 1941

## 2020-12-19 ENCOUNTER — Ambulatory Visit: Payer: Medicare HMO | Admitting: Physical Therapy

## 2020-12-19 ENCOUNTER — Encounter: Payer: Self-pay | Admitting: Physical Therapy

## 2020-12-19 ENCOUNTER — Other Ambulatory Visit: Payer: Self-pay

## 2020-12-19 DIAGNOSIS — R2689 Other abnormalities of gait and mobility: Secondary | ICD-10-CM | POA: Diagnosis not present

## 2020-12-19 DIAGNOSIS — M6281 Muscle weakness (generalized): Secondary | ICD-10-CM

## 2020-12-19 DIAGNOSIS — M25662 Stiffness of left knee, not elsewhere classified: Secondary | ICD-10-CM | POA: Diagnosis not present

## 2020-12-19 DIAGNOSIS — M25562 Pain in left knee: Secondary | ICD-10-CM

## 2020-12-19 DIAGNOSIS — R600 Localized edema: Secondary | ICD-10-CM

## 2020-12-19 NOTE — Therapy (Signed)
[facility] [address]  [place], [place], [address] Phone: [phone]   Fax:  [phone]  Physical Therapy Treatment  Patient Details  Name: Maria Rowland MRN: [id number] Date of Birth: 07-09-1940 Referring Provider (PT): Dorna Leitz, MD   Encounter Date: 12/19/2020   PT End of Session - 12/19/20 1056     Visit Number 12    Number of Visits 17    Date for PT Re-Evaluation 12/25/20    Authorization Type [employer] & [employer]    PT Start Time 1016    PT Stop Time 1054    PT Time Calculation (min) 38 min    Activity Tolerance Patient tolerated treatment well    Behavior During Therapy WFL for tasks assessed/performed             Past Medical History:  Diagnosis Date   Constipation    Dysrhythmia    papitations   HTN (hypertension)    Irregular heart beat    Osteoarthritis    lt shoulder,knees, hips,   S/P total knee arthroplasty    Shortness of breath    when she has palpitations    Past Surgical History:  Procedure Laterality Date   APPENDECTOMY     CARPAL TUNNEL RELEASE     CHOLECYSTECTOMY     COLONOSCOPY     EYE SURGERY Right    cataract with lens implant   hip replacement 2011     surgical repair of left hand     TONSILLECTOMY     TOTAL KNEE ARTHROPLASTY Right 04/09/2013   Procedure: TOTAL KNEE ARTHROPLASTY;  Surgeon: Vickey Huger, MD;  Location: [facility];  Service: Orthopedics;  Laterality: Right;   TOTAL KNEE ARTHROPLASTY Left 10/17/2020   Procedure: TOTAL KNEE ARTHROPLASTY;  Surgeon: Dorna Leitz, MD;  Location: [facility];  Service: Orthopedics;  Laterality: Left;    There were no vitals filed for this visit.   Subjective Assessment - 12/19/20 1018     Subjective Doing pretty well. Was worried she was behind after vacation but is actually doing better.    Pertinent History SOB, R TKA 2014, OA, dysrhythmia, HTN, surgical repair of L hand, hip replacement 2011, carpal tunnel release     Diagnostic tests none recent    Patient Stated Goals decrease pain, increase motion    Currently in Pain? No/denies                               [facility] Adult PT Treatment/Exercise - 12/19/20 0001       Ambulation/Gait   Stairs Yes    Stairs Assistance 6: Modified independent (Device/Increase time)    Number of Stairs 13    Height of Stairs 8    Gait Comments altnernating reciprocal pattern up/down with use of handrail to "hoist" self up and decreased eccentric control B descending      Knee/Hip Exercises: Stretches   Passive Hamstring Stretch Left;2 reps;30 seconds    Passive Hamstring Stretch Limitations with strap + DF    Hip Flexor Stretch Left;2 reps;30 seconds    Hip Flexor Stretch Limitations mod thomas with strap      Knee/Hip Exercises: Aerobic   Recumbent Bike L3x47mn      Knee/Hip Exercises: Standing   Terminal Knee Extension Strengthening;Left;10 reps;Theraband    Terminal Knee Extension Limitations 10x3" with ball    Forward Step Up Left;1 set;10 reps;Hand Hold:  1;Step Height: 6"    Forward Step Up Limitations L step up, back slow   cueing to avoid hesitation to unlock L knee upon step back   Wall Squat 1 set;10 reps    Wall Squat Limitations depth to tolerance      Knee/Hip Exercises: Supine   Quad Sets Strengthening;Left;1 set;10 reps    Quad Sets Limitations 10x5" with 1/2 bolster under ankle      Manual Therapy   Manual Therapy Joint mobilization    Manual therapy comments supine    Joint Mobilization L patellar jt mobs in all directions grade III/IV   severe hypomobility, however symmetrical to opposite LE                   PT Education - 12/19/20 1055     Education Details update to [employer]) Educated Patient    Methods Explanation;Demonstration;Tactile cues;Verbal cues;Handout    Comprehension Verbalized understanding;Returned demonstration              PT Short Term Goals - 11/11/20 1358       PT  SHORT TERM GOAL #1   Title Patient to be independent with initial HEP.    Time 3    Period Weeks    Status Achieved    Target Date 11/20/20               PT Long Term Goals - 12/16/20 1130       PT LONG TERM GOAL #1   Title Patient to be independent with advanced HEP.    Status On-going      PT LONG TERM GOAL #2   Title Patient to demonstrate  L knee AROM/PROM WFL.    Status Partially Met   2-117 deg AROM     PT LONG TERM GOAL #3   Title Patient to demonstrate >=4+/5 strength in B LEs.    Status On-going      PT LONG TERM GOAL #4   Title Patient to demonstrate reciprocal stair climbing up/down 13 steps with 1 handrail without pain limiting and good quad stability.    Status On-going      PT LONG TERM GOAL #5   Title Patient to demonstrate adequate step length, heel-toe pattern, and knee flexion with ambulation with LRAD.    Status Partially Met   min decreased step length with R LE and decreased heel to toe pattern                  Plan - 12/19/20 1056     Clinical Impression Statement Patient without complaints at start of today's session. Patent demonstrated severe hypomobility with L patellar mobs, however this was symmetrical to opposite LE. Proceeded to work on L knee extension ther-ex and stretching for TKE. Upon discussion with patient, she reported some remaining weakness with stairs and having to use a shower seat, which she did not need at baseline. Assessed stair navigation with patient demonstrating alternating reciprocal pattern with heavy handrail use and tendency to "hoist" self up when ascending and demonstrating decreased eccentric control B when descending. Patient performed step up/downs with cueing to avoid hesitation to unlock L knee upon step back. Other progressive quad strengthening ther-ex was performed with good form and tolerance. Patient reported understanding of HEP for quad strengthening and without complaints at end of session.     Personal Factors and Comorbidities Age;Comorbidity 3+;Past/Current Experience;Time since onset of injury/illness/exacerbation    Comorbidities SOB, R  TKA 2014, OA, dysrhythmia, HTN, surgical repair of L hand, hip replacement 2011, carpal tunnel release    PT Frequency 2x / week    PT Duration 8 weeks    PT Treatment/Interventions ADLs/Self Care Home Management;Cryotherapy;Electrical Stimulation;Iontophoresis 76m/ml Dexamethasone;Moist Heat;Balance training;Therapeutic exercise;Therapeutic activities;Functional mobility training;Stair training;Gait training;Ultrasound;DME Instruction;Neuromuscular re-education;Patient/family education;Manual techniques;Vasopneumatic Device;Taping;Energy conservation;Dry needling;Passive range of motion;Scar mobilization    PT Next Visit Plan assess remaining goals; determine whether 30 day hold or recert    Consulted and Agree with Plan of Care Patient             Patient will benefit from skilled therapeutic intervention in order to improve the following deficits and impairments:  Abnormal gait, Hypomobility, Increased edema, Decreased scar mobility, Decreased activity tolerance, Decreased strength, Increased fascial restricitons, Pain, Increased muscle spasms, Difficulty walking, Decreased balance, Decreased range of motion, Improper body mechanics, Postural dysfunction, Impaired flexibility  Visit Diagnosis: Acute pain of left knee  Stiffness of left knee, not elsewhere classified  Muscle weakness (generalized)  Localized edema  Other abnormalities of gait and mobility     Problem List Patient Active Problem List   Diagnosis Date Noted   Primary osteoarthritis of left knee 10/17/2020   Status post total left knee replacement 10/17/2020   Status post total knee replacement, left 10/17/2020   S/P total knee arthroplasty    HTN (hypertension)    Osteoarthritis    PVC (premature ventricular contraction) 03/13/2013   Other specified cardiac  dysrhythmias(427.89) 03/13/2013     YJanene Harvey PT, DPT 12/19/20 10:59 AM    [facility] [address] [place] [address]Phone: [phone]  Fax:  [phone] Name: Maria BoulayMRN: [id number] of Birth: 206/16/1942

## 2020-12-23 ENCOUNTER — Encounter: Payer: Self-pay | Admitting: Physical Therapy

## 2020-12-23 ENCOUNTER — Ambulatory Visit: Payer: Medicare HMO | Attending: Orthopedic Surgery | Admitting: Physical Therapy

## 2020-12-23 ENCOUNTER — Other Ambulatory Visit: Payer: Self-pay

## 2020-12-23 DIAGNOSIS — M25562 Pain in left knee: Secondary | ICD-10-CM | POA: Insufficient documentation

## 2020-12-23 DIAGNOSIS — M25662 Stiffness of left knee, not elsewhere classified: Secondary | ICD-10-CM | POA: Insufficient documentation

## 2020-12-23 DIAGNOSIS — R600 Localized edema: Secondary | ICD-10-CM | POA: Diagnosis not present

## 2020-12-23 DIAGNOSIS — R2689 Other abnormalities of gait and mobility: Secondary | ICD-10-CM | POA: Insufficient documentation

## 2020-12-23 DIAGNOSIS — M6281 Muscle weakness (generalized): Secondary | ICD-10-CM | POA: Diagnosis not present

## 2020-12-23 NOTE — Patient Instructions (Signed)
Access Code: [email or web address] URL: [email or web address] Date: 12/23/2020 Prepared by: Glenetta Hew  Exercises Supine Bridge - 1 x daily - 7 x weekly - 2 sets - 10 reps Clamshell - 1 x daily - 7 x weekly - 2 sets - 10 reps Lateral Step Up with Counter Support - 1 x daily - 7 x weekly - 2 sets - 10 reps

## 2020-12-23 NOTE — Therapy (Signed)
[facility] [address]  [place], [place], [address] Phone: [phone]   Fax:  [phone]  Physical Therapy Discharge   PHYSICAL THERAPY DISCHARGE SUMMARY  Visits from Start of Care: 13  Current functional level related to goals / functional outcomes: independent   Remaining deficits: Hip flexor/hip glut weakness 4/5 bil   Education / Equipment: Education on importance regular exercise, continuing HEP, community based exercise programs.     Plan: Patient agrees to discharge.  Patient goals were met. Patient is being discharged due to meeting the stated rehab goals.       Patient Details  Name: Maria Rowland MRN: [id number] Date of Birth: [birth year] Referring Provider (PT): Dorna Leitz, MD   Encounter Date: 12/23/2020   PT End of Session - 12/23/20 1142     Visit Number 13    Number of Visits 17    Date for PT Re-Evaluation 12/25/20    Authorization Type [employer]    PT Start Time 1107    PT Stop Time 1152    PT Time Calculation (min) 45 min    Activity Tolerance Patient tolerated treatment well    Behavior During Therapy WFL for tasks assessed/performed             Past Medical History:  Diagnosis Date   Constipation    Dysrhythmia    papitations   HTN (hypertension)    Irregular heart beat    Osteoarthritis    lt shoulder,knees, hips,   S/P total knee arthroplasty    Shortness of breath    when she has palpitations    Past Surgical History:  Procedure Laterality Date   APPENDECTOMY     CARPAL TUNNEL RELEASE     CHOLECYSTECTOMY     COLONOSCOPY     EYE SURGERY Right    cataract with lens implant   hip replacement 2011     surgical repair of left hand     TONSILLECTOMY     TOTAL KNEE ARTHROPLASTY Right 04/09/2013   Procedure: TOTAL KNEE ARTHROPLASTY;  Surgeon: Vickey Huger, MD;  Location: [facility];  Service: Orthopedics;  Laterality: Right;   TOTAL KNEE ARTHROPLASTY Left  10/17/2020   Procedure: TOTAL KNEE ARTHROPLASTY;  Surgeon: Dorna Leitz, MD;  Location: [facility];  Service: Orthopedics;  Laterality: Left;    There were no vitals filed for this visit.   Subjective Assessment - 12/23/20 1115     Subjective Pt. reports she saw her MD yesterday who told her she was ready for discharge since she had good ROM and just needed to continue strengthening.  She did have a rash on her leg so he started her on an antibiotic because he was concered about cellulitus and she is to continue wearing compression sock.  Otherwise doing well, little bit of pain in posterior L knee.  She reports she can do all activities around her home now and is compliant with HEP.    Pertinent History SOB, R TKA 2014, OA, dysrhythmia, HTN, surgical repair of L hand, hip replacement 2011, carpal tunnel release    Limitations Sitting;Lifting;Standing;Walking;House hold activities    Patient Stated Goals decrease pain, increase motion    Currently in Pain? Yes    Pain Score 1     Pain Location Knee    Pain Orientation Left;Posterior    Pain Descriptors / Indicators Sore    Pain Type Surgical pain  [facility] PT Assessment - 12/23/20 0001       Assessment   Medical Diagnosis s/p L TKA    Referring Provider (PT) John Graves, MD    Onset Date/Surgical Date 10/17/20    Hand Dominance Right      Observation/Other Assessments   Focus on Therapeutic Outcomes (FOTO)  53%      ROM / Strength   AROM / PROM / Strength Strength      Strength   Overall Strength Deficits    Strength Assessment Site Hip;Knee    Right/Left Hip Left    Right Hip Flexion 4/5    Right Hip Extension 4/5    Right Hip ABduction 4+/5    Right Hip ADduction 4+/5    Left Hip Flexion 4/5    Left Hip Extension 4/5    Left Hip ABduction 4+/5    Left Hip ADduction 4+/5    Right Knee Flexion 4+/5    Right Knee Extension 4+/5    Left Knee Flexion 4+/5    Left Knee Extension 4+/5                            [facility] Adult PT Treatment/Exercise - 12/23/20 0001       Ambulation/Gait   Stairs Yes    Stairs Assistance 6: Modified independent (Device/Increase time)    Stair Management Technique One rail Right    Number of Stairs 13    Height of Stairs 8    Gait Comments altnernating reciprocal pattern up/down with use of handrail to "hoist" self up and decreased eccentric control B descending      Exercises   Exercises Knee/Hip      Knee/Hip Exercises: Aerobic   Tread Mill x 5 min with SBA for safety, 0.5 mph    Recumbent Bike L3x6min      Knee/Hip Exercises: Standing   Lateral Step Up Both;1 set;10 reps    Lateral Step Up Limitations UE support    Forward Step Up Both;2 sets;10 reps    Forward Step Up Limitations 2 UE support      Knee/Hip Exercises: Supine   Bridges Strengthening;2 sets;10 reps      Knee/Hip Exercises: Sidelying   Clams x 10 L side, cues for technique      Knee/Hip Exercises: Prone   Hamstring Curl 10 reps    Hip Extension Left    Hip Extension Limitations unable                    PT Education - 12/23/20 1205     Education Details updated HEP, recommendations for continued physical activity (walking at mall, joining gym)    Person(s) Educated Patient    Methods Explanation;Verbal cues;Handout    Comprehension Verbalized understanding;Returned demonstration              PT Short Term Goals - 11/11/20 1358       PT SHORT TERM GOAL #1   Title Patient to be independent with initial HEP.    Time 3    Period Weeks    Status Achieved    Target Date 11/20/20               PT Long Term Goals - 12/23/20 1117       PT LONG TERM GOAL #1   Title Patient to be independent with advanced HEP.    Status Achieved      PT LONG   TERM GOAL #2   Title Patient to demonstrate  L knee AROM/PROM WFL.    Met, 0-115 deg.    Time 8    Period Weeks    Status Achieved   2-117 deg AROM     PT LONG TERM GOAL #3   Title  Patient to demonstrate >=4+/5 strength in B LEs.  12/23/2020 - knee strength 4+/5 bil, however weakness in hip flexors, glutes 4/5    Status Achieved      PT LONG TERM GOAL #4   Title Patient to demonstrate reciprocal stair climbing up/down 13 steps with 1 handrail without pain limiting and good quad stability.    Status Achieved      PT LONG TERM GOAL #5   Title Patient to demonstrate adequate step length, heel-toe pattern, and knee flexion with ambulation with LRAD.    Status Achieved   min decreased step length with R LE and decreased heel to toe pattern                  Plan - 12/23/20 1220     Clinical Impression Statement Patient has made good progress and has met all goals for L knee strength and ROM.  She does have some hip flexor and glut weakness, so focused progression of exercises for strengthening today.  also discussed througout session importance of continuing to be active and compliant with HEP, and recommended joining gym through [employer] program or walking daily at mall.  She will check out local programs to see what she likes.  Her L knee ROM was 0-115 deg today and her FOTO has improved to 53% from 33% on initial evaluation.  She is ready for discharge.    Personal Factors and Comorbidities Age;Comorbidity 3+;Past/Current Experience;Time since onset of injury/illness/exacerbation    Comorbidities SOB, R TKA 2014, OA, dysrhythmia, HTN, surgical repair of L hand, hip replacement 2011, carpal tunnel release    PT Frequency 2x / week    PT Duration 8 weeks    PT Treatment/Interventions ADLs/Self Care Home Management;Cryotherapy;Electrical Stimulation;Iontophoresis 60m/ml Dexamethasone;Moist Heat;Balance training;Therapeutic exercise;Therapeutic activities;Functional mobility training;Stair training;Gait training;Ultrasound;DME Instruction;Neuromuscular re-education;Patient/family education;Manual techniques;Vasopneumatic Device;Taping;Energy conservation;Dry  needling;Passive range of motion;Scar mobilization    PT Home Exercise Plan progressed.    Consulted and Agree with Plan of Care Patient             Patient will benefit from skilled therapeutic intervention in order to improve the following deficits and impairments:  Abnormal gait, Hypomobility, Increased edema, Decreased scar mobility, Decreased activity tolerance, Decreased strength, Increased fascial restricitons, Pain, Increased muscle spasms, Difficulty walking, Decreased balance, Decreased range of motion, Improper body mechanics, Postural dysfunction, Impaired flexibility  Visit Diagnosis: Acute pain of left knee  Stiffness of left knee, not elsewhere classified  Muscle weakness (generalized)  Localized edema  Other abnormalities of gait and mobility     Problem List Patient Active Problem List   Diagnosis Date Noted   Primary osteoarthritis of left knee 10/17/2020   Status post total left knee replacement 10/17/2020   Status post total knee replacement, left 10/17/2020   S/P total knee arthroplasty    HTN (hypertension)    Osteoarthritis    PVC (premature ventricular contraction) 03/13/2013   Other specified cardiac dysrhythmias(427.89) 03/13/2013    ERennie NatterPT, DPT 12/23/2020, 12:24 PM  [facility] [address] [place] [address]Phone: [phone]  Fax:  [phone] Name:  Maria Rowland MRN: [id number] Date of Birth: [birth year]

## 2021-01-07 DIAGNOSIS — I10 Essential (primary) hypertension: Secondary | ICD-10-CM | POA: Diagnosis not present

## 2021-01-07 DIAGNOSIS — I878 Other specified disorders of veins: Secondary | ICD-10-CM | POA: Diagnosis not present

## 2021-01-07 DIAGNOSIS — M1712 Unilateral primary osteoarthritis, left knee: Secondary | ICD-10-CM | POA: Diagnosis not present

## 2021-02-02 DIAGNOSIS — Z96652 Presence of left artificial knee joint: Secondary | ICD-10-CM | POA: Diagnosis not present

## 2021-02-02 DIAGNOSIS — Z471 Aftercare following joint replacement surgery: Secondary | ICD-10-CM | POA: Diagnosis not present

## 2021-03-05 DIAGNOSIS — H524 Presbyopia: Secondary | ICD-10-CM | POA: Diagnosis not present

## 2021-03-05 DIAGNOSIS — H5203 Hypermetropia, bilateral: Secondary | ICD-10-CM | POA: Diagnosis not present

## 2021-03-05 DIAGNOSIS — H43813 Vitreous degeneration, bilateral: Secondary | ICD-10-CM | POA: Diagnosis not present

## 2021-03-05 DIAGNOSIS — Z83511 Family history of glaucoma: Secondary | ICD-10-CM | POA: Diagnosis not present

## 2021-03-05 DIAGNOSIS — H0100A Unspecified blepharitis right eye, upper and lower eyelids: Secondary | ICD-10-CM | POA: Diagnosis not present

## 2021-03-05 DIAGNOSIS — H0100B Unspecified blepharitis left eye, upper and lower eyelids: Secondary | ICD-10-CM | POA: Diagnosis not present

## 2021-03-05 DIAGNOSIS — H26491 Other secondary cataract, right eye: Secondary | ICD-10-CM | POA: Diagnosis not present

## 2021-03-05 DIAGNOSIS — H52203 Unspecified astigmatism, bilateral: Secondary | ICD-10-CM | POA: Diagnosis not present

## 2021-03-05 DIAGNOSIS — Z961 Presence of intraocular lens: Secondary | ICD-10-CM | POA: Diagnosis not present

## 2021-06-12 DIAGNOSIS — Z1331 Encounter for screening for depression: Secondary | ICD-10-CM | POA: Diagnosis not present

## 2021-06-12 DIAGNOSIS — Z1389 Encounter for screening for other disorder: Secondary | ICD-10-CM | POA: Diagnosis not present

## 2021-06-12 DIAGNOSIS — M1712 Unilateral primary osteoarthritis, left knee: Secondary | ICD-10-CM | POA: Diagnosis not present

## 2021-06-12 DIAGNOSIS — R609 Edema, unspecified: Secondary | ICD-10-CM | POA: Diagnosis not present

## 2021-06-12 DIAGNOSIS — I10 Essential (primary) hypertension: Secondary | ICD-10-CM | POA: Diagnosis not present

## 2021-06-12 DIAGNOSIS — Z0000 Encounter for general adult medical examination without abnormal findings: Secondary | ICD-10-CM | POA: Diagnosis not present

## 2021-06-12 DIAGNOSIS — I878 Other specified disorders of veins: Secondary | ICD-10-CM | POA: Diagnosis not present

## 2021-11-06 DIAGNOSIS — I10 Essential (primary) hypertension: Secondary | ICD-10-CM | POA: Diagnosis not present

## 2021-11-06 DIAGNOSIS — Z9181 History of falling: Secondary | ICD-10-CM | POA: Diagnosis not present

## 2021-11-06 DIAGNOSIS — Z833 Family history of diabetes mellitus: Secondary | ICD-10-CM | POA: Diagnosis not present

## 2021-11-06 DIAGNOSIS — Z8673 Personal history of transient ischemic attack (TIA), and cerebral infarction without residual deficits: Secondary | ICD-10-CM | POA: Diagnosis not present

## 2021-11-06 DIAGNOSIS — Z96642 Presence of left artificial hip joint: Secondary | ICD-10-CM | POA: Diagnosis not present

## 2021-11-06 DIAGNOSIS — Z791 Long term (current) use of non-steroidal anti-inflammatories (NSAID): Secondary | ICD-10-CM | POA: Diagnosis not present

## 2021-11-06 DIAGNOSIS — Z008 Encounter for other general examination: Secondary | ICD-10-CM | POA: Diagnosis not present

## 2021-11-06 DIAGNOSIS — I2510 Atherosclerotic heart disease of native coronary artery without angina pectoris: Secondary | ICD-10-CM | POA: Diagnosis not present

## 2021-11-06 DIAGNOSIS — N3941 Urge incontinence: Secondary | ICD-10-CM | POA: Diagnosis not present

## 2021-11-06 DIAGNOSIS — Z809 Family history of malignant neoplasm, unspecified: Secondary | ICD-10-CM | POA: Diagnosis not present

## 2021-11-06 DIAGNOSIS — M1990 Unspecified osteoarthritis, unspecified site: Secondary | ICD-10-CM | POA: Diagnosis not present

## 2021-11-06 DIAGNOSIS — Z96653 Presence of artificial knee joint, bilateral: Secondary | ICD-10-CM | POA: Diagnosis not present

## 2021-11-13 DIAGNOSIS — S7002XA Contusion of left hip, initial encounter: Secondary | ICD-10-CM | POA: Diagnosis not present

## 2021-12-25 DIAGNOSIS — I10 Essential (primary) hypertension: Secondary | ICD-10-CM | POA: Diagnosis not present

## 2021-12-25 DIAGNOSIS — R600 Localized edema: Secondary | ICD-10-CM | POA: Diagnosis not present

## 2021-12-25 DIAGNOSIS — M1712 Unilateral primary osteoarthritis, left knee: Secondary | ICD-10-CM | POA: Diagnosis not present

## 2022-03-19 DIAGNOSIS — H04123 Dry eye syndrome of bilateral lacrimal glands: Secondary | ICD-10-CM | POA: Diagnosis not present

## 2022-03-19 DIAGNOSIS — H26491 Other secondary cataract, right eye: Secondary | ICD-10-CM | POA: Diagnosis not present

## 2022-03-19 DIAGNOSIS — H5203 Hypermetropia, bilateral: Secondary | ICD-10-CM | POA: Diagnosis not present

## 2022-03-19 DIAGNOSIS — H0100B Unspecified blepharitis left eye, upper and lower eyelids: Secondary | ICD-10-CM | POA: Diagnosis not present

## 2022-03-19 DIAGNOSIS — Z83511 Family history of glaucoma: Secondary | ICD-10-CM | POA: Diagnosis not present

## 2022-03-19 DIAGNOSIS — H43813 Vitreous degeneration, bilateral: Secondary | ICD-10-CM | POA: Diagnosis not present

## 2022-03-19 DIAGNOSIS — H0100A Unspecified blepharitis right eye, upper and lower eyelids: Secondary | ICD-10-CM | POA: Diagnosis not present

## 2022-03-19 DIAGNOSIS — H52203 Unspecified astigmatism, bilateral: Secondary | ICD-10-CM | POA: Diagnosis not present

## 2022-03-19 DIAGNOSIS — H524 Presbyopia: Secondary | ICD-10-CM | POA: Diagnosis not present

## 2022-03-19 DIAGNOSIS — Z961 Presence of intraocular lens: Secondary | ICD-10-CM | POA: Diagnosis not present

## 2022-08-03 ENCOUNTER — Telehealth: Payer: Self-pay

## 2022-08-03 NOTE — Patient Outreach (Signed)
  Care Coordination   Initial Visit Note   08/03/2022 Name: Maria Rowland MRN: [id number] DOB: 1941-03-23  Maria Rowland is a 82 y.o. year old female who sees Leeroy Cha, MD for primary care. I spoke with  Ernie Avena by phone today.  What matters to the patients health and wellness today?  My shoulder is still having pain and cramps.  States she is to her doctor on 08/23/22 and plans to discuss her pain     Goals Addressed             This Visit's Progress    Care Coordination Activities- self management of chronic pain       Care Coordination Interventions: Reviewed provider established plan for pain management Discussed importance of adherence to all scheduled medical appointments Counseled on the importance of reporting any/all new or changed pain symptoms or management strategies to pain management provider Advised patient to report to care team affect of pain on daily activities Discussed use of relaxation techniques and/or diversional activities to assist with pain reduction (distraction, imagery, relaxation, massage, acupressure, TENS, heat, and cold application Interventions Today    Flowsheet Row Most Recent Value  Chronic Disease   Chronic disease during today's visit Hypertension (HTN), Other  [shoulder pain]  General Interventions   General Interventions Discussed/Reviewed General Interventions Discussed, Doctor Visits  Doctor Visits Discussed/Reviewed Doctor Visits Discussed, Annual Wellness Visits, PCP  Education Interventions   Education Provided Provided Education, Provided Printed Education  Provided Verbal Education On Nutrition, Other  [Reviewed ways to help with chronic pain.  Discussed to talk to MD on 08/23/22 visit about shoulder pain]  Nutrition Interventions   Nutrition Discussed/Reviewed Nutrition Discussed, Decreasing salt               SDOH assessments and interventions completed:  Yes  SDOH Interventions Today    Flowsheet Row Most  Recent Value  SDOH Interventions   Food Insecurity Interventions Intervention Not Indicated  Housing Interventions Intervention Not Indicated  Transportation Interventions Intervention Not Indicated  Utilities Interventions Intervention Not Indicated        Care Coordination Interventions:  Yes, provided   Follow up plan: Follow up call scheduled for 08/30/22    Encounter Outcome:  Pt. Visit Completed  Peter Garter RN, [facility], [place] Management [phone]

## 2022-08-03 NOTE — Patient Instructions (Signed)
Visit Information  Thank you for taking time to visit with me today. Please don't hesitate to contact me if I can be of assistance to you.   Following are the goals we discussed today:   Goals Addressed             This Visit's Progress    Care Coordination Activities- self management of chronic pain       Care Coordination Interventions: Reviewed provider established plan for pain management Discussed importance of adherence to all scheduled medical appointments Counseled on the importance of reporting any/all new or changed pain symptoms or management strategies to pain management provider Advised patient to report to care team affect of pain on daily activities Discussed use of relaxation techniques and/or diversional activities to assist with pain reduction (distraction, imagery, relaxation, massage, acupressure, TENS, heat, and cold application Interventions Today    Flowsheet Row Most Recent Value  Chronic Disease   Chronic disease during today's visit Hypertension (HTN), Other  [shoulder pain]  General Interventions   General Interventions Discussed/Reviewed General Interventions Discussed, Doctor Visits  Doctor Visits Discussed/Reviewed Doctor Visits Discussed, Annual Wellness Visits, PCP  Education Interventions   Education Provided Provided Education, Provided Printed Education  Provided Verbal Education On Nutrition, Other  [Reviewed ways to help with chronic pain.  Discussed to talk to MD on 08/23/22 visit about shoulder pain]  Nutrition Interventions   Nutrition Discussed/Reviewed Nutrition Discussed, Decreasing salt               Our next appointment is by telephone on 08/30/22 at 2 PM  Please call the care guide team at [phone] if you need to cancel or reschedule your appointment.   If you are experiencing a Mental Health or [place] or need someone to talk to, please call the Suicide and Crisis Lifeline: 988 call the [place] National Suicide  Prevention Lifeline: 234-476-5829 or TTY: 478-752-3020 TTY 765-109-1045) to talk to a trained counselor call 1-800-273-TALK (toll free, 24 hour hotline) go to [facility] [address] [place] [phone]) call 911   The patient verbalized understanding of instructions, educational materials, and care plan provided today and agreed to receive a mailed copy of patient instructions, educational materials, and care plan.   Telephone follow up appointment with care management team member scheduled for:08/30/22  SIGNATURE Peter Garter RN, [facility], [place] Management Coordinator [place] [facility] [phone]

## 2022-08-23 DIAGNOSIS — M85859 Other specified disorders of bone density and structure, unspecified thigh: Secondary | ICD-10-CM | POA: Diagnosis not present

## 2022-08-23 DIAGNOSIS — Z0000 Encounter for general adult medical examination without abnormal findings: Secondary | ICD-10-CM | POA: Diagnosis not present

## 2022-08-23 DIAGNOSIS — M47812 Spondylosis without myelopathy or radiculopathy, cervical region: Secondary | ICD-10-CM | POA: Diagnosis not present

## 2022-08-23 DIAGNOSIS — M1610 Unilateral primary osteoarthritis, unspecified hip: Secondary | ICD-10-CM | POA: Diagnosis not present

## 2022-08-23 DIAGNOSIS — J302 Other seasonal allergic rhinitis: Secondary | ICD-10-CM | POA: Diagnosis not present

## 2022-08-23 DIAGNOSIS — I10 Essential (primary) hypertension: Secondary | ICD-10-CM | POA: Diagnosis not present

## 2022-08-23 DIAGNOSIS — M1712 Unilateral primary osteoarthritis, left knee: Secondary | ICD-10-CM | POA: Diagnosis not present

## 2022-08-23 DIAGNOSIS — I493 Ventricular premature depolarization: Secondary | ICD-10-CM | POA: Diagnosis not present

## 2022-08-30 ENCOUNTER — Ambulatory Visit: Payer: Self-pay

## 2022-08-30 NOTE — Patient Outreach (Signed)
  Care Coordination   Follow Up Visit Note   08/30/2022 Name: Maria Rowland MRN: [id number] DOB: [birth year]  Maria Rowland is a 82 y.o. year old female who sees Lorenda Ishihara, MD for primary care. I spoke with  Maria Rowland by phone today.  What matters to the patients health and wellness today?  States she has been doing good. States her shoulder hurts from time to time.  States she has been going to the [facility] but thinks some of the machines make her shoulder hurt sometimes.    Goals Addressed             This Visit's Progress    COMPLETED: Care Coordination Activities- self management of chronic pain        Interventions Today    Flowsheet Row Most Recent Value  Chronic Disease   Chronic disease during today's visit Hypertension (HTN), Other  [chronic shoulder pain]  General Interventions   General Interventions Discussed/Reviewed General Interventions Reviewed, Doctor Visits  Doctor Visits Discussed/Reviewed Doctor Visits Reviewed, Annual Wellness Visits, PCP  PCP/Specialist Visits Compliance with follow-up visit  [Reviewed with pt her recent visit with PCP]  Exercise Interventions   Exercise Discussed/Reviewed Exercise Reviewed, Physical Activity  Physical Activity Discussed/Reviewed Gym, Types of exercise  [Reviewed to work with trainer at the [facility] to find machines that will not hurt her shoulder]  Education Interventions   Education Provided Provided Education  Provided Verbal Education On Exercise, Other, Nutrition  [Reviewed to check her B/P regularly]  Nutrition Interventions   Nutrition Discussed/Reviewed Nutrition Reviewed, Decreasing salt  Pharmacy Interventions   Pharmacy Dicussed/Reviewed Pharmacy Topics Reviewed, Medications and their functions  Safety Interventions   Safety Discussed/Reviewed Safety Reviewed, Fall Risk               SDOH assessments and interventions completed:  Yes  SDOH Interventions Today    Flowsheet Row Most Recent Value   SDOH Interventions   Physical Activity Interventions [employer]        Care Coordination Interventions:  Yes, provided   Follow up plan: No further intervention required.   Encounter Outcome:  Pt. Visit Completed  Dudley Major RN, BSN,CCM, CDE Care Management Coordinator [facility] Care Management [phone]

## 2022-08-30 NOTE — Patient Instructions (Signed)
Visit Information  Thank you for taking time to visit with me today. Please don't hesitate to contact me if I can be of assistance to you.   Following are the goals we discussed today:   Goals Addressed             This Visit's Progress    COMPLETED: Care Coordination Activities- self management of chronic pain        Interventions Today    Flowsheet Row Most Recent Value  Chronic Disease   Chronic disease during today's visit Hypertension (HTN), Other  [chronic shoulder pain]  General Interventions   General Interventions Discussed/Reviewed General Interventions Reviewed, Doctor Visits  Doctor Visits Discussed/Reviewed Doctor Visits Reviewed, Annual Wellness Visits, PCP  PCP/Specialist Visits Compliance with follow-up visit  [Reviewed with pt her recent visit with PCP]  Exercise Interventions   Exercise Discussed/Reviewed Exercise Reviewed, Physical Activity  Physical Activity Discussed/Reviewed Gym, Types of exercise  [Reviewed to work with trainer at the [facility] to find machines that will not hurt her shoulder]  Education Interventions   Education Provided Provided Education  Provided Verbal Education On Exercise, Other, Nutrition  [Reviewed to check her B/P regularly]  Nutrition Interventions   Nutrition Discussed/Reviewed Nutrition Reviewed, Decreasing salt  Pharmacy Interventions   Pharmacy Dicussed/Reviewed Pharmacy Topics Reviewed, Medications and their functions  Safety Interventions   Safety Discussed/Reviewed Safety Reviewed, Fall Risk                If you are experiencing a Mental Health or Behavioral Health Crisis or need someone to talk to, please call the Suicide and Crisis Lifeline: 988 call the [place] National Suicide Prevention Lifeline: 661-432-0973 or TTY: 334-717-1630 TTY 6504086707) to talk to a trained counselor call 1-800-273-TALK (toll free, 24 hour hotline) go to [facility] [address], [place]  [phone]) call 911   The patient verbalized understanding of instructions, educational materials, and care plan provided today and DECLINED offer to receive copy of patient instructions, educational materials, and care plan.   No further follow up required: case closed goals met  SIGNATURE Dudley Major RN, [facility], CDE Care Management Coordinator [facility] Care Management [phone]

## 2022-12-30 DIAGNOSIS — Z791 Long term (current) use of non-steroidal anti-inflammatories (NSAID): Secondary | ICD-10-CM | POA: Diagnosis not present

## 2022-12-30 DIAGNOSIS — J302 Other seasonal allergic rhinitis: Secondary | ICD-10-CM | POA: Diagnosis not present

## 2022-12-30 DIAGNOSIS — Z818 Family history of other mental and behavioral disorders: Secondary | ICD-10-CM | POA: Diagnosis not present

## 2022-12-30 DIAGNOSIS — K5900 Constipation, unspecified: Secondary | ICD-10-CM | POA: Diagnosis not present

## 2022-12-30 DIAGNOSIS — Z833 Family history of diabetes mellitus: Secondary | ICD-10-CM | POA: Diagnosis not present

## 2022-12-30 DIAGNOSIS — Z96649 Presence of unspecified artificial hip joint: Secondary | ICD-10-CM | POA: Diagnosis not present

## 2022-12-30 DIAGNOSIS — Z809 Family history of malignant neoplasm, unspecified: Secondary | ICD-10-CM | POA: Diagnosis not present

## 2022-12-30 DIAGNOSIS — I10 Essential (primary) hypertension: Secondary | ICD-10-CM | POA: Diagnosis not present

## 2022-12-30 DIAGNOSIS — Z66 Do not resuscitate: Secondary | ICD-10-CM | POA: Diagnosis not present

## 2022-12-30 DIAGNOSIS — M1990 Unspecified osteoarthritis, unspecified site: Secondary | ICD-10-CM | POA: Diagnosis not present

## 2022-12-30 DIAGNOSIS — Z008 Encounter for other general examination: Secondary | ICD-10-CM | POA: Diagnosis not present

## 2022-12-30 DIAGNOSIS — Z8249 Family history of ischemic heart disease and other diseases of the circulatory system: Secondary | ICD-10-CM | POA: Diagnosis not present

## 2022-12-30 DIAGNOSIS — N3941 Urge incontinence: Secondary | ICD-10-CM | POA: Diagnosis not present

## 2023-02-01 DIAGNOSIS — S40869A Insect bite (nonvenomous) of unspecified upper arm, initial encounter: Secondary | ICD-10-CM | POA: Diagnosis not present

## 2023-02-01 DIAGNOSIS — W57XXXA Bitten or stung by nonvenomous insect and other nonvenomous arthropods, initial encounter: Secondary | ICD-10-CM | POA: Diagnosis not present

## 2023-02-22 DIAGNOSIS — Z23 Encounter for immunization: Secondary | ICD-10-CM | POA: Diagnosis not present

## 2023-02-22 DIAGNOSIS — J302 Other seasonal allergic rhinitis: Secondary | ICD-10-CM | POA: Diagnosis not present

## 2023-02-22 DIAGNOSIS — Z0000 Encounter for general adult medical examination without abnormal findings: Secondary | ICD-10-CM | POA: Diagnosis not present

## 2023-02-22 DIAGNOSIS — I10 Essential (primary) hypertension: Secondary | ICD-10-CM | POA: Diagnosis not present

## 2023-02-22 DIAGNOSIS — D649 Anemia, unspecified: Secondary | ICD-10-CM | POA: Diagnosis not present

## 2023-02-22 DIAGNOSIS — M1610 Unilateral primary osteoarthritis, unspecified hip: Secondary | ICD-10-CM | POA: Diagnosis not present

## 2023-02-22 DIAGNOSIS — M85859 Other specified disorders of bone density and structure, unspecified thigh: Secondary | ICD-10-CM | POA: Diagnosis not present

## 2023-02-22 DIAGNOSIS — M47812 Spondylosis without myelopathy or radiculopathy, cervical region: Secondary | ICD-10-CM | POA: Diagnosis not present

## 2023-03-23 DIAGNOSIS — Z23 Encounter for immunization: Secondary | ICD-10-CM | POA: Diagnosis not present

## 2023-03-23 DIAGNOSIS — M47812 Spondylosis without myelopathy or radiculopathy, cervical region: Secondary | ICD-10-CM | POA: Diagnosis not present

## 2023-03-23 DIAGNOSIS — I10 Essential (primary) hypertension: Secondary | ICD-10-CM | POA: Diagnosis not present

## 2023-04-27 DIAGNOSIS — I10 Essential (primary) hypertension: Secondary | ICD-10-CM | POA: Diagnosis not present

## 2023-04-29 DIAGNOSIS — M542 Cervicalgia: Secondary | ICD-10-CM | POA: Diagnosis not present

## 2023-04-29 DIAGNOSIS — M67911 Unspecified disorder of synovium and tendon, right shoulder: Secondary | ICD-10-CM | POA: Diagnosis not present

## 2023-05-17 ENCOUNTER — Other Ambulatory Visit: Payer: Self-pay

## 2023-05-17 ENCOUNTER — Ambulatory Visit: Payer: PRIVATE HEALTH INSURANCE | Attending: Orthopedic Surgery

## 2023-05-17 DIAGNOSIS — M25611 Stiffness of right shoulder, not elsewhere classified: Secondary | ICD-10-CM | POA: Diagnosis not present

## 2023-05-17 DIAGNOSIS — M25511 Pain in right shoulder: Secondary | ICD-10-CM | POA: Diagnosis not present

## 2023-05-17 DIAGNOSIS — G8929 Other chronic pain: Secondary | ICD-10-CM | POA: Diagnosis not present

## 2023-05-17 NOTE — Therapy (Signed)
OUTPATIENT PHYSICAL THERAPY SHOULDER EVALUATION   Patient Name: Maria Rowland MRN: [id number] DOB:[birth year], 82 y.o., female Today's Date: 05/17/2023  END OF SESSION:  PT End of Session - 05/17/23 1431     Visit Number 1    Date for PT Re-Evaluation 07/12/23    Progress Note Due on Visit 10    PT Start Time 1144    PT Stop Time 1230    PT Time Calculation (min) 46 min    Activity Tolerance Patient tolerated treatment well;No increased pain    Behavior During Therapy WFL for tasks assessed/performed             Past Medical History:  Diagnosis Date   Constipation    Dysrhythmia    papitations   HTN (hypertension)    Irregular heart beat    Osteoarthritis    lt shoulder,knees, hips,   S/P total knee arthroplasty    Shortness of breath    when she has palpitations   Past Surgical History:  Procedure Laterality Date   APPENDECTOMY     CARPAL TUNNEL RELEASE     CHOLECYSTECTOMY     COLONOSCOPY     EYE SURGERY Right    cataract with lens implant   hip replacement 2011     surgical repair of left hand     TONSILLECTOMY     TOTAL KNEE ARTHROPLASTY Right 04/09/2013   Procedure: TOTAL KNEE ARTHROPLASTY;  Surgeon: Dannielle Huh, MD;  Location: [facility];  Service: Orthopedics;  Laterality: Right;   TOTAL KNEE ARTHROPLASTY Left 10/17/2020   Procedure: TOTAL KNEE ARTHROPLASTY;  Surgeon: Jodi Geralds, MD;  Location: [facility];  Service: Orthopedics;  Laterality: Left;   Patient Active Problem List   Diagnosis Date Noted   Primary osteoarthritis of left knee 10/17/2020   Status post total left knee replacement 10/17/2020   Status post total knee replacement, left 10/17/2020   S/P total knee arthroplasty    HTN (hypertension)    Osteoarthritis    PVC (premature ventricular contraction) 03/13/2013   Other specified cardiac dysrhythmias(427.89) 03/13/2013    PCP: Lorenda Ishihara, MD  REFERRING PROVIDER: Jessy Oto orthopedist  REFERRING DIAG: shoulder  pain  THERAPY DIAG:  Chronic right shoulder pain  Decreased range of motion of right shoulder  Rationale for Evaluation and Treatment: Rehabilitation  ONSET DATE: October 2024  SUBJECTIVE:                                                                                                                                                                                      SUBJECTIVE STATEMENT: The pt reports overall her shoulder has gradually improved since initial  incident.  She is not really sure that she needs PT.  She did accidentally drop a piece of her artificial Christmas tree on her R ant arm 3 weeks ago and had severe bruising, which made her arm worse for a little while, now better.  Occasionally catches with specific movements. Hand dominance: Right  PERTINENT HISTORY: Was raking leaves a few months ago, R shoulder with pain afterwards.  Saw orthopedist and was referred to PT.   PAIN:  Are you having pain? Yes: NPRS scale: 0 to 4 Pain location: R lat shoulder  Pain description: comes and goes Aggravating factors: painful arc with R shoulder flex and abduction Relieving factors: avoiding aggravating movements  PRECAUTIONS: None  RED FLAGS: None   WEIGHT BEARING RESTRICTIONS: No  FALLS:  Has patient fallen in last 6 months? No  LIVING ENVIRONMENT: Lives with: lives alone Lives in: House/apartment Stairs: No Has following equipment at home: None  OCCUPATION: retired  PLOF: Independent  PATIENT GOALS:I want to see if it gets better as I work with it   NEXT MD VISIT:   OBJECTIVE:  Note: Objective measures were completed at Evaluation unless otherwise noted.  DIAGNOSTIC FINDINGS:  Not available, pt reports that the orthopedist said she had a spur  PATIENT SURVEYS:  Quick Dash QuickDASH Score: 29.5 / 100 = 29.5 %  COGNITION: Overall cognitive status: Within functional limits for tasks assessed     SENSATION: WFL  POSTURE: Forward head, rounded  shoulders  UPPER EXTREMITY ROM: AROM R shoulder: flexion wfl with painful arc 80 to terminal ROM Abduction wfl Hand behind back to L2 Hand behind head to T1, reports"stiff"  Passive ROM Right eval Left eval  Shoulder flexion 155   Shoulder extension    Shoulder abduction 116   Shoulder adduction    Shoulder internal rotation Wfl:80   Shoulder external rotation 90   Elbow flexion    Elbow extension    Wrist flexion    Wrist extension    Wrist ulnar deviation    Wrist radial deviation    Wrist pronation    Wrist supination    (Blank rows = not tested)  UPPER EXTREMITY MMT: all MMT wnl R UE, p! R shoulder resisted abduction  SHOULDER SPECIAL TESTS: Impingement tests: Neer impingement test: positive  and Painful arc test: positive    Rotator cuff assessment: Empty can test: positive , External rotation lag sign: negative, and Belly press test: negative  JOINT MOBILITY TESTING:  Decreased mobility for A/P motion R humeral head , inferior glides wfl  PALPATION:  Pt tender R ant jt line GH jt and R superior shoulder jt line                                                                                                                             TREATMENT DATE: 05/17/23 Evaluation, utilized A/P glides R humeral head 2 x 30 sec bouts, followed by AAROM R shoulder, all  planes, consistent pain and crepitus R shoulder abduction at 90 degrees  Inst in R shoulder adduction isometrics 5 sec holds, with towel roll in axilla to gap R GH jt line , no change in ROM R shoulder after this activity Therefore inst in pendulum ex R shoulder with 2 # dumbell, initially tried 3# but too heavy for pt.    PATIENT EDUCATION: Education details: POC, goals Person educated: Patient Education method: Explanation, Demonstration, Tactile cues, and Verbal cues Education comprehension: verbalized understanding, returned demonstration, verbal cues required, tactile cues required, and needs further  education  HOME EXERCISE PROGRAM: Pendulum R shoulder  ASSESSMENT:  CLINICAL IMPRESSION: Patient is a 82 y.o. female who was evaluated  today by skilled physical therapy due to R shoulder rotator cuff pathology.  Overall since her initial orthopedic visit she reports improved Sx, does have a large , dark hematoma R ant and upper arm, due to part of christmas tree fell on her arm a couple of weeks ago. She is restricted with certain movements, particularly with abduction in supine and flexion in sitting, no weakness of her shoulder musculature. She did not improve much with her motion with manual techniques, so provided with pendulum ex to provide some jt spacing of her GH jt.  She wished after discussion to utilize the pendulum ex over the next month and will call back if she does not improve or if her Sx worsen. OBJECTIVE IMPAIRMENTS: decreased ROM, hypomobility, increased fascial restrictions, impaired flexibility, impaired UE functional use, and pain.   ACTIVITY LIMITATIONS: lifting, reach over head, and hygiene/grooming  PARTICIPATION LIMITATIONS: cleaning, laundry, community activity, and yard work  PERSONAL FACTORS: Age, Behavior pattern, Past/current experiences, Time since onset of injury/illness/exacerbation, and 1-2 comorbidities: HTN, osteoarthritis  are also affecting patient's functional outcome.   REHAB POTENTIAL: Good  CLINICAL DECISION MAKING: Stable/uncomplicated  EVALUATION COMPLEXITY: Low   GOALS: Goals reviewed with patient? Yes  SHORT TERM GOALS: Target date: 2 weeks, 05/31/23  I HEP  Baseline: Goal status: INITIAL  LONG TERM GOALS: Target date: 8 weeks 07/12/23  Baseline: QuickDASH Score: 29.5 / 100 = 29.5 % to 15% deficit Goal status: INITIAL 2.  Elimination of painful arc R shoulder for flex and abd Initial baseline, painful arc 80 to terminal range  3.  Pain free resisted R shoulder abd, baseline with pain r lat shoulder with resistance  PLAN:  PT  FREQUENCY: one time visit, will leave chart open for 8 weeks for her to call for additional appts as needed  PT DURATION: 8 weeks  PLANNED INTERVENTIONS: 97110-Therapeutic exercises, 97530- Therapeutic activity, 97112- Neuromuscular re-education, 97535- Self Care, and 60454- Manual therapy  PLAN FOR NEXT SESSION: will re evaluate if pt calls to schedule again within the next 2 months   Niemah Schwebke L Caroleena Paolini, PT, DPT, OCS 05/17/2023, 2:33 PM

## 2023-08-29 DIAGNOSIS — N3941 Urge incontinence: Secondary | ICD-10-CM | POA: Diagnosis not present

## 2023-08-29 DIAGNOSIS — I499 Cardiac arrhythmia, unspecified: Secondary | ICD-10-CM | POA: Diagnosis not present

## 2023-08-29 DIAGNOSIS — Z833 Family history of diabetes mellitus: Secondary | ICD-10-CM | POA: Diagnosis not present

## 2023-08-29 DIAGNOSIS — I10 Essential (primary) hypertension: Secondary | ICD-10-CM | POA: Diagnosis not present

## 2023-08-29 DIAGNOSIS — J302 Other seasonal allergic rhinitis: Secondary | ICD-10-CM | POA: Diagnosis not present

## 2023-08-29 DIAGNOSIS — M62838 Other muscle spasm: Secondary | ICD-10-CM | POA: Diagnosis not present

## 2023-08-29 DIAGNOSIS — Z8673 Personal history of transient ischemic attack (TIA), and cerebral infarction without residual deficits: Secondary | ICD-10-CM | POA: Diagnosis not present

## 2023-08-29 DIAGNOSIS — I2510 Atherosclerotic heart disease of native coronary artery without angina pectoris: Secondary | ICD-10-CM | POA: Diagnosis not present

## 2023-08-29 DIAGNOSIS — Z818 Family history of other mental and behavioral disorders: Secondary | ICD-10-CM | POA: Diagnosis not present

## 2023-08-29 DIAGNOSIS — R600 Localized edema: Secondary | ICD-10-CM | POA: Diagnosis not present

## 2023-08-29 DIAGNOSIS — Z809 Family history of malignant neoplasm, unspecified: Secondary | ICD-10-CM | POA: Diagnosis not present

## 2023-08-29 DIAGNOSIS — M1990 Unspecified osteoarthritis, unspecified site: Secondary | ICD-10-CM | POA: Diagnosis not present

## 2023-09-20 DIAGNOSIS — Z1331 Encounter for screening for depression: Secondary | ICD-10-CM | POA: Diagnosis not present

## 2023-09-20 DIAGNOSIS — Z23 Encounter for immunization: Secondary | ICD-10-CM | POA: Diagnosis not present

## 2023-09-20 DIAGNOSIS — Z0000 Encounter for general adult medical examination without abnormal findings: Secondary | ICD-10-CM | POA: Diagnosis not present

## 2023-09-20 DIAGNOSIS — I10 Essential (primary) hypertension: Secondary | ICD-10-CM | POA: Diagnosis not present

## 2023-09-29 DIAGNOSIS — Z780 Asymptomatic menopausal state: Secondary | ICD-10-CM | POA: Diagnosis not present

## 2023-10-06 DIAGNOSIS — M810 Age-related osteoporosis without current pathological fracture: Secondary | ICD-10-CM | POA: Diagnosis not present

## 2023-10-06 DIAGNOSIS — I10 Essential (primary) hypertension: Secondary | ICD-10-CM | POA: Diagnosis not present

## 2023-10-22 DIAGNOSIS — M4722 Other spondylosis with radiculopathy, cervical region: Secondary | ICD-10-CM | POA: Diagnosis not present

## 2023-10-22 DIAGNOSIS — M47812 Spondylosis without myelopathy or radiculopathy, cervical region: Secondary | ICD-10-CM | POA: Diagnosis not present

## 2023-10-22 DIAGNOSIS — M1610 Unilateral primary osteoarthritis, unspecified hip: Secondary | ICD-10-CM | POA: Diagnosis not present

## 2023-10-22 DIAGNOSIS — M1712 Unilateral primary osteoarthritis, left knee: Secondary | ICD-10-CM | POA: Diagnosis not present

## 2023-12-22 DIAGNOSIS — M4722 Other spondylosis with radiculopathy, cervical region: Secondary | ICD-10-CM | POA: Diagnosis not present

## 2023-12-22 DIAGNOSIS — M1610 Unilateral primary osteoarthritis, unspecified hip: Secondary | ICD-10-CM | POA: Diagnosis not present

## 2023-12-22 DIAGNOSIS — M1712 Unilateral primary osteoarthritis, left knee: Secondary | ICD-10-CM | POA: Diagnosis not present

## 2023-12-22 DIAGNOSIS — M47812 Spondylosis without myelopathy or radiculopathy, cervical region: Secondary | ICD-10-CM | POA: Diagnosis not present

## 2024-01-22 DIAGNOSIS — M47812 Spondylosis without myelopathy or radiculopathy, cervical region: Secondary | ICD-10-CM | POA: Diagnosis not present

## 2024-01-22 DIAGNOSIS — M1610 Unilateral primary osteoarthritis, unspecified hip: Secondary | ICD-10-CM | POA: Diagnosis not present

## 2024-01-22 DIAGNOSIS — M4722 Other spondylosis with radiculopathy, cervical region: Secondary | ICD-10-CM | POA: Diagnosis not present

## 2024-01-22 DIAGNOSIS — M1712 Unilateral primary osteoarthritis, left knee: Secondary | ICD-10-CM | POA: Diagnosis not present

## 2024-02-21 DIAGNOSIS — M47812 Spondylosis without myelopathy or radiculopathy, cervical region: Secondary | ICD-10-CM | POA: Diagnosis not present

## 2024-02-21 DIAGNOSIS — M1712 Unilateral primary osteoarthritis, left knee: Secondary | ICD-10-CM | POA: Diagnosis not present

## 2024-02-21 DIAGNOSIS — M1610 Unilateral primary osteoarthritis, unspecified hip: Secondary | ICD-10-CM | POA: Diagnosis not present

## 2024-02-21 DIAGNOSIS — M4722 Other spondylosis with radiculopathy, cervical region: Secondary | ICD-10-CM | POA: Diagnosis not present

## 2024-03-23 DIAGNOSIS — M4722 Other spondylosis with radiculopathy, cervical region: Secondary | ICD-10-CM | POA: Diagnosis not present

## 2024-03-23 DIAGNOSIS — M1712 Unilateral primary osteoarthritis, left knee: Secondary | ICD-10-CM | POA: Diagnosis not present

## 2024-03-23 DIAGNOSIS — M1610 Unilateral primary osteoarthritis, unspecified hip: Secondary | ICD-10-CM | POA: Diagnosis not present

## 2024-03-23 DIAGNOSIS — M47812 Spondylosis without myelopathy or radiculopathy, cervical region: Secondary | ICD-10-CM | POA: Diagnosis not present

## 2024-04-11 DIAGNOSIS — I10 Essential (primary) hypertension: Secondary | ICD-10-CM | POA: Diagnosis not present

## 2024-04-22 DIAGNOSIS — M47812 Spondylosis without myelopathy or radiculopathy, cervical region: Secondary | ICD-10-CM | POA: Diagnosis not present

## 2024-04-22 DIAGNOSIS — M1610 Unilateral primary osteoarthritis, unspecified hip: Secondary | ICD-10-CM | POA: Diagnosis not present

## 2024-04-22 DIAGNOSIS — M1712 Unilateral primary osteoarthritis, left knee: Secondary | ICD-10-CM | POA: Diagnosis not present

## 2024-04-22 DIAGNOSIS — M4722 Other spondylosis with radiculopathy, cervical region: Secondary | ICD-10-CM | POA: Diagnosis not present

## 2024-05-05 ENCOUNTER — Other Ambulatory Visit: Payer: Self-pay

## 2024-05-05 ENCOUNTER — Emergency Department (HOSPITAL_BASED_OUTPATIENT_CLINIC_OR_DEPARTMENT_OTHER)

## 2024-05-05 ENCOUNTER — Encounter (HOSPITAL_BASED_OUTPATIENT_CLINIC_OR_DEPARTMENT_OTHER): Payer: Self-pay

## 2024-05-05 ENCOUNTER — Emergency Department (HOSPITAL_BASED_OUTPATIENT_CLINIC_OR_DEPARTMENT_OTHER)
Admission: EM | Admit: 2024-05-05 | Discharge: 2024-05-05 | Disposition: A | Discharge status: Home / Self Care | Attending: Emergency Medicine | Admitting: Emergency Medicine

## 2024-05-05 DIAGNOSIS — E871 Hypo-osmolality and hyponatremia: Secondary | ICD-10-CM | POA: Insufficient documentation

## 2024-05-05 DIAGNOSIS — Z79899 Other long term (current) drug therapy: Secondary | ICD-10-CM | POA: Diagnosis not present

## 2024-05-05 DIAGNOSIS — I10 Essential (primary) hypertension: Secondary | ICD-10-CM | POA: Diagnosis not present

## 2024-05-05 DIAGNOSIS — R42 Dizziness and giddiness: Secondary | ICD-10-CM | POA: Diagnosis not present

## 2024-05-05 DIAGNOSIS — I6782 Cerebral ischemia: Secondary | ICD-10-CM | POA: Diagnosis not present

## 2024-05-05 LAB — CBC WITH DIFFERENTIAL/PLATELET
Abs Immature Granulocytes: 0.02 K/uL (ref 0.00–0.07)
Basophils Absolute: 0 K/uL (ref 0.0–0.1)
Basophils Relative: 1 %
Eosinophils Absolute: 0.1 K/uL (ref 0.0–0.5)
Eosinophils Relative: 1 %
HCT: 35 % — ABNORMAL LOW (ref 36.0–46.0)
Hemoglobin: 12.2 g/dL (ref 12.0–15.0)
Immature Granulocytes: 0 %
Lymphocytes Relative: 21 %
Lymphs Abs: 1.8 K/uL (ref 0.7–4.0)
MCH: 30.9 pg (ref 26.0–34.0)
MCHC: 34.9 g/dL (ref 30.0–36.0)
MCV: 88.6 fL (ref 80.0–100.0)
Monocytes Absolute: 0.5 K/uL (ref 0.1–1.0)
Monocytes Relative: 6 %
Neutro Abs: 6 K/uL (ref 1.7–7.7)
Neutrophils Relative %: 71 %
Platelets: 285 K/uL (ref 150–400)
RBC: 3.95 MIL/uL (ref 3.87–5.11)
RDW: 12.8 % (ref 11.5–15.5)
WBC: 8.3 K/uL (ref 4.0–10.5)
nRBC: 0 % (ref 0.0–0.2)

## 2024-05-05 LAB — COMPREHENSIVE METABOLIC PANEL WITH GFR
ALT: 20 U/L (ref 0–44)
AST: 29 U/L (ref 15–41)
Albumin: 4.4 g/dL (ref 3.5–5.0)
Alkaline Phosphatase: 81 U/L (ref 38–126)
Anion gap: 12 (ref 5–15)
BUN: 12 mg/dL (ref 8–23)
CO2: 23 mmol/L (ref 22–32)
Calcium: 9.5 mg/dL (ref 8.9–10.3)
Chloride: 94 mmol/L — ABNORMAL LOW (ref 98–111)
Creatinine, Ser: 0.65 mg/dL (ref 0.44–1.00)
GFR, Estimated: >60 mL/min (ref >60)
Glucose, Bld: 104 mg/dL — ABNORMAL HIGH (ref 70–99)
Potassium: 4.1 mmol/L (ref 3.5–5.1)
Sodium: 129 mmol/L — ABNORMAL LOW (ref 135–145)
Total Bilirubin: 0.7 mg/dL (ref 0.0–1.2)
Total Protein: 7.1 g/dL (ref 6.5–8.1)

## 2024-05-05 LAB — URINALYSIS, ROUTINE W REFLEX MICROSCOPIC
Bilirubin Urine: NEGATIVE
Glucose, UA: NEGATIVE mg/dL
Hgb urine dipstick: NEGATIVE
Ketones, ur: 15 mg/dL — AB
Leukocytes,Ua: NEGATIVE
Nitrite: NEGATIVE
Protein, ur: NEGATIVE mg/dL
Specific Gravity, Urine: 1.02 (ref 1.005–1.030)
pH: 7.5 (ref 5.0–8.0)

## 2024-05-05 LAB — TROPONIN T, HIGH SENSITIVITY: Troponin T High Sensitivity: <15 ng/L (ref 0–19)

## 2024-05-05 NOTE — ED Notes (Signed)
Pt aware of need for urine sample & unable to void at this time.

## 2024-05-05 NOTE — Discharge Instructions (Signed)
 You were seen in the emergency department for dizziness elevated blood pressure.  You had a CAT scan of your head along with lab work and urinalysis.  Your blood pressure is still moderately elevated here.  Your sodium was mildly low at 129.  Please keep a record of your blood pressures and symptoms and follow-up with your primary care doctor as they may need to adjust your medications.  Keep well-hydrated.  Return if any worsening or concerning symptoms.

## 2024-05-05 NOTE — ED Provider Notes (Signed)
 [place] [facility] Provider Note   CSN: [id number] Arrival date & time: 05/05/24  9093     Patient presents with: Dizziness and Hypertension   Maria Rowland is a 83 y.o. female.  She has a history of hypertension.  Saw her PCP about a month ago with elevated blood pressures and they added hydrochlorothiazide.  Restarted her Fosamax this morning after being out of it for a month.  Felt very fatigued this morning and a little dizzy foggy in her head.  Not a headache.  Checked her blood pressure and found it to be high.  Repeat blood pressure checks and still found them to be high so came here for further evaluation.  No chest pain or shortness of breath.  Had some nausea but that improved.  No vomiting or diarrhea no urinary symptoms.  No numbness or weakness blurry vision double vision.   The history is provided by the patient and a relative.  Dizziness Quality:  Lightheadedness Progression:  Unchanged Chronicity:  New Relieved by:  Nothing Associated symptoms: nausea   Associated symptoms: no chest pain, no diarrhea, no headaches, no palpitations, no shortness of breath, no syncope, no tinnitus, no vision changes, no vomiting and no weakness   Hypertension Pertinent negatives include no chest pain, no headaches and no shortness of breath.       Prior to Admission medications  Medication Sig Start Date End Date Taking? Authorizing Provider  Calcium Carb-Cholecalciferol (CALCIUM 500 + D PO) Take 2 tablets by mouth in the morning and at bedtime.   Yes [provider]  hydrochlorothiazide (HYDRODIURIL) 12.5 MG tablet Take 12.5 mg by mouth daily. 03/23/23  Yes [provider]  metoprolol  succinate (TOPROL -XL) 100 MG 24 hr tablet Take 50-100 mg by mouth See admin instructions. Take 100 mg in the morning and 50 mg in the evening   Yes [provider]  acetaminophen  (TYLENOL ) 650 MG CR tablet Take 1,300 mg by mouth 2 (two)  times daily.    [provider]  alendronate (FOSAMAX) 70 MG tablet Take 70 mg by mouth once a week.    [provider]  amoxicillin (AMOXIL) 500 MG capsule Take 2,000 mg by mouth See admin instructions. Take 2000 mg 1 hour prior to dental work    [provider]  Carboxymethylcellul-Glycerin (LUBRICATING EYE DROPS OP) Place 1 drop into both eyes daily as needed (dryness / allergies).    [provider]  fluticasone (FLONASE) 50 MCG/ACT nasal spray Place 1 spray into both nostrils daily as needed for allergies or rhinitis.    [provider]  fluticasone (FLONASE) 50 MCG/ACT nasal spray Place 1 spray into the nose daily.    [provider]  HYDROcodone -acetaminophen  (NORCO/VICODIN) 5-325 MG tablet Take 1-2 tablets by mouth every 6 (six) hours as needed for moderate pain or severe pain. 10/19/20   McKenzie, Kayla J, PA-C  Multiple Vitamins-Minerals (CENTRUM SILVER PO) Take 1 tablet by mouth daily.    [provider]  Psyllium (METAMUCIL PO) Take 1 Dose by mouth every evening. 1 dose = 1 teaspoon    [provider]  tiZANidine  (ZANAFLEX ) 4 MG tablet Take 4 mg by mouth every 8 (eight) hours as needed for muscle spasms.    [provider]    Allergies: Meloxicam and Caffeine    Review of Systems  HENT:  Negative for tinnitus.   Respiratory:  Negative for shortness of breath.   Cardiovascular:  Negative  for chest pain, palpitations and syncope.  Gastrointestinal:  Positive for nausea. Negative for diarrhea and vomiting.  Neurological:  Positive for dizziness. Negative for weakness and headaches.    Updated Vital Signs BP (!) 190/75 (BP Location: Left Arm)   Pulse (!) 59   Temp 98 F (36.7 C) (Oral)   Resp 17   Ht 5' 3.5 (1.613 m)   Wt 70.8 kg   SpO2 97%   BMI 27.20 kg/m   Physical Exam Vitals and nursing note reviewed.  Constitutional:      General: She is not in acute distress.    Appearance: Normal  appearance. She is well-developed.  HENT:     Head: Normocephalic and atraumatic.  Eyes:     Conjunctiva/sclera: Conjunctivae normal.  Cardiovascular:     Rate and Rhythm: Normal rate and regular rhythm.     Heart sounds: No murmur heard. Pulmonary:     Effort: Pulmonary effort is normal. No respiratory distress.     Breath sounds: Normal breath sounds. No stridor. No wheezing.  Abdominal:     Palpations: Abdomen is soft.     Tenderness: There is no abdominal tenderness. There is no guarding or rebound.  Musculoskeletal:        General: No tenderness or deformity. Normal range of motion.     Cervical back: Neck supple.  Skin:    General: Skin is warm and dry.  Neurological:     General: No focal deficit present.     Mental Status: She is alert and oriented to person, place, and time.     GCS: GCS eye subscore is 4. GCS verbal subscore is 5. GCS motor subscore is 6.     Cranial Nerves: No cranial nerve deficit.     Sensory: No sensory deficit.     Motor: No weakness.     (all labs ordered are listed, but only abnormal results are displayed) Labs Reviewed  COMPREHENSIVE METABOLIC PANEL WITH GFR - Abnormal; Notable for the following components:      Result Value   Sodium 129 (*)    Chloride 94 (*)    Glucose, Bld 104 (*)    All other components within normal limits  CBC WITH DIFFERENTIAL/PLATELET - Abnormal; Notable for the following components:   HCT 35.0 (*)    All other components within normal limits  URINALYSIS, ROUTINE W REFLEX MICROSCOPIC - Abnormal; Notable for the following components:   Ketones, ur 15 (*)    All other components within normal limits  TROPONIN T, HIGH SENSITIVITY    EKG: EKG Interpretation Date/Time:  Saturday May 05 2024 11:37:29 EST Ventricular Rate:  56 PR Interval:  174 QRS Duration:  78 QT Interval:  421 QTC Calculation: 407 R Axis:   5  Text Interpretation: Sinus rhythm No significant change since prior 5/22 Confirmed by  Towana Sharper [phone]) on 05/05/2024 11:47:23 AM  Radiology: CT Head Wo Contrast Result Date: 05/05/2024 EXAM: CT HEAD WITHOUT 05/05/2024 10:47:00 AM TECHNIQUE: CT of the head was performed without the administration of intravenous contrast. Automated exposure control, iterative reconstruction, and/or weight based adjustment of the mA/kV was utilized to reduce the radiation dose to as low as reasonably achievable. COMPARISON: None available. CLINICAL HISTORY: Vertigo, central. FINDINGS: BRAIN AND VENTRICLES: There is no evidence of an acute infarct, intracranial hemorrhage, mass, midline shift, hydrocephalus, or extra-axial fluid collection. Cerebral volume is within normal limits for age. Patchy hypodensities in the cerebral white matter bilaterally are unchanged and nonspecific but  compatible with moderate chronic small vessel ischemic disease. Calcified atherosclerosis at the skull base. ORBITS: Bilateral cataract extraction. SINUSES AND MASTOIDS: Minimal mucosal thickening laterally in the left sphenoid sinus. Clear mastoid air cells. SOFT TISSUES AND SKULL: No acute skull fracture. No acute soft tissue abnormality. IMPRESSION: 1. No acute intracranial abnormality. 2. Moderate chronic small vessel ischemic disease. Electronically signed by: Dasie Hamburg MD 05/05/2024 11:08 AM EST RP Workstation: [id number]     Procedures   Medications Ordered in the ED - No data to display                                  Medical Decision Making Amount and/or Complexity of Data Reviewed Labs: ordered. Radiology: ordered.   This patient complains of high blood pressure dizziness; this involves an extensive number of treatment Options and is a complaint that carries with it a high risk of complications and morbidity. The differential includes stroke, bleed, hypertensive urgency, metabolic derangement  I ordered, reviewed and interpreted labs, which included CBC normal chemistries with mildly low sodium  urinalysis without signs of infection, troponins flat I ordered imaging studies which included head CT and I independently    visualized and interpreted imaging which showed no acute findings Additional history obtained from patient's daughter Previous records obtained and reviewed in epic including recent PCP notes Cardiac monitoring reviewed, sinus rhythm Social determinants considered, no significant barriers Critical Interventions: None  After the interventions stated above, I reevaluated the patient and found patient to currently be asymptomatic in no distress Admission and further testing considered, no indications for admission at this time.  Recommended keeping a record of her blood pressures and following up with PCP for further med adjustment.  Return instructions discussed.      Final diagnoses:  Dizziness  Primary hypertension  Hyponatremia    ED Discharge Orders     None          Towana Ozell BROCKS, MD 05/05/24 [phone]

## 2024-05-05 NOTE — ED Triage Notes (Signed)
 Arrives POV with complaints of elevated blood pressure (180's-190's systolic) and dizziness that worsened overnight. Patient is compliant with her home medications.

## 2024-05-05 NOTE — ED Notes (Signed)
 Taken on R arm after taking a manual on the L arm. See vital flowsheet.

## 2024-05-05 NOTE — ED Notes (Signed)
 Pt is entirely asymptomatic during orthostatic assessment. No weakness, numbness, tingling, dizziness, or worsening headache. Pt's only complaint was chronic neck tightness.

## 2024-05-08 DIAGNOSIS — T887XXS Unspecified adverse effect of drug or medicament, sequela: Secondary | ICD-10-CM | POA: Diagnosis not present

## 2024-05-08 DIAGNOSIS — I6789 Other cerebrovascular disease: Secondary | ICD-10-CM | POA: Diagnosis not present

## 2024-05-08 DIAGNOSIS — I10 Essential (primary) hypertension: Secondary | ICD-10-CM | POA: Diagnosis not present

## 2024-05-08 DIAGNOSIS — G44209 Tension-type headache, unspecified, not intractable: Secondary | ICD-10-CM | POA: Diagnosis not present

## 2024-06-06 ENCOUNTER — Emergency Department (HOSPITAL_BASED_OUTPATIENT_CLINIC_OR_DEPARTMENT_OTHER)
Admission: EM | Admit: 2024-06-06 | Discharge: 2024-06-06 | Disposition: A | Discharge status: Home / Self Care | Attending: Emergency Medicine | Admitting: Emergency Medicine

## 2024-06-06 ENCOUNTER — Encounter (HOSPITAL_BASED_OUTPATIENT_CLINIC_OR_DEPARTMENT_OTHER): Payer: Self-pay | Admitting: Emergency Medicine

## 2024-06-06 ENCOUNTER — Other Ambulatory Visit: Payer: Self-pay

## 2024-06-06 ENCOUNTER — Emergency Department (HOSPITAL_BASED_OUTPATIENT_CLINIC_OR_DEPARTMENT_OTHER)

## 2024-06-06 DIAGNOSIS — I10 Essential (primary) hypertension: Secondary | ICD-10-CM | POA: Insufficient documentation

## 2024-06-06 DIAGNOSIS — S0990XA Unspecified injury of head, initial encounter: Secondary | ICD-10-CM | POA: Diagnosis present

## 2024-06-06 DIAGNOSIS — W108XXA Fall (on) (from) other stairs and steps, initial encounter: Secondary | ICD-10-CM | POA: Diagnosis not present

## 2024-06-06 DIAGNOSIS — M25552 Pain in left hip: Secondary | ICD-10-CM | POA: Diagnosis not present

## 2024-06-06 DIAGNOSIS — Z79899 Other long term (current) drug therapy: Secondary | ICD-10-CM | POA: Insufficient documentation

## 2024-06-06 DIAGNOSIS — W19XXXA Unspecified fall, initial encounter: Secondary | ICD-10-CM

## 2024-06-06 MED ORDER — ACETAMINOPHEN 325 MG PO TABS
650.0000 mg | ORAL_TABLET | Freq: Once | ORAL | Status: AC
  Administered 2024-06-06: 650 mg via ORAL
  Filled 2024-06-06: qty 2

## 2024-06-06 NOTE — ED Triage Notes (Signed)
 Fell on Monday landed omn left side and had her hip replaced that side and she is worried  about and iot hurts to walk

## 2024-06-06 NOTE — Discharge Instructions (Signed)
 It was a pleasure taking care of you today.  As discussed, your imaging was unremarkable.  You do have severe arthritis of the right hip.  I have included the number to the orthopedic surgeon here in town.  Please call to schedule an appointment for further evaluation.  You may take over-the-counter ibuprofen or Tylenol  as needed for pain. Return

## 2024-06-06 NOTE — ED Provider Notes (Signed)
 " [facility] Provider Note   CSN: [id number] Arrival date & time: 06/06/24  1111     Patient presents with: No chief complaint on file.   Maria Rowland is a 84 y.o. female with a past medical history significant for hypertension who presents to the ED after a mechanical fall that occurred 2 days ago.  Patient states she missed a step causing her to fall on her left side.  Admits to hitting her head.  No LOC.  Not on any blood thinners.  Patient admits to left hip pain.  Had a previous left hip arthroplasty numerous years ago.  Denies numbness/tingling.  No weakness of lower extremities.  Also admits to some left shoulder pain.  No chest pain or shortness of breath.  Denies any visual or speech changes.  No unilateral weakness.  Denies nausea and vomiting.  No other injuries.  History obtained from patient and past medical records. No interpreter used during encounter.       Prior to Admission medications  Medication Sig Start Date End Date Taking? Authorizing Provider  hydrochlorothiazide (HYDRODIURIL) 25 MG tablet Take 25 mg by mouth every morning. 04/11/24  Yes [provider]  olmesartan (BENICAR) 40 MG tablet Take 40 mg by mouth daily. 05/19/24  Yes [provider]  acetaminophen  (TYLENOL ) 650 MG CR tablet Take 1,300 mg by mouth 2 (two) times daily.    [provider]  alendronate (FOSAMAX) 70 MG tablet Take 70 mg by mouth once a week.    [provider]  amoxicillin (AMOXIL) 500 MG capsule Take 2,000 mg by mouth See admin instructions. Take 2000 mg 1 hour prior to dental work    [provider]  Calcium Carb-Cholecalciferol (CALCIUM 500 + D PO) Take 2 tablets by mouth in the morning and at bedtime.    [provider]  Carboxymethylcellul-Glycerin (LUBRICATING EYE DROPS OP) Place 1 drop into both eyes daily as needed (dryness / allergies).    [provider]  fluticasone (FLONASE)  50 MCG/ACT nasal spray Place 1 spray into both nostrils daily as needed for allergies or rhinitis.    [provider]  fluticasone (FLONASE) 50 MCG/ACT nasal spray Place 1 spray into the nose daily.    [provider]  hydrochlorothiazide (HYDRODIURIL) 12.5 MG tablet Take 12.5 mg by mouth daily. 03/23/23   [provider]  HYDROcodone -acetaminophen  (NORCO/VICODIN) 5-325 MG tablet Take 1-2 tablets by mouth every 6 (six) hours as needed for moderate pain or severe pain. 10/19/20   McKenzie, Kayla J, PA-C  metoprolol  succinate (TOPROL -XL) 100 MG 24 hr tablet Take 50-100 mg by mouth See admin instructions. Take 100 mg in the morning and 50 mg in the evening    [provider]  Multiple Vitamins-Minerals (CENTRUM SILVER PO) Take 1 tablet by mouth daily.    [provider]  Psyllium (METAMUCIL PO) Take 1 Dose by mouth every evening. 1 dose = 1 teaspoon    [provider]  tiZANidine  (ZANAFLEX ) 4 MG tablet Take 4 mg by mouth every 8 (eight) hours as needed for muscle spasms.    [provider]    Allergies: Meloxicam and Caffeine    Review of Systems  Respiratory:  Negative for shortness of breath.   Cardiovascular:  Negative for chest pain.  Musculoskeletal:  Positive for arthralgias.  Neurological:  Negative for weakness and headaches.    Updated Vital Signs BP (!) 178/86 (BP Location: Right Arm)  Pulse (!) 58   Temp (!) 97.5 F (36.4 C) (Oral)   Resp 17   Ht 5' 3 (1.6 m)   Wt 71.2 kg   SpO2 92%   BMI 27.81 kg/m   Physical Exam Vitals and nursing note reviewed.  Constitutional:      General: She is not in acute distress.    Appearance: She is not ill-appearing.  HENT:     Head: Normocephalic.  Eyes:     Pupils: Pupils are equal, round, and reactive to light.  Cardiovascular:     Rate and Rhythm: Normal rate and regular rhythm.     Pulses: Normal pulses.     Heart sounds: Normal heart sounds. No murmur heard.    No  friction rub. No gallop.  Pulmonary:     Effort: Pulmonary effort is normal.     Breath sounds: Normal breath sounds.  Abdominal:     General: Abdomen is flat. There is no distension.     Palpations: Abdomen is soft.     Tenderness: There is no abdominal tenderness. There is no guarding or rebound.  Musculoskeletal:        General: Normal range of motion.     Cervical back: Neck supple.     Comments: Bony tenderness of left hip.  Able to ambulate without difficulty.  Left lower extremity neurovascularly intact with soft compartments.  No thoracic or lumbar midline tenderness.  Skin:    General: Skin is warm and dry.  Neurological:     General: No focal deficit present.     Mental Status: She is alert.     Comments: Speech is clear, able to follow commands CN III-XII intact Normal strength in upper and lower extremities bilaterally including dorsiflexion and plantar flexion, strong and equal grip strength Sensation grossly intact throughout Moves extremities without ataxia, coordination intact No pronator drift Ambulates without difficulty  Psychiatric:        Mood and Affect: Mood normal.        Behavior: Behavior normal.     (all labs ordered are listed, but only abnormal results are displayed) Labs Reviewed - No data to display  EKG: None  Radiology: CT Cervical Spine Wo Contrast Result Date: 06/06/2024 EXAM: CT CERVICAL SPINE WITHOUT CONTRAST 06/06/2024 02:31:37 PM TECHNIQUE: CT of the cervical spine was performed without the administration of intravenous contrast. Multiplanar reformatted images are provided for review. Automated exposure control, iterative reconstruction, and/or weight based adjustment of the mA/kV was utilized to reduce the radiation dose to as low as reasonably achievable. COMPARISON: None available. CLINICAL HISTORY: Neck trauma (Age >= 65y) FINDINGS: BONES AND ALIGNMENT: Straightening of the normal cervical lordosis. There is trace degenerative  anterolisthesis of C4 on C5. There is fusion of the C5 and C6 vertebral bodies. Additional trace degenerative anterolisthesis of C7 on T1. No evidence of traumatic malalignment. Diffuse osteopenia. No compression fracture or displaced fracture in the cervical spine. DEGENERATIVE CHANGES: Disc space narrowing most pronounced at C6-C7. Additional moderate disc space narrowing at C3-C4. Degenerative endplate osteophytes at multiple levels. Disc osteophyte complex is greatest at C5-C6 and C6-C7 without high grade osseous spinal canal stenosis. Arthrosis and uncovertebral hypertrophy at multiple levels throughout the cervical spine. Foraminal stenosis most pronounced on the left at C3-C4 and C4-C5 and bilaterally at C5-C6 and C6-C7. SOFT TISSUES: No prevertebral soft tissue swelling. IMPRESSION: 1. No evidence of acute traumatic injury. Electronically signed by: Donnice Mania MD 06/06/2024 03:11 PM EST RP Workstation: [id number]  CT Head Wo Contrast Result Date: 06/06/2024 EXAM: CT HEAD WITHOUT CONTRAST 06/06/2024 02:31:37 PM TECHNIQUE: CT of the head was performed without the administration of intravenous contrast. Automated exposure control, iterative reconstruction, and/or weight based adjustment of the mA/kV was utilized to reduce the radiation dose to as low as reasonably achievable. COMPARISON: 05/05/2024 CLINICAL HISTORY: Head trauma, minor (Age >= 65y). Minor head trauma in a patient aged 80 years or older. FINDINGS: BRAIN AND VENTRICLES: No acute hemorrhage. No evidence of acute infarct. No hydrocephalus. No extra-axial collection. No mass effect or midline shift. Patchy and confluent decreased attenuation throughout the deep and periventricular white matter of bilateral cerebral hemispheres, compatible with chronic microvascular ischemic disease. Atherosclerotic calcifications within the cavernous internal carotid arteries. ORBITS: No acute abnormality. Bilateral lens replacement. SINUSES: No acute  abnormality. SOFT TISSUES AND SKULL: Left parietal scalp soft tissue hematoma. No skull fracture. IMPRESSION: 1. No acute intracranial abnormality. 2. Left parietal scalp soft tissue hematoma. Electronically signed by: Donnice Mania MD 06/06/2024 02:49 PM EST RP Workstation: [id number]   DG Chest 2 View Result Date: 06/06/2024 EXAM: 1 VIEW(S) XRAY OF THE CHEST 06/06/2024 12:37:00 PM COMPARISON: None available. CLINICAL HISTORY: The patient experienced a fall and reports shortness of breath. ICD10 code [id number]: Shortness of breath. FINDINGS: LUNGS AND PLEURA: No focal pulmonary opacity. No pleural effusion. No pneumothorax. HEART AND MEDIASTINUM: Aortic atherosclerosis (ICD10-170.0). Tortuous aorta. Borderline cardiomegaly. BONES AND SOFT TISSUES: Multilevel thoracic osteophytosis. No acute osseous abnormality. IMPRESSION: 1. Borderline cardiomegaly. No acute cardiopulmonary abnormality. Electronically signed by: Rogelia Myers MD 06/06/2024 01:19 PM EST RP Workstation: [id number]   DG Hip Unilat W or Wo Pelvis 2-3 Views Left Result Date: 06/06/2024 EXAM: 2 or 3 VIEW(S) XRAY OF THE UNILATERAL HIP 06/06/2024 12:31:00 PM COMPARISON: None available. CLINICAL HISTORY: The patient experienced a fall. FINDINGS: BONES AND JOINTS: Left total hip arthroplasty is anatomically aligned without dislocation. No periprosthetic lucency to suggest loosening. Worsening osteoarthritis of the right hip with bone and bone articulation along the superior aspect of the acetabulum. No acute fracture. LUMBAR SPINE: Degenerative changes of the visualized lower lumbar spine. SOFT TISSUES: Unremarkable. IMPRESSION: 1. Left total hip arthroplasty is anatomically aligned without dislocation. No acute fracture. 2. Worsening osteoarthritis of the right hip with near bone-on-bone articulation along the superior aspect of the acetabulum. Electronically signed by: Rogelia Myers MD 06/06/2024 01:08 PM EST RP Workstation: [id number]      Procedures   Medications Ordered in the ED  acetaminophen  (TYLENOL ) tablet 650 mg (650 mg Oral Given 06/06/24 1415)                                    Medical Decision Making Amount and/or Complexity of Data Reviewed Radiology: ordered and independent interpretation performed. Decision-making details documented in ED Course.  Risk OTC drugs.   This patient presents to the ED for concern of fall, this involves an extensive number of treatment options, and is a complaint that carries with it a high risk of complications and morbidity.  The differential diagnosis includes intracranial bleed, bony fracture, dislocation, etc  84 year old female presents to the ED after a mechanical fall that occurred 2 days ago.  Patient missed a step landing on her left side.  Had a previous left hip arthroplasty.  Admits to hitting her head.  No LOC.  Not on any blood thinners.  Upon arrival, stable vitals.  Patient well-appearing on exam.  Normal  neurological exam without any neurological deficits.  No tenderness throughout left shoulder or left side of ribs.  Does have some bony tenderness to left hip.  No thoracic or lumbar midline tenderness.  Patient able to ambulate without difficulty in the room.  Chest x-ray and hip x-ray ordered in triage.  Added CT head and cervical spine.  Tylenol  given.  Chest x-ray personally reviewed and interpreted which demonstrates borderline cardiomegaly.  No evidence of rib fracture or other acute abnormalities.  Hip x-ray negative for bony fracture.  Given patient is able to ambulate lower suspicion for occult fracture so will hold off on CT imaging at this time.  CT head demonstrates a left parietal scalp soft tissue hematoma.  No intracranial bleed. CT cervical spine negative for any bony fracture.  Patient able to ambulate without difficulty.  Advised to take over-the-counter ibuprofen or Tylenol  as needed for pain.  Orthopedics number given to patient at discharge and  advised to call to schedule an appointment for further evaluation of her hip pain.  Low suspicion for any traumatic injuries.  Patient stable for discharge. Strict ED precautions discussed with patient. Patient states understanding and agrees to plan. Patient discharged home in no acute distress and stable vitals  Discussed with Dr. Elnor who agrees with assessment and plan  Co morbidities that complicate the patient evaluation  HTN  Social Determinants of Health:  Elderly >65  Test / Admission - Considered:  Considered admission however, felt patient was stable for discharge given her reassuring images.  No intracranial bleed.  Low suspicion for any traumatic injuries.      Final diagnoses:  Fall, initial encounter  Injury of head, initial encounter  Left hip pain    ED Discharge Orders     None          Lorelle Aleck BROCKS, PA-C 06/06/24 1539    Elnor Savant A, DO 06/07/24 [phone]  "
